# Patient Record
Sex: Male | Born: 1935 | Race: White | Hispanic: No | Marital: Married | Smoking: Former smoker
Health system: Southern US, Community
[De-identification: ages and names within clinical notes are randomized; demographics above are authoritative.]

## PROBLEM LIST (undated history)

## (undated) DIAGNOSIS — I2510 Atherosclerotic heart disease of native coronary artery without angina pectoris: Secondary | ICD-10-CM

## (undated) DIAGNOSIS — E785 Hyperlipidemia, unspecified: Secondary | ICD-10-CM

## (undated) DIAGNOSIS — N184 Chronic kidney disease, stage 4 (severe): Secondary | ICD-10-CM

## (undated) DIAGNOSIS — I10 Essential (primary) hypertension: Secondary | ICD-10-CM

## (undated) DIAGNOSIS — I5043 Acute on chronic combined systolic (congestive) and diastolic (congestive) heart failure: Secondary | ICD-10-CM

## (undated) HISTORY — PX: CATARACT EXTRACTION: SUR2

## (undated) HISTORY — PX: AV FISTULA PLACEMENT, RADIOCEPHALIC: SHX1208

---

## 2001-10-11 ENCOUNTER — Ambulatory Visit (HOSPITAL_COMMUNITY): Admission: RE | Admit: 2001-10-11 | Discharge: 2001-10-11 | Payer: Self-pay | Admitting: Nephrology

## 2001-10-11 ENCOUNTER — Encounter: Payer: Self-pay | Admitting: Nephrology

## 2001-10-26 ENCOUNTER — Ambulatory Visit (HOSPITAL_COMMUNITY): Admission: RE | Admit: 2001-10-26 | Discharge: 2001-10-26 | Payer: Self-pay | Admitting: Nephrology

## 2001-10-26 ENCOUNTER — Encounter: Payer: Self-pay | Admitting: Nephrology

## 2007-04-28 ENCOUNTER — Ambulatory Visit: Payer: Self-pay | Admitting: *Deleted

## 2008-05-03 ENCOUNTER — Ambulatory Visit: Payer: Self-pay | Admitting: *Deleted

## 2008-11-12 ENCOUNTER — Ambulatory Visit: Payer: Self-pay | Admitting: Surgery

## 2008-11-30 ENCOUNTER — Ambulatory Visit: Payer: Self-pay | Admitting: Surgery

## 2008-11-30 ENCOUNTER — Ambulatory Visit (HOSPITAL_COMMUNITY): Admission: RE | Admit: 2008-11-30 | Discharge: 2008-11-30 | Payer: Self-pay | Admitting: Surgery

## 2008-12-24 ENCOUNTER — Ambulatory Visit: Payer: Self-pay | Admitting: Surgery

## 2009-01-28 ENCOUNTER — Ambulatory Visit: Payer: Self-pay | Admitting: Surgery

## 2009-06-19 ENCOUNTER — Ambulatory Visit: Payer: Self-pay | Admitting: Vascular Surgery

## 2010-08-28 ENCOUNTER — Ambulatory Visit
Admission: RE | Admit: 2010-08-28 | Discharge: 2010-08-28 | Payer: Self-pay | Source: Home / Self Care | Attending: Vascular Surgery | Admitting: Vascular Surgery

## 2010-11-26 LAB — POCT I-STAT 4, (NA,K, GLUC, HGB,HCT)
Glucose, Bld: 138 mg/dL — ABNORMAL HIGH (ref 70–99)
HCT: 38 % — ABNORMAL LOW (ref 39.0–52.0)
Hemoglobin: 12.9 g/dL — ABNORMAL LOW (ref 13.0–17.0)
Potassium: 4.3 mEq/L (ref 3.5–5.1)
Sodium: 140 mEq/L (ref 135–145)

## 2010-12-30 NOTE — Assessment & Plan Note (Signed)
OFFICE VISIT   Tanner Pearson, Tanner Pearson  DOB:  [birth year]                                       12/24/2008  [id number]   REASON FOR VISIT:  Follow-up.   HISTORY:  This is a 77 year old gentleman who underwent right  radiocephalic fistula on November 30, 2008.  He is not yet on dialysis.  His postoperative course was uneventful.  He comes in today for  evaluation.  Has an excellent thrill with a prominent vein in his  forearm.  His incision is well healed.  The patient does complain of  some mild numbness in the tips of his lateral 3 digits; this occurs only  at night and is improving.  He also has had some mild amount of pain in  his right wrist.   I think the patient's symptoms may be related to a mild steal; however,  I do not think this is a significant.  I think it will improve with  time.  I have recommend that he do regular exercises.  I am going to see  him back in 4 weeks.  I think this is going to be an excellent access  for him.   Eldridge Abrahams, MD  Electronically Signed   VWB/MEDQ  D:  12/24/2008  T:  12/25/2008  Job:  [id number]   cc:   Elzie Rings. [name], M.D.

## 2010-12-30 NOTE — Procedures (Signed)
DUPLEX ULTRASOUND OF ABDOMINAL AORTA   INDICATION:  Follow up of known infrarenal AAA.   HISTORY:  Diabetes:  No  Cardiac:  No  Hypertension:  Yes  Smoking:  One-half pack per day  Connective Tissue Disorder:  Family History:  No  Previous Surgery:  No   DUPLEX EXAM:         AP (cm)                   TRANSVERSE (cm)  Proximal             2.1 Cm                    2.2 cm  Mid                  2.6 cm                    3 cm  Distal               1.8 cm                    2.3 cm  Right Iliac          1.2 cm                    1.3 cm  Left Iliac           1.2 cm                    1.1 cm   PREVIOUS:  Date: 04/2006  AP:  2.6  TRANSVERSE:  3   IMPRESSION:  1. Stable-appearing fusiform abdominal aortic aneurysm with maximum      diameter of 2.6 cm AP x 3      cm transverse.  There is mild plaquing without significant flow      obstruction.  2. Mild ectatic common iliac arteries.   ___________________________________________  P. Drucie Opitz, M.D.   AR/MEDQ  D:  04/28/2007  T:  04/28/2007  Job:  [id number]

## 2010-12-30 NOTE — Assessment & Plan Note (Signed)
OFFICE VISIT   Tanner Pearson, Tanner Pearson  DOB:  01-Dec-1928                                       11/12/2008  [id number]   REFERRING PHYSICIAN:  Caren Griffins B. [name], M.D.   REASON FOR VISIT:  Dialysis access.   HISTORY:  This is a 77 year old gentleman I am seeing at the request of  Dr. [name] for evaluation of permanent access.  The patient his  predominately left-handed.  He is not yet on dialysis.  Etiology for his  dialysis is multi-factorial including hypertension and dyslipidemia.   The patient has a history of chronic kidney disease, hypertension,  dyslipidemia, abdominal aortic aneurysm, tobacco abuse.   FAMILY HISTORY:  Noncontributory.   SOCIAL HISTORY:  He is married.  Currently smokes half pack a day.   REVIEW OF SYSTEMS:  GENERAL:  Negative fevers, chills, weight gain,  weight loss.  CARDIAC:  Negative.  PULMONARY:  Negative.  GI:  Negative.  GU:  Positive for kidney disease.  VASCULAR:  Negative.  NEURO:  Negative.  ORTHO:  Negative.  PSYCH:  Negative.  ENT:  Negative.  HEME:  Negative.   MEDICATIONS:  Include Hyzaar, amlodipine and Tricor.   ALLERGIES:  None.   PHYSICAL EXAMINATION:  VITAL SIGNS:  Blood pressure is 155/69, pulse 62.  GENERAL:  Well-appearing, no distress.  CARDIOVASCULAR:  Regular rate and rhythm.  RESPIRATORY:  Respirations nonlabored.  EXTREMITIES:  He has palpable right radial pulse.  NEUROLOGIC:  He is intact.   DIAGNOSTIC STUDIES:  Ultrasound was performed today.  He has excellent  right-sided cephalic vein.   ASSESSMENT/PLAN:  Chronic kidney disease needing access.  Patient is a  good candidate for a right wrist fistula.  I have scheduled this for  Friday, April 16.  The risks and benefits were discussed with the  patient and his wife.  All questions were answered.  Again, he will be  scheduled for a right wrist fistula.   Eldridge Abrahams, MD  Electronically Signed   VWB/MEDQ  D:  11/12/2008  T:   11/13/2008  Job:  [id number]   cc:   Elzie Rings. [name], M.D.

## 2010-12-30 NOTE — Procedures (Signed)
DUPLEX ULTRASOUND OF ABDOMINAL AORTA   INDICATION:  Follow-up of abdominal aortic aneurysm.   HISTORY:  Diabetes:  No  Cardiac:  No  Hypertension:  Yes  Smoking:  Yes  Connective Tissue Disorder:  Family History:  No  Previous Surgery:  No   DUPLEX EXAM:         AP (cm)                   TRANSVERSE (cm)  Proximal             1.92 cm                   1.98 cm  Mid                  2.31 cm                   2.15 cm  Distal               2.83 cm                   3.05 cm  Right Iliac          1.54 cm                   1.48 cm  Left Iliac           1.48 cm                   1.52 cm   PREVIOUS:  Date: May 03, 2008  AP:  3.1  TRANSVERSE:  2.9   IMPRESSION:  Stable small abdominal aortic aneurysm with largest  measurement of 2.83 cm X 3.05 cm.   ___________________________________________  Judeth Cornfield. Scot Dock, M.D.   AS/MEDQ  D:  06/19/2009  T:  06/19/2009  Job:  [id number]

## 2010-12-30 NOTE — Procedures (Signed)
DUPLEX ULTRASOUND OF ABDOMINAL AORTA   INDICATION:  Followup abdominal ectasia/aneurysm.   HISTORY:  Diabetes:  No  Cardiac:  No  Hypertension:  Yes  Smoking:  Yes  Connective Tissue Disorder:  Family History:  Previous Surgery:  No   DUPLEX EXAM:         AP (cm)                   TRANSVERSE (cm)  Proximal             2.37 x 2.3 cm             cm  Mid                  2.8 x 2.9 cm              cm  Distal               1.73 x 1.76 cm            cm  Right Iliac          0.98 x 1.1 cm             cm  Left Iliac           1.3 x 1.2 cm              cm   PREVIOUS:  Date: June 19, 2009  AP:  2.83 x 3.05  TRANSVERSE:   IMPRESSION:  Evidence of stable mildly ectatic abdominal aortic.   ___________________________________________  Judeth Cornfield. Scot Dock, M.D.   LT/MEDQ  D:  08/28/2010  T:  08/28/2010  Job:  [id number]

## 2010-12-30 NOTE — Assessment & Plan Note (Signed)
OFFICE VISIT   Tanner Pearson, Tanner Pearson  DOB:  [birth year]                                       01/28/2009  [id number]   REASON FOR VISIT:  Followup.   HISTORY:  This is a 77 year old gentleman who is status post right  radiocephalic fistula on November 30, 2008.  He comes in today for  followup.  He had a mild steal syndrome with numbness in the lateral 3  digits; however, this is completely resolved.  He has an excellent  thrill and a nicely-dilated vein.  This will be an excellent access for  him should he require dialysis.  I will not plan on seeing him back in  the office.   Eldridge Abrahams, MD  Electronically Signed   VWB/MEDQ  D:  01/27/2009  T:  01/29/2009  Job:  [id number]

## 2010-12-30 NOTE — Op Note (Signed)
NAME:  Tanner Pearson, Tanner Pearson                ACCOUNT NO.:  [id number]   MEDICAL RECORD NO.:  [id number]          PATIENT TYPE:  AMB   LOCATION:  SDS                          FACILITY:  [facility]   PHYSICIAN:  Theotis Burrow IV, MDDATE OF BIRTH:  May 03, 1929   DATE OF PROCEDURE:  11/30/2008  DATE OF DISCHARGE:                               OPERATIVE REPORT   PREOPERATIVE DIAGNOSIS:  Chronic kidney disease.   POSTOPERATIVE DIAGNOSIS:  Chronic kidney disease.   PROCEDURE PERFORMED:  Right radiocephalic fistula ([place]).   SURGEON:  1. Annamarie Major IV, MD   ANESTHESIA:  General.   ESTIMATED BLOOD LOSS:  Minimal.   FINDINGS:  Small artery.   INDICATIONS:  This is a 77 year old gentleman with chronic kidney  disease comes in today for dialysis access.   PROCEDURE IN DETAIL:  The patient was identified in the holding area,  taken to room 8, and he was placed supine on the table.  General  anesthesia was administered.  The patient was prepped and draped in the  standard sterile fashion.  Time-out was called.  Antibiotics were given.  Ultrasound was used to map the course of the cephalic vein via the upper  arm.  It appeared to be of adequate diameter.  A longitudinal incision  was made between the cephalic vein and the radial artery.  Cephalic vein  was identified within the incision, skeletonized and encircled with  vessel loop.  This was mobilized proximally and distally.  The side  branches were divided between 3-0 silk ties.  Once the adequate length  of the vein was obtained, it was marked with an ink pen to maintain a  proper orientation.  Next, the radial artery was identified within the  incision.  The radial artery was very small in caliber, approximately  1.5-2 mm.  Once the radial artery had been adequately exposed, the  patient was given 3000 units of heparin.  The vein was ligated on this  distal limb with 2-0 silk tie.  It was opened and spatulated.  The vein  accepted up to a  3.5 dilator.  A #11 blade was used make an arteriotomy  and was extended with Potts scissors.  An end-to-side anastomosis was  created using a running 7-0 Prolene.  Prior to completion, the artery  was flushed in antegrade and retrograde fashion.  The anastomosis was  then secured.  I was not very happy with the thrill within the fistula.  It did  have an adequate sounding Doppler signal up through the elbow.  The  patient was hypotensive throughout the case.  I have elected to observe  this every time since he is not on dialysis.  Hopefully, this will end  up maturing.  If it occludes early, it would need to be converted to an  upper arm fistula.  The skin was closed with 3-0 and 4-0 Vicryl.      Eldridge Abrahams, MD  Electronically Signed     VWB/MEDQ  D:  11/30/2008  T:  12/01/2008  Job:  [id number]

## 2010-12-30 NOTE — Procedures (Signed)
CEPHALIC VEIN MAPPING   INDICATION:  Preop evaluation for possible AV fistula placement.   HISTORY:  Chronic kidney disease.   EXAM:  The right cephalic vein is compressible.   Diameter measurements range from 0.3-0.41 cm.   The left cephalic vein is compressible.   Diameter measurements range from 0.2-0.65 cm.   See attached worksheet for all measurements.   IMPRESSION:  Patent bilateral cephalic veins with diameter measurements  as described above and on the attached worksheet.   ___________________________________________  V. Leia Alf, MD   CH/MEDQ  D:  11/12/2008  T:  11/12/2008  Job:  [id number]

## 2010-12-30 NOTE — Procedures (Signed)
DUPLEX ULTRASOUND OF ABDOMINAL AORTA   INDICATION:  Follow-up evaluation of known abdominal aortic aneurysm.   HISTORY:  Diabetes:  No.  Cardiac:  No.  Hypertension:  Yes.  Smoking:  A half pack per day.  Connective Tissue Disorder:  Family History:  No.  Previous Surgery:  No.   DUPLEX EXAM:         AP (cm)                   TRANSVERSE (cm)  Proximal             1.7 Cm                    1.6 cm  Mid                  3.1 cm                    2.9 cm  Distal               2.1 cm                    2.3 cm  Right Iliac          1.0 cm                    1.1 cm  Left Iliac           1.0 cm                    1.1 cm   PREVIOUS:  Date: 04/28/07  AP:  2.6  TRANSVERSE:  3.0   IMPRESSION:  Abdominal aortic aneurysm measurements are stable from  previous study.   ___________________________________________  P. Drucie Opitz, M.D.   MC/MEDQ  D:  05/03/2008  T:  05/03/2008  Job:  [id number]

## 2011-08-27 DIAGNOSIS — R197 Diarrhea, unspecified: Secondary | ICD-10-CM | POA: Diagnosis not present

## 2011-08-28 DIAGNOSIS — R197 Diarrhea, unspecified: Secondary | ICD-10-CM | POA: Diagnosis not present

## 2011-09-02 DIAGNOSIS — N19 Unspecified kidney failure: Secondary | ICD-10-CM | POA: Diagnosis not present

## 2011-09-07 DIAGNOSIS — N184 Chronic kidney disease, stage 4 (severe): Secondary | ICD-10-CM | POA: Diagnosis not present

## 2011-10-01 DIAGNOSIS — N184 Chronic kidney disease, stage 4 (severe): Secondary | ICD-10-CM | POA: Diagnosis not present

## 2011-10-01 DIAGNOSIS — E559 Vitamin D deficiency, unspecified: Secondary | ICD-10-CM | POA: Diagnosis not present

## 2011-10-01 DIAGNOSIS — M109 Gout, unspecified: Secondary | ICD-10-CM | POA: Diagnosis not present

## 2011-10-01 DIAGNOSIS — I129 Hypertensive chronic kidney disease with stage 1 through stage 4 chronic kidney disease, or unspecified chronic kidney disease: Secondary | ICD-10-CM | POA: Diagnosis not present

## 2011-10-29 DIAGNOSIS — K219 Gastro-esophageal reflux disease without esophagitis: Secondary | ICD-10-CM | POA: Diagnosis not present

## 2011-11-17 DIAGNOSIS — A048 Other specified bacterial intestinal infections: Secondary | ICD-10-CM | POA: Diagnosis not present

## 2011-11-18 DIAGNOSIS — A048 Other specified bacterial intestinal infections: Secondary | ICD-10-CM | POA: Diagnosis not present

## 2011-11-25 DIAGNOSIS — R634 Abnormal weight loss: Secondary | ICD-10-CM | POA: Diagnosis not present

## 2011-11-25 DIAGNOSIS — A048 Other specified bacterial intestinal infections: Secondary | ICD-10-CM | POA: Diagnosis not present

## 2011-11-25 DIAGNOSIS — R1013 Epigastric pain: Secondary | ICD-10-CM | POA: Diagnosis not present

## 2011-11-25 DIAGNOSIS — R12 Heartburn: Secondary | ICD-10-CM | POA: Diagnosis not present

## 2011-12-03 DIAGNOSIS — K319 Disease of stomach and duodenum, unspecified: Secondary | ICD-10-CM | POA: Diagnosis not present

## 2011-12-03 DIAGNOSIS — A048 Other specified bacterial intestinal infections: Secondary | ICD-10-CM | POA: Diagnosis not present

## 2011-12-03 DIAGNOSIS — K2940 Chronic atrophic gastritis without bleeding: Secondary | ICD-10-CM | POA: Diagnosis not present

## 2011-12-03 DIAGNOSIS — R1013 Epigastric pain: Secondary | ICD-10-CM | POA: Diagnosis not present

## 2011-12-03 DIAGNOSIS — K219 Gastro-esophageal reflux disease without esophagitis: Secondary | ICD-10-CM | POA: Diagnosis not present

## 2011-12-29 DIAGNOSIS — N184 Chronic kidney disease, stage 4 (severe): Secondary | ICD-10-CM | POA: Diagnosis not present

## 2011-12-29 DIAGNOSIS — I129 Hypertensive chronic kidney disease with stage 1 through stage 4 chronic kidney disease, or unspecified chronic kidney disease: Secondary | ICD-10-CM | POA: Diagnosis not present

## 2011-12-29 DIAGNOSIS — D649 Anemia, unspecified: Secondary | ICD-10-CM | POA: Diagnosis not present

## 2011-12-29 DIAGNOSIS — N2581 Secondary hyperparathyroidism of renal origin: Secondary | ICD-10-CM | POA: Diagnosis not present

## 2011-12-29 DIAGNOSIS — I10 Essential (primary) hypertension: Secondary | ICD-10-CM | POA: Diagnosis not present

## 2012-01-12 DIAGNOSIS — E785 Hyperlipidemia, unspecified: Secondary | ICD-10-CM | POA: Diagnosis not present

## 2012-01-12 DIAGNOSIS — I10 Essential (primary) hypertension: Secondary | ICD-10-CM | POA: Diagnosis not present

## 2012-01-12 DIAGNOSIS — K219 Gastro-esophageal reflux disease without esophagitis: Secondary | ICD-10-CM | POA: Diagnosis not present

## 2012-01-12 DIAGNOSIS — R142 Eructation: Secondary | ICD-10-CM | POA: Diagnosis not present

## 2012-01-12 DIAGNOSIS — Z79899 Other long term (current) drug therapy: Secondary | ICD-10-CM | POA: Diagnosis not present

## 2012-01-12 DIAGNOSIS — R143 Flatulence: Secondary | ICD-10-CM | POA: Diagnosis not present

## 2012-03-01 DIAGNOSIS — N2581 Secondary hyperparathyroidism of renal origin: Secondary | ICD-10-CM | POA: Diagnosis not present

## 2012-03-01 DIAGNOSIS — N184 Chronic kidney disease, stage 4 (severe): Secondary | ICD-10-CM | POA: Diagnosis not present

## 2012-03-01 DIAGNOSIS — M109 Gout, unspecified: Secondary | ICD-10-CM | POA: Diagnosis not present

## 2012-03-01 DIAGNOSIS — I129 Hypertensive chronic kidney disease with stage 1 through stage 4 chronic kidney disease, or unspecified chronic kidney disease: Secondary | ICD-10-CM | POA: Diagnosis not present

## 2012-03-01 DIAGNOSIS — D649 Anemia, unspecified: Secondary | ICD-10-CM | POA: Diagnosis not present

## 2012-03-02 DIAGNOSIS — L570 Actinic keratosis: Secondary | ICD-10-CM | POA: Diagnosis not present

## 2012-03-02 DIAGNOSIS — L821 Other seborrheic keratosis: Secondary | ICD-10-CM | POA: Diagnosis not present

## 2012-03-02 DIAGNOSIS — L578 Other skin changes due to chronic exposure to nonionizing radiation: Secondary | ICD-10-CM | POA: Diagnosis not present

## 2012-04-22 DIAGNOSIS — Z23 Encounter for immunization: Secondary | ICD-10-CM | POA: Diagnosis not present

## 2012-06-15 DIAGNOSIS — I129 Hypertensive chronic kidney disease with stage 1 through stage 4 chronic kidney disease, or unspecified chronic kidney disease: Secondary | ICD-10-CM | POA: Diagnosis not present

## 2012-06-15 DIAGNOSIS — N184 Chronic kidney disease, stage 4 (severe): Secondary | ICD-10-CM | POA: Diagnosis not present

## 2012-06-15 DIAGNOSIS — I10 Essential (primary) hypertension: Secondary | ICD-10-CM | POA: Diagnosis not present

## 2012-06-15 DIAGNOSIS — M109 Gout, unspecified: Secondary | ICD-10-CM | POA: Diagnosis not present

## 2012-06-15 DIAGNOSIS — E785 Hyperlipidemia, unspecified: Secondary | ICD-10-CM | POA: Diagnosis not present

## 2012-07-04 DIAGNOSIS — E785 Hyperlipidemia, unspecified: Secondary | ICD-10-CM | POA: Diagnosis not present

## 2012-07-04 DIAGNOSIS — M549 Dorsalgia, unspecified: Secondary | ICD-10-CM | POA: Diagnosis not present

## 2012-07-04 DIAGNOSIS — K219 Gastro-esophageal reflux disease without esophagitis: Secondary | ICD-10-CM | POA: Diagnosis not present

## 2012-09-01 DIAGNOSIS — I10 Essential (primary) hypertension: Secondary | ICD-10-CM | POA: Diagnosis not present

## 2012-09-01 DIAGNOSIS — E785 Hyperlipidemia, unspecified: Secondary | ICD-10-CM | POA: Diagnosis not present

## 2012-09-01 DIAGNOSIS — N184 Chronic kidney disease, stage 4 (severe): Secondary | ICD-10-CM | POA: Diagnosis not present

## 2012-09-08 DIAGNOSIS — N184 Chronic kidney disease, stage 4 (severe): Secondary | ICD-10-CM | POA: Diagnosis not present

## 2012-10-22 DIAGNOSIS — R7989 Other specified abnormal findings of blood chemistry: Secondary | ICD-10-CM | POA: Diagnosis not present

## 2012-10-22 DIAGNOSIS — R0602 Shortness of breath: Secondary | ICD-10-CM | POA: Diagnosis not present

## 2012-10-22 DIAGNOSIS — I509 Heart failure, unspecified: Secondary | ICD-10-CM | POA: Diagnosis not present

## 2012-10-22 DIAGNOSIS — R748 Abnormal levels of other serum enzymes: Secondary | ICD-10-CM | POA: Diagnosis not present

## 2012-10-22 DIAGNOSIS — Q613 Polycystic kidney, unspecified: Secondary | ICD-10-CM | POA: Diagnosis not present

## 2012-10-22 DIAGNOSIS — E7800 Pure hypercholesterolemia, unspecified: Secondary | ICD-10-CM | POA: Diagnosis not present

## 2012-10-22 DIAGNOSIS — R0609 Other forms of dyspnea: Secondary | ICD-10-CM | POA: Diagnosis not present

## 2012-10-22 DIAGNOSIS — R51 Headache: Secondary | ICD-10-CM | POA: Diagnosis not present

## 2012-10-22 DIAGNOSIS — M109 Gout, unspecified: Secondary | ICD-10-CM | POA: Diagnosis not present

## 2012-10-22 DIAGNOSIS — Z79899 Other long term (current) drug therapy: Secondary | ICD-10-CM | POA: Diagnosis not present

## 2012-10-22 DIAGNOSIS — R0789 Other chest pain: Secondary | ICD-10-CM | POA: Diagnosis not present

## 2012-10-22 DIAGNOSIS — N189 Chronic kidney disease, unspecified: Secondary | ICD-10-CM | POA: Diagnosis not present

## 2012-10-22 DIAGNOSIS — I129 Hypertensive chronic kidney disease with stage 1 through stage 4 chronic kidney disease, or unspecified chronic kidney disease: Secondary | ICD-10-CM | POA: Diagnosis not present

## 2012-10-22 DIAGNOSIS — R7402 Elevation of levels of lactic acid dehydrogenase (LDH): Secondary | ICD-10-CM | POA: Diagnosis not present

## 2012-10-22 DIAGNOSIS — I428 Other cardiomyopathies: Secondary | ICD-10-CM | POA: Diagnosis not present

## 2012-10-22 DIAGNOSIS — I501 Left ventricular failure: Secondary | ICD-10-CM | POA: Diagnosis not present

## 2012-10-22 DIAGNOSIS — I359 Nonrheumatic aortic valve disorder, unspecified: Secondary | ICD-10-CM | POA: Diagnosis not present

## 2012-10-22 DIAGNOSIS — J9600 Acute respiratory failure, unspecified whether with hypoxia or hypercapnia: Secondary | ICD-10-CM | POA: Diagnosis not present

## 2012-10-27 DIAGNOSIS — N19 Unspecified kidney failure: Secondary | ICD-10-CM | POA: Diagnosis not present

## 2012-10-27 DIAGNOSIS — I10 Essential (primary) hypertension: Secondary | ICD-10-CM | POA: Diagnosis not present

## 2012-10-27 DIAGNOSIS — E785 Hyperlipidemia, unspecified: Secondary | ICD-10-CM | POA: Diagnosis not present

## 2012-10-27 DIAGNOSIS — I509 Heart failure, unspecified: Secondary | ICD-10-CM | POA: Diagnosis not present

## 2012-11-02 DIAGNOSIS — Z0000 Encounter for general adult medical examination without abnormal findings: Secondary | ICD-10-CM | POA: Diagnosis not present

## 2012-11-07 DIAGNOSIS — N189 Chronic kidney disease, unspecified: Secondary | ICD-10-CM | POA: Diagnosis not present

## 2012-11-09 DIAGNOSIS — Z87891 Personal history of nicotine dependence: Secondary | ICD-10-CM | POA: Diagnosis not present

## 2012-11-09 DIAGNOSIS — E785 Hyperlipidemia, unspecified: Secondary | ICD-10-CM | POA: Diagnosis not present

## 2012-11-09 DIAGNOSIS — Q613 Polycystic kidney, unspecified: Secondary | ICD-10-CM | POA: Diagnosis not present

## 2012-11-09 DIAGNOSIS — Z823 Family history of stroke: Secondary | ICD-10-CM | POA: Diagnosis not present

## 2012-11-09 DIAGNOSIS — I428 Other cardiomyopathies: Secondary | ICD-10-CM | POA: Diagnosis not present

## 2012-11-09 DIAGNOSIS — M109 Gout, unspecified: Secondary | ICD-10-CM | POA: Diagnosis not present

## 2012-11-09 DIAGNOSIS — N189 Chronic kidney disease, unspecified: Secondary | ICD-10-CM | POA: Diagnosis not present

## 2012-11-09 DIAGNOSIS — Z8249 Family history of ischemic heart disease and other diseases of the circulatory system: Secondary | ICD-10-CM | POA: Diagnosis not present

## 2012-11-16 DIAGNOSIS — N189 Chronic kidney disease, unspecified: Secondary | ICD-10-CM | POA: Diagnosis not present

## 2012-11-16 DIAGNOSIS — I10 Essential (primary) hypertension: Secondary | ICD-10-CM | POA: Diagnosis not present

## 2012-11-16 DIAGNOSIS — I428 Other cardiomyopathies: Secondary | ICD-10-CM | POA: Diagnosis not present

## 2012-11-16 DIAGNOSIS — Z0000 Encounter for general adult medical examination without abnormal findings: Secondary | ICD-10-CM | POA: Diagnosis not present

## 2012-11-16 DIAGNOSIS — E785 Hyperlipidemia, unspecified: Secondary | ICD-10-CM | POA: Diagnosis not present

## 2012-11-21 DIAGNOSIS — H60509 Unspecified acute noninfective otitis externa, unspecified ear: Secondary | ICD-10-CM | POA: Diagnosis not present

## 2012-12-07 DIAGNOSIS — E785 Hyperlipidemia, unspecified: Secondary | ICD-10-CM | POA: Diagnosis not present

## 2012-12-07 DIAGNOSIS — D649 Anemia, unspecified: Secondary | ICD-10-CM | POA: Diagnosis not present

## 2012-12-07 DIAGNOSIS — N2581 Secondary hyperparathyroidism of renal origin: Secondary | ICD-10-CM | POA: Diagnosis not present

## 2012-12-07 DIAGNOSIS — M109 Gout, unspecified: Secondary | ICD-10-CM | POA: Diagnosis not present

## 2012-12-07 DIAGNOSIS — I129 Hypertensive chronic kidney disease with stage 1 through stage 4 chronic kidney disease, or unspecified chronic kidney disease: Secondary | ICD-10-CM | POA: Diagnosis not present

## 2012-12-07 DIAGNOSIS — I10 Essential (primary) hypertension: Secondary | ICD-10-CM | POA: Diagnosis not present

## 2012-12-07 DIAGNOSIS — N184 Chronic kidney disease, stage 4 (severe): Secondary | ICD-10-CM | POA: Diagnosis not present

## 2012-12-08 DIAGNOSIS — K219 Gastro-esophageal reflux disease without esophagitis: Secondary | ICD-10-CM | POA: Diagnosis not present

## 2012-12-08 DIAGNOSIS — E785 Hyperlipidemia, unspecified: Secondary | ICD-10-CM | POA: Diagnosis not present

## 2012-12-08 DIAGNOSIS — I509 Heart failure, unspecified: Secondary | ICD-10-CM | POA: Diagnosis not present

## 2012-12-08 DIAGNOSIS — I10 Essential (primary) hypertension: Secondary | ICD-10-CM | POA: Diagnosis not present

## 2012-12-08 DIAGNOSIS — Q613 Polycystic kidney, unspecified: Secondary | ICD-10-CM | POA: Diagnosis not present

## 2012-12-08 DIAGNOSIS — M109 Gout, unspecified: Secondary | ICD-10-CM | POA: Diagnosis not present

## 2012-12-08 DIAGNOSIS — I428 Other cardiomyopathies: Secondary | ICD-10-CM | POA: Diagnosis not present

## 2012-12-08 DIAGNOSIS — N184 Chronic kidney disease, stage 4 (severe): Secondary | ICD-10-CM | POA: Diagnosis not present

## 2012-12-08 DIAGNOSIS — N189 Chronic kidney disease, unspecified: Secondary | ICD-10-CM | POA: Diagnosis not present

## 2012-12-15 DIAGNOSIS — N189 Chronic kidney disease, unspecified: Secondary | ICD-10-CM | POA: Diagnosis not present

## 2012-12-15 DIAGNOSIS — Z0000 Encounter for general adult medical examination without abnormal findings: Secondary | ICD-10-CM | POA: Diagnosis not present

## 2012-12-15 DIAGNOSIS — E785 Hyperlipidemia, unspecified: Secondary | ICD-10-CM | POA: Diagnosis not present

## 2012-12-15 DIAGNOSIS — I10 Essential (primary) hypertension: Secondary | ICD-10-CM | POA: Diagnosis not present

## 2013-01-23 DIAGNOSIS — M79609 Pain in unspecified limb: Secondary | ICD-10-CM | POA: Diagnosis not present

## 2013-01-23 DIAGNOSIS — R5381 Other malaise: Secondary | ICD-10-CM | POA: Diagnosis not present

## 2013-01-23 DIAGNOSIS — M62838 Other muscle spasm: Secondary | ICD-10-CM | POA: Diagnosis not present

## 2013-01-24 DIAGNOSIS — M62838 Other muscle spasm: Secondary | ICD-10-CM | POA: Diagnosis not present

## 2013-01-24 DIAGNOSIS — Z79899 Other long term (current) drug therapy: Secondary | ICD-10-CM | POA: Diagnosis not present

## 2013-01-24 DIAGNOSIS — N184 Chronic kidney disease, stage 4 (severe): Secondary | ICD-10-CM | POA: Diagnosis not present

## 2013-01-24 DIAGNOSIS — E785 Hyperlipidemia, unspecified: Secondary | ICD-10-CM | POA: Diagnosis not present

## 2013-01-24 DIAGNOSIS — R5381 Other malaise: Secondary | ICD-10-CM | POA: Diagnosis not present

## 2013-02-13 DIAGNOSIS — I10 Essential (primary) hypertension: Secondary | ICD-10-CM | POA: Diagnosis not present

## 2013-02-13 DIAGNOSIS — E785 Hyperlipidemia, unspecified: Secondary | ICD-10-CM | POA: Diagnosis not present

## 2013-02-13 DIAGNOSIS — I428 Other cardiomyopathies: Secondary | ICD-10-CM | POA: Diagnosis not present

## 2013-02-13 DIAGNOSIS — N189 Chronic kidney disease, unspecified: Secondary | ICD-10-CM | POA: Diagnosis not present

## 2013-02-14 DIAGNOSIS — D649 Anemia, unspecified: Secondary | ICD-10-CM | POA: Diagnosis not present

## 2013-02-14 DIAGNOSIS — N2581 Secondary hyperparathyroidism of renal origin: Secondary | ICD-10-CM | POA: Diagnosis not present

## 2013-02-14 DIAGNOSIS — N184 Chronic kidney disease, stage 4 (severe): Secondary | ICD-10-CM | POA: Diagnosis not present

## 2013-02-20 DIAGNOSIS — N184 Chronic kidney disease, stage 4 (severe): Secondary | ICD-10-CM | POA: Diagnosis not present

## 2013-02-20 DIAGNOSIS — D638 Anemia in other chronic diseases classified elsewhere: Secondary | ICD-10-CM | POA: Diagnosis not present

## 2013-03-20 DIAGNOSIS — D638 Anemia in other chronic diseases classified elsewhere: Secondary | ICD-10-CM | POA: Diagnosis not present

## 2013-03-20 DIAGNOSIS — N184 Chronic kidney disease, stage 4 (severe): Secondary | ICD-10-CM | POA: Diagnosis not present

## 2013-03-30 DIAGNOSIS — N184 Chronic kidney disease, stage 4 (severe): Secondary | ICD-10-CM | POA: Diagnosis not present

## 2013-03-30 DIAGNOSIS — N2581 Secondary hyperparathyroidism of renal origin: Secondary | ICD-10-CM | POA: Diagnosis not present

## 2013-03-30 DIAGNOSIS — I129 Hypertensive chronic kidney disease with stage 1 through stage 4 chronic kidney disease, or unspecified chronic kidney disease: Secondary | ICD-10-CM | POA: Diagnosis not present

## 2013-03-30 DIAGNOSIS — D649 Anemia, unspecified: Secondary | ICD-10-CM | POA: Diagnosis not present

## 2013-04-21 DIAGNOSIS — D638 Anemia in other chronic diseases classified elsewhere: Secondary | ICD-10-CM | POA: Diagnosis not present

## 2013-04-21 DIAGNOSIS — N184 Chronic kidney disease, stage 4 (severe): Secondary | ICD-10-CM | POA: Diagnosis not present

## 2013-05-19 DIAGNOSIS — D638 Anemia in other chronic diseases classified elsewhere: Secondary | ICD-10-CM | POA: Diagnosis not present

## 2013-05-19 DIAGNOSIS — N184 Chronic kidney disease, stage 4 (severe): Secondary | ICD-10-CM | POA: Diagnosis not present

## 2013-05-24 DIAGNOSIS — M109 Gout, unspecified: Secondary | ICD-10-CM | POA: Diagnosis not present

## 2013-05-24 DIAGNOSIS — N184 Chronic kidney disease, stage 4 (severe): Secondary | ICD-10-CM | POA: Diagnosis not present

## 2013-05-24 DIAGNOSIS — N2581 Secondary hyperparathyroidism of renal origin: Secondary | ICD-10-CM | POA: Diagnosis not present

## 2013-05-24 DIAGNOSIS — D649 Anemia, unspecified: Secondary | ICD-10-CM | POA: Diagnosis not present

## 2013-06-12 DIAGNOSIS — K219 Gastro-esophageal reflux disease without esophagitis: Secondary | ICD-10-CM | POA: Diagnosis not present

## 2013-06-12 DIAGNOSIS — I10 Essential (primary) hypertension: Secondary | ICD-10-CM | POA: Diagnosis not present

## 2013-06-12 DIAGNOSIS — I509 Heart failure, unspecified: Secondary | ICD-10-CM | POA: Diagnosis not present

## 2013-06-12 DIAGNOSIS — E785 Hyperlipidemia, unspecified: Secondary | ICD-10-CM | POA: Diagnosis not present

## 2013-06-12 DIAGNOSIS — N184 Chronic kidney disease, stage 4 (severe): Secondary | ICD-10-CM | POA: Diagnosis not present

## 2013-06-13 DIAGNOSIS — N189 Chronic kidney disease, unspecified: Secondary | ICD-10-CM | POA: Diagnosis not present

## 2013-06-13 DIAGNOSIS — I428 Other cardiomyopathies: Secondary | ICD-10-CM | POA: Diagnosis not present

## 2013-06-13 DIAGNOSIS — E785 Hyperlipidemia, unspecified: Secondary | ICD-10-CM | POA: Diagnosis not present

## 2013-06-26 DIAGNOSIS — I428 Other cardiomyopathies: Secondary | ICD-10-CM | POA: Diagnosis not present

## 2013-07-19 DIAGNOSIS — Z23 Encounter for immunization: Secondary | ICD-10-CM | POA: Diagnosis not present

## 2013-07-24 DIAGNOSIS — M109 Gout, unspecified: Secondary | ICD-10-CM | POA: Diagnosis not present

## 2013-07-24 DIAGNOSIS — N2581 Secondary hyperparathyroidism of renal origin: Secondary | ICD-10-CM | POA: Diagnosis not present

## 2013-07-24 DIAGNOSIS — D649 Anemia, unspecified: Secondary | ICD-10-CM | POA: Diagnosis not present

## 2013-07-24 DIAGNOSIS — I129 Hypertensive chronic kidney disease with stage 1 through stage 4 chronic kidney disease, or unspecified chronic kidney disease: Secondary | ICD-10-CM | POA: Diagnosis not present

## 2013-07-24 DIAGNOSIS — N184 Chronic kidney disease, stage 4 (severe): Secondary | ICD-10-CM | POA: Diagnosis not present

## 2013-08-03 DIAGNOSIS — D638 Anemia in other chronic diseases classified elsewhere: Secondary | ICD-10-CM | POA: Diagnosis not present

## 2013-08-03 DIAGNOSIS — N184 Chronic kidney disease, stage 4 (severe): Secondary | ICD-10-CM | POA: Diagnosis not present

## 2013-08-31 DIAGNOSIS — N184 Chronic kidney disease, stage 4 (severe): Secondary | ICD-10-CM | POA: Diagnosis not present

## 2013-08-31 DIAGNOSIS — D638 Anemia in other chronic diseases classified elsewhere: Secondary | ICD-10-CM | POA: Diagnosis not present

## 2013-09-14 DIAGNOSIS — D638 Anemia in other chronic diseases classified elsewhere: Secondary | ICD-10-CM | POA: Diagnosis not present

## 2013-09-14 DIAGNOSIS — N184 Chronic kidney disease, stage 4 (severe): Secondary | ICD-10-CM | POA: Diagnosis not present

## 2013-09-19 DIAGNOSIS — N184 Chronic kidney disease, stage 4 (severe): Secondary | ICD-10-CM | POA: Diagnosis not present

## 2013-09-19 DIAGNOSIS — N2581 Secondary hyperparathyroidism of renal origin: Secondary | ICD-10-CM | POA: Diagnosis not present

## 2013-09-19 DIAGNOSIS — D649 Anemia, unspecified: Secondary | ICD-10-CM | POA: Diagnosis not present

## 2013-09-19 DIAGNOSIS — M109 Gout, unspecified: Secondary | ICD-10-CM | POA: Diagnosis not present

## 2013-09-19 DIAGNOSIS — I129 Hypertensive chronic kidney disease with stage 1 through stage 4 chronic kidney disease, or unspecified chronic kidney disease: Secondary | ICD-10-CM | POA: Diagnosis not present

## 2013-10-06 DIAGNOSIS — D638 Anemia in other chronic diseases classified elsewhere: Secondary | ICD-10-CM | POA: Diagnosis not present

## 2013-10-06 DIAGNOSIS — N184 Chronic kidney disease, stage 4 (severe): Secondary | ICD-10-CM | POA: Diagnosis not present

## 2013-11-03 DIAGNOSIS — D638 Anemia in other chronic diseases classified elsewhere: Secondary | ICD-10-CM | POA: Diagnosis not present

## 2013-11-03 DIAGNOSIS — N184 Chronic kidney disease, stage 4 (severe): Secondary | ICD-10-CM | POA: Diagnosis not present

## 2013-12-12 DIAGNOSIS — I428 Other cardiomyopathies: Secondary | ICD-10-CM | POA: Diagnosis not present

## 2013-12-12 DIAGNOSIS — I10 Essential (primary) hypertension: Secondary | ICD-10-CM | POA: Diagnosis not present

## 2013-12-12 DIAGNOSIS — E785 Hyperlipidemia, unspecified: Secondary | ICD-10-CM | POA: Diagnosis not present

## 2013-12-12 DIAGNOSIS — N189 Chronic kidney disease, unspecified: Secondary | ICD-10-CM | POA: Diagnosis not present

## 2013-12-18 DIAGNOSIS — M109 Gout, unspecified: Secondary | ICD-10-CM | POA: Diagnosis not present

## 2013-12-18 DIAGNOSIS — I770 Arteriovenous fistula, acquired: Secondary | ICD-10-CM | POA: Diagnosis not present

## 2013-12-18 DIAGNOSIS — N2581 Secondary hyperparathyroidism of renal origin: Secondary | ICD-10-CM | POA: Diagnosis not present

## 2013-12-18 DIAGNOSIS — N184 Chronic kidney disease, stage 4 (severe): Secondary | ICD-10-CM | POA: Diagnosis not present

## 2013-12-18 DIAGNOSIS — D649 Anemia, unspecified: Secondary | ICD-10-CM | POA: Diagnosis not present

## 2013-12-18 DIAGNOSIS — I129 Hypertensive chronic kidney disease with stage 1 through stage 4 chronic kidney disease, or unspecified chronic kidney disease: Secondary | ICD-10-CM | POA: Diagnosis not present

## 2013-12-21 DIAGNOSIS — D638 Anemia in other chronic diseases classified elsewhere: Secondary | ICD-10-CM | POA: Diagnosis not present

## 2013-12-21 DIAGNOSIS — N184 Chronic kidney disease, stage 4 (severe): Secondary | ICD-10-CM | POA: Diagnosis not present

## 2014-01-18 DIAGNOSIS — N184 Chronic kidney disease, stage 4 (severe): Secondary | ICD-10-CM | POA: Diagnosis not present

## 2014-01-18 DIAGNOSIS — D638 Anemia in other chronic diseases classified elsewhere: Secondary | ICD-10-CM | POA: Diagnosis not present

## 2014-01-30 DIAGNOSIS — I498 Other specified cardiac arrhythmias: Secondary | ICD-10-CM | POA: Diagnosis not present

## 2014-01-30 DIAGNOSIS — Z79899 Other long term (current) drug therapy: Secondary | ICD-10-CM | POA: Diagnosis not present

## 2014-01-30 DIAGNOSIS — I428 Other cardiomyopathies: Secondary | ICD-10-CM | POA: Diagnosis not present

## 2014-01-30 DIAGNOSIS — E785 Hyperlipidemia, unspecified: Secondary | ICD-10-CM | POA: Diagnosis not present

## 2014-01-30 DIAGNOSIS — M542 Cervicalgia: Secondary | ICD-10-CM | POA: Diagnosis not present

## 2014-01-30 DIAGNOSIS — R5381 Other malaise: Secondary | ICD-10-CM | POA: Diagnosis not present

## 2014-01-30 DIAGNOSIS — D649 Anemia, unspecified: Secondary | ICD-10-CM | POA: Diagnosis not present

## 2014-01-30 DIAGNOSIS — R5383 Other fatigue: Secondary | ICD-10-CM | POA: Diagnosis not present

## 2014-01-30 DIAGNOSIS — M6281 Muscle weakness (generalized): Secondary | ICD-10-CM | POA: Diagnosis not present

## 2014-01-31 DIAGNOSIS — M47812 Spondylosis without myelopathy or radiculopathy, cervical region: Secondary | ICD-10-CM | POA: Diagnosis not present

## 2014-01-31 DIAGNOSIS — M5030 Other cervical disc degeneration, unspecified cervical region: Secondary | ICD-10-CM | POA: Diagnosis not present

## 2014-02-19 DIAGNOSIS — D638 Anemia in other chronic diseases classified elsewhere: Secondary | ICD-10-CM | POA: Diagnosis not present

## 2014-02-19 DIAGNOSIS — N184 Chronic kidney disease, stage 4 (severe): Secondary | ICD-10-CM | POA: Diagnosis not present

## 2014-02-20 DIAGNOSIS — M5030 Other cervical disc degeneration, unspecified cervical region: Secondary | ICD-10-CM | POA: Diagnosis not present

## 2014-02-26 DIAGNOSIS — D649 Anemia, unspecified: Secondary | ICD-10-CM | POA: Diagnosis not present

## 2014-02-26 DIAGNOSIS — N2581 Secondary hyperparathyroidism of renal origin: Secondary | ICD-10-CM | POA: Diagnosis not present

## 2014-02-26 DIAGNOSIS — N184 Chronic kidney disease, stage 4 (severe): Secondary | ICD-10-CM | POA: Diagnosis not present

## 2014-02-26 DIAGNOSIS — I129 Hypertensive chronic kidney disease with stage 1 through stage 4 chronic kidney disease, or unspecified chronic kidney disease: Secondary | ICD-10-CM | POA: Diagnosis not present

## 2014-02-26 DIAGNOSIS — M109 Gout, unspecified: Secondary | ICD-10-CM | POA: Diagnosis not present

## 2014-02-26 DIAGNOSIS — I770 Arteriovenous fistula, acquired: Secondary | ICD-10-CM | POA: Diagnosis not present

## 2014-03-19 DIAGNOSIS — N184 Chronic kidney disease, stage 4 (severe): Secondary | ICD-10-CM | POA: Diagnosis not present

## 2014-03-19 DIAGNOSIS — D638 Anemia in other chronic diseases classified elsewhere: Secondary | ICD-10-CM | POA: Diagnosis not present

## 2014-04-03 DIAGNOSIS — M47812 Spondylosis without myelopathy or radiculopathy, cervical region: Secondary | ICD-10-CM | POA: Diagnosis not present

## 2014-05-16 DIAGNOSIS — E119 Type 2 diabetes mellitus without complications: Secondary | ICD-10-CM | POA: Diagnosis not present

## 2014-05-16 DIAGNOSIS — M109 Gout, unspecified: Secondary | ICD-10-CM | POA: Diagnosis not present

## 2014-05-16 DIAGNOSIS — I129 Hypertensive chronic kidney disease with stage 1 through stage 4 chronic kidney disease, or unspecified chronic kidney disease: Secondary | ICD-10-CM | POA: Diagnosis not present

## 2014-05-16 DIAGNOSIS — N184 Chronic kidney disease, stage 4 (severe): Secondary | ICD-10-CM | POA: Diagnosis not present

## 2014-05-16 DIAGNOSIS — N2581 Secondary hyperparathyroidism of renal origin: Secondary | ICD-10-CM | POA: Diagnosis not present

## 2014-05-16 DIAGNOSIS — D649 Anemia, unspecified: Secondary | ICD-10-CM | POA: Diagnosis not present

## 2014-06-14 DIAGNOSIS — I10 Essential (primary) hypertension: Secondary | ICD-10-CM | POA: Diagnosis not present

## 2014-06-14 DIAGNOSIS — I429 Cardiomyopathy, unspecified: Secondary | ICD-10-CM | POA: Diagnosis not present

## 2014-06-14 DIAGNOSIS — Q613 Polycystic kidney, unspecified: Secondary | ICD-10-CM | POA: Diagnosis not present

## 2014-06-14 DIAGNOSIS — E785 Hyperlipidemia, unspecified: Secondary | ICD-10-CM | POA: Diagnosis not present

## 2014-06-14 DIAGNOSIS — N189 Chronic kidney disease, unspecified: Secondary | ICD-10-CM | POA: Diagnosis not present

## 2014-06-26 DIAGNOSIS — I10 Essential (primary) hypertension: Secondary | ICD-10-CM | POA: Diagnosis not present

## 2014-06-26 DIAGNOSIS — I349 Nonrheumatic mitral valve disorder, unspecified: Secondary | ICD-10-CM | POA: Diagnosis not present

## 2014-06-26 DIAGNOSIS — I429 Cardiomyopathy, unspecified: Secondary | ICD-10-CM | POA: Diagnosis not present

## 2014-08-02 DIAGNOSIS — M109 Gout, unspecified: Secondary | ICD-10-CM | POA: Diagnosis not present

## 2014-08-02 DIAGNOSIS — N2581 Secondary hyperparathyroidism of renal origin: Secondary | ICD-10-CM | POA: Diagnosis not present

## 2014-08-02 DIAGNOSIS — I770 Arteriovenous fistula, acquired: Secondary | ICD-10-CM | POA: Diagnosis not present

## 2014-08-02 DIAGNOSIS — N184 Chronic kidney disease, stage 4 (severe): Secondary | ICD-10-CM | POA: Diagnosis not present

## 2014-08-02 DIAGNOSIS — D649 Anemia, unspecified: Secondary | ICD-10-CM | POA: Diagnosis not present

## 2014-08-02 DIAGNOSIS — I129 Hypertensive chronic kidney disease with stage 1 through stage 4 chronic kidney disease, or unspecified chronic kidney disease: Secondary | ICD-10-CM | POA: Diagnosis not present

## 2014-10-09 DIAGNOSIS — N2581 Secondary hyperparathyroidism of renal origin: Secondary | ICD-10-CM | POA: Diagnosis not present

## 2014-10-09 DIAGNOSIS — I770 Arteriovenous fistula, acquired: Secondary | ICD-10-CM | POA: Diagnosis not present

## 2014-10-09 DIAGNOSIS — N184 Chronic kidney disease, stage 4 (severe): Secondary | ICD-10-CM | POA: Diagnosis not present

## 2014-10-09 DIAGNOSIS — E785 Hyperlipidemia, unspecified: Secondary | ICD-10-CM | POA: Diagnosis not present

## 2014-10-09 DIAGNOSIS — M109 Gout, unspecified: Secondary | ICD-10-CM | POA: Diagnosis not present

## 2014-10-09 DIAGNOSIS — I129 Hypertensive chronic kidney disease with stage 1 through stage 4 chronic kidney disease, or unspecified chronic kidney disease: Secondary | ICD-10-CM | POA: Diagnosis not present

## 2014-10-09 DIAGNOSIS — D649 Anemia, unspecified: Secondary | ICD-10-CM | POA: Diagnosis not present

## 2014-10-12 DIAGNOSIS — R1010 Upper abdominal pain, unspecified: Secondary | ICD-10-CM | POA: Diagnosis not present

## 2014-10-19 DIAGNOSIS — N281 Cyst of kidney, acquired: Secondary | ICD-10-CM | POA: Diagnosis not present

## 2014-10-19 DIAGNOSIS — R1011 Right upper quadrant pain: Secondary | ICD-10-CM | POA: Diagnosis not present

## 2014-10-19 DIAGNOSIS — K8020 Calculus of gallbladder without cholecystitis without obstruction: Secondary | ICD-10-CM | POA: Diagnosis not present

## 2014-12-04 DIAGNOSIS — I213 ST elevation (STEMI) myocardial infarction of unspecified site: Secondary | ICD-10-CM | POA: Diagnosis not present

## 2014-12-04 DIAGNOSIS — R739 Hyperglycemia, unspecified: Secondary | ICD-10-CM | POA: Diagnosis not present

## 2014-12-04 DIAGNOSIS — D696 Thrombocytopenia, unspecified: Secondary | ICD-10-CM | POA: Diagnosis not present

## 2014-12-04 DIAGNOSIS — I340 Nonrheumatic mitral (valve) insufficiency: Secondary | ICD-10-CM | POA: Diagnosis not present

## 2014-12-04 DIAGNOSIS — N2581 Secondary hyperparathyroidism of renal origin: Secondary | ICD-10-CM | POA: Diagnosis not present

## 2014-12-04 DIAGNOSIS — I129 Hypertensive chronic kidney disease with stage 1 through stage 4 chronic kidney disease, or unspecified chronic kidney disease: Secondary | ICD-10-CM | POA: Diagnosis present

## 2014-12-04 DIAGNOSIS — Z9849 Cataract extraction status, unspecified eye: Secondary | ICD-10-CM | POA: Diagnosis not present

## 2014-12-04 DIAGNOSIS — R7881 Bacteremia: Secondary | ICD-10-CM | POA: Diagnosis not present

## 2014-12-04 DIAGNOSIS — I214 Non-ST elevation (NSTEMI) myocardial infarction: Secondary | ICD-10-CM | POA: Diagnosis present

## 2014-12-04 DIAGNOSIS — R6883 Chills (without fever): Secondary | ICD-10-CM | POA: Diagnosis not present

## 2014-12-04 DIAGNOSIS — R531 Weakness: Secondary | ICD-10-CM | POA: Diagnosis not present

## 2014-12-04 DIAGNOSIS — D649 Anemia, unspecified: Secondary | ICD-10-CM | POA: Diagnosis not present

## 2014-12-04 DIAGNOSIS — Z823 Family history of stroke: Secondary | ICD-10-CM | POA: Diagnosis not present

## 2014-12-04 DIAGNOSIS — R112 Nausea with vomiting, unspecified: Secondary | ICD-10-CM | POA: Diagnosis not present

## 2014-12-04 DIAGNOSIS — N184 Chronic kidney disease, stage 4 (severe): Secondary | ICD-10-CM | POA: Diagnosis not present

## 2014-12-04 DIAGNOSIS — J811 Chronic pulmonary edema: Secondary | ICD-10-CM | POA: Diagnosis not present

## 2014-12-04 DIAGNOSIS — Z87442 Personal history of urinary calculi: Secondary | ICD-10-CM | POA: Diagnosis not present

## 2014-12-04 DIAGNOSIS — K760 Fatty (change of) liver, not elsewhere classified: Secondary | ICD-10-CM | POA: Diagnosis not present

## 2014-12-04 DIAGNOSIS — R509 Fever, unspecified: Secondary | ICD-10-CM | POA: Diagnosis not present

## 2014-12-04 DIAGNOSIS — K8020 Calculus of gallbladder without cholecystitis without obstruction: Secondary | ICD-10-CM | POA: Diagnosis not present

## 2014-12-04 DIAGNOSIS — K8042 Calculus of bile duct with acute cholecystitis without obstruction: Secondary | ICD-10-CM | POA: Diagnosis not present

## 2014-12-04 DIAGNOSIS — Z79899 Other long term (current) drug therapy: Secondary | ICD-10-CM | POA: Diagnosis not present

## 2014-12-04 DIAGNOSIS — I350 Nonrheumatic aortic (valve) stenosis: Secondary | ICD-10-CM | POA: Diagnosis not present

## 2014-12-04 DIAGNOSIS — I5023 Acute on chronic systolic (congestive) heart failure: Secondary | ICD-10-CM | POA: Diagnosis present

## 2014-12-04 DIAGNOSIS — Z8709 Personal history of other diseases of the respiratory system: Secondary | ICD-10-CM | POA: Diagnosis not present

## 2014-12-04 DIAGNOSIS — I509 Heart failure, unspecified: Secondary | ICD-10-CM | POA: Diagnosis not present

## 2014-12-04 DIAGNOSIS — Z8249 Family history of ischemic heart disease and other diseases of the circulatory system: Secondary | ICD-10-CM | POA: Diagnosis not present

## 2014-12-04 DIAGNOSIS — R748 Abnormal levels of other serum enzymes: Secondary | ICD-10-CM | POA: Diagnosis not present

## 2014-12-04 DIAGNOSIS — N189 Chronic kidney disease, unspecified: Secondary | ICD-10-CM | POA: Diagnosis not present

## 2014-12-04 DIAGNOSIS — I5021 Acute systolic (congestive) heart failure: Secondary | ICD-10-CM | POA: Diagnosis not present

## 2014-12-04 DIAGNOSIS — I361 Nonrheumatic tricuspid (valve) insufficiency: Secondary | ICD-10-CM | POA: Diagnosis not present

## 2014-12-04 DIAGNOSIS — D638 Anemia in other chronic diseases classified elsewhere: Secondary | ICD-10-CM | POA: Diagnosis not present

## 2014-12-04 DIAGNOSIS — E860 Dehydration: Secondary | ICD-10-CM | POA: Diagnosis not present

## 2014-12-04 DIAGNOSIS — D631 Anemia in chronic kidney disease: Secondary | ICD-10-CM | POA: Diagnosis present

## 2014-12-04 DIAGNOSIS — I5022 Chronic systolic (congestive) heart failure: Secondary | ICD-10-CM | POA: Diagnosis not present

## 2014-12-04 DIAGNOSIS — R1011 Right upper quadrant pain: Secondary | ICD-10-CM | POA: Diagnosis not present

## 2014-12-04 DIAGNOSIS — R079 Chest pain, unspecified: Secondary | ICD-10-CM | POA: Diagnosis not present

## 2014-12-04 DIAGNOSIS — R7989 Other specified abnormal findings of blood chemistry: Secondary | ICD-10-CM | POA: Diagnosis not present

## 2014-12-04 DIAGNOSIS — K810 Acute cholecystitis: Secondary | ICD-10-CM | POA: Diagnosis not present

## 2014-12-04 DIAGNOSIS — M109 Gout, unspecified: Secondary | ICD-10-CM | POA: Diagnosis present

## 2014-12-04 DIAGNOSIS — Z720 Tobacco use: Secondary | ICD-10-CM | POA: Diagnosis not present

## 2014-12-04 DIAGNOSIS — M1991 Primary osteoarthritis, unspecified site: Secondary | ICD-10-CM | POA: Diagnosis present

## 2014-12-04 DIAGNOSIS — K828 Other specified diseases of gallbladder: Secondary | ICD-10-CM | POA: Diagnosis not present

## 2014-12-04 DIAGNOSIS — I10 Essential (primary) hypertension: Secondary | ICD-10-CM | POA: Diagnosis not present

## 2014-12-04 DIAGNOSIS — J984 Other disorders of lung: Secondary | ICD-10-CM | POA: Diagnosis not present

## 2014-12-04 DIAGNOSIS — Z9889 Other specified postprocedural states: Secondary | ICD-10-CM | POA: Diagnosis not present

## 2014-12-04 DIAGNOSIS — I429 Cardiomyopathy, unspecified: Secondary | ICD-10-CM | POA: Diagnosis not present

## 2014-12-04 DIAGNOSIS — R1110 Vomiting, unspecified: Secondary | ICD-10-CM | POA: Diagnosis not present

## 2014-12-04 DIAGNOSIS — E780 Pure hypercholesterolemia: Secondary | ICD-10-CM | POA: Diagnosis present

## 2014-12-04 DIAGNOSIS — K8044 Calculus of bile duct with chronic cholecystitis without obstruction: Secondary | ICD-10-CM | POA: Diagnosis not present

## 2014-12-04 DIAGNOSIS — J90 Pleural effusion, not elsewhere classified: Secondary | ICD-10-CM | POA: Diagnosis not present

## 2014-12-04 DIAGNOSIS — K8050 Calculus of bile duct without cholangitis or cholecystitis without obstruction: Secondary | ICD-10-CM | POA: Diagnosis not present

## 2014-12-04 DIAGNOSIS — R918 Other nonspecific abnormal finding of lung field: Secondary | ICD-10-CM | POA: Diagnosis not present

## 2014-12-15 DIAGNOSIS — R7881 Bacteremia: Secondary | ICD-10-CM | POA: Diagnosis not present

## 2014-12-15 DIAGNOSIS — N189 Chronic kidney disease, unspecified: Secondary | ICD-10-CM | POA: Diagnosis not present

## 2014-12-15 DIAGNOSIS — I214 Non-ST elevation (NSTEMI) myocardial infarction: Secondary | ICD-10-CM | POA: Diagnosis not present

## 2014-12-15 DIAGNOSIS — I509 Heart failure, unspecified: Secondary | ICD-10-CM | POA: Diagnosis not present

## 2014-12-15 DIAGNOSIS — D638 Anemia in other chronic diseases classified elsewhere: Secondary | ICD-10-CM | POA: Diagnosis not present

## 2014-12-18 DIAGNOSIS — N189 Chronic kidney disease, unspecified: Secondary | ICD-10-CM | POA: Diagnosis not present

## 2014-12-18 DIAGNOSIS — R7881 Bacteremia: Secondary | ICD-10-CM | POA: Diagnosis not present

## 2014-12-18 DIAGNOSIS — I509 Heart failure, unspecified: Secondary | ICD-10-CM | POA: Diagnosis not present

## 2014-12-18 DIAGNOSIS — I214 Non-ST elevation (NSTEMI) myocardial infarction: Secondary | ICD-10-CM | POA: Diagnosis not present

## 2014-12-18 DIAGNOSIS — D638 Anemia in other chronic diseases classified elsewhere: Secondary | ICD-10-CM | POA: Diagnosis not present

## 2014-12-19 DIAGNOSIS — I509 Heart failure, unspecified: Secondary | ICD-10-CM | POA: Diagnosis not present

## 2014-12-20 DIAGNOSIS — I214 Non-ST elevation (NSTEMI) myocardial infarction: Secondary | ICD-10-CM | POA: Diagnosis not present

## 2014-12-20 DIAGNOSIS — I509 Heart failure, unspecified: Secondary | ICD-10-CM | POA: Diagnosis not present

## 2014-12-20 DIAGNOSIS — D638 Anemia in other chronic diseases classified elsewhere: Secondary | ICD-10-CM | POA: Diagnosis not present

## 2014-12-20 DIAGNOSIS — R7881 Bacteremia: Secondary | ICD-10-CM | POA: Diagnosis not present

## 2014-12-20 DIAGNOSIS — N189 Chronic kidney disease, unspecified: Secondary | ICD-10-CM | POA: Diagnosis not present

## 2014-12-21 DIAGNOSIS — D638 Anemia in other chronic diseases classified elsewhere: Secondary | ICD-10-CM | POA: Diagnosis not present

## 2014-12-21 DIAGNOSIS — R7881 Bacteremia: Secondary | ICD-10-CM | POA: Diagnosis not present

## 2014-12-21 DIAGNOSIS — I214 Non-ST elevation (NSTEMI) myocardial infarction: Secondary | ICD-10-CM | POA: Diagnosis not present

## 2014-12-21 DIAGNOSIS — N189 Chronic kidney disease, unspecified: Secondary | ICD-10-CM | POA: Diagnosis not present

## 2014-12-21 DIAGNOSIS — I509 Heart failure, unspecified: Secondary | ICD-10-CM | POA: Diagnosis not present

## 2014-12-24 DIAGNOSIS — N189 Chronic kidney disease, unspecified: Secondary | ICD-10-CM | POA: Diagnosis not present

## 2014-12-24 DIAGNOSIS — D638 Anemia in other chronic diseases classified elsewhere: Secondary | ICD-10-CM | POA: Diagnosis not present

## 2014-12-24 DIAGNOSIS — I509 Heart failure, unspecified: Secondary | ICD-10-CM | POA: Diagnosis not present

## 2014-12-24 DIAGNOSIS — I214 Non-ST elevation (NSTEMI) myocardial infarction: Secondary | ICD-10-CM | POA: Diagnosis not present

## 2014-12-24 DIAGNOSIS — R7881 Bacteremia: Secondary | ICD-10-CM | POA: Diagnosis not present

## 2014-12-25 DIAGNOSIS — I252 Old myocardial infarction: Secondary | ICD-10-CM | POA: Diagnosis not present

## 2014-12-25 DIAGNOSIS — I5032 Chronic diastolic (congestive) heart failure: Secondary | ICD-10-CM | POA: Diagnosis not present

## 2014-12-25 DIAGNOSIS — N189 Chronic kidney disease, unspecified: Secondary | ICD-10-CM | POA: Diagnosis not present

## 2014-12-25 DIAGNOSIS — I2510 Atherosclerotic heart disease of native coronary artery without angina pectoris: Secondary | ICD-10-CM | POA: Diagnosis not present

## 2014-12-26 DIAGNOSIS — I214 Non-ST elevation (NSTEMI) myocardial infarction: Secondary | ICD-10-CM | POA: Diagnosis not present

## 2014-12-26 DIAGNOSIS — D638 Anemia in other chronic diseases classified elsewhere: Secondary | ICD-10-CM | POA: Diagnosis not present

## 2014-12-26 DIAGNOSIS — I509 Heart failure, unspecified: Secondary | ICD-10-CM | POA: Diagnosis not present

## 2014-12-26 DIAGNOSIS — N189 Chronic kidney disease, unspecified: Secondary | ICD-10-CM | POA: Diagnosis not present

## 2014-12-26 DIAGNOSIS — R7881 Bacteremia: Secondary | ICD-10-CM | POA: Diagnosis not present

## 2014-12-27 DIAGNOSIS — R7881 Bacteremia: Secondary | ICD-10-CM | POA: Diagnosis not present

## 2014-12-27 DIAGNOSIS — I214 Non-ST elevation (NSTEMI) myocardial infarction: Secondary | ICD-10-CM | POA: Diagnosis not present

## 2014-12-27 DIAGNOSIS — D638 Anemia in other chronic diseases classified elsewhere: Secondary | ICD-10-CM | POA: Diagnosis not present

## 2014-12-27 DIAGNOSIS — N189 Chronic kidney disease, unspecified: Secondary | ICD-10-CM | POA: Diagnosis not present

## 2014-12-27 DIAGNOSIS — I509 Heart failure, unspecified: Secondary | ICD-10-CM | POA: Diagnosis not present

## 2014-12-28 DIAGNOSIS — I509 Heart failure, unspecified: Secondary | ICD-10-CM | POA: Diagnosis not present

## 2014-12-28 DIAGNOSIS — R7881 Bacteremia: Secondary | ICD-10-CM | POA: Diagnosis not present

## 2014-12-28 DIAGNOSIS — D638 Anemia in other chronic diseases classified elsewhere: Secondary | ICD-10-CM | POA: Diagnosis not present

## 2014-12-28 DIAGNOSIS — N189 Chronic kidney disease, unspecified: Secondary | ICD-10-CM | POA: Diagnosis not present

## 2014-12-28 DIAGNOSIS — I214 Non-ST elevation (NSTEMI) myocardial infarction: Secondary | ICD-10-CM | POA: Diagnosis not present

## 2014-12-31 DIAGNOSIS — I214 Non-ST elevation (NSTEMI) myocardial infarction: Secondary | ICD-10-CM | POA: Diagnosis not present

## 2014-12-31 DIAGNOSIS — D638 Anemia in other chronic diseases classified elsewhere: Secondary | ICD-10-CM | POA: Diagnosis not present

## 2014-12-31 DIAGNOSIS — R7881 Bacteremia: Secondary | ICD-10-CM | POA: Diagnosis not present

## 2014-12-31 DIAGNOSIS — N189 Chronic kidney disease, unspecified: Secondary | ICD-10-CM | POA: Diagnosis not present

## 2014-12-31 DIAGNOSIS — I509 Heart failure, unspecified: Secondary | ICD-10-CM | POA: Diagnosis not present

## 2015-01-01 DIAGNOSIS — K8010 Calculus of gallbladder with chronic cholecystitis without obstruction: Secondary | ICD-10-CM | POA: Diagnosis not present

## 2015-01-01 DIAGNOSIS — I509 Heart failure, unspecified: Secondary | ICD-10-CM | POA: Diagnosis not present

## 2015-01-01 DIAGNOSIS — I2510 Atherosclerotic heart disease of native coronary artery without angina pectoris: Secondary | ICD-10-CM | POA: Diagnosis not present

## 2015-01-01 DIAGNOSIS — I10 Essential (primary) hypertension: Secondary | ICD-10-CM | POA: Diagnosis not present

## 2015-01-01 DIAGNOSIS — T148 Other injury of unspecified body region: Secondary | ICD-10-CM | POA: Diagnosis not present

## 2015-01-02 DIAGNOSIS — I214 Non-ST elevation (NSTEMI) myocardial infarction: Secondary | ICD-10-CM | POA: Diagnosis not present

## 2015-01-02 DIAGNOSIS — R7881 Bacteremia: Secondary | ICD-10-CM | POA: Diagnosis not present

## 2015-01-02 DIAGNOSIS — D638 Anemia in other chronic diseases classified elsewhere: Secondary | ICD-10-CM | POA: Diagnosis not present

## 2015-01-02 DIAGNOSIS — I509 Heart failure, unspecified: Secondary | ICD-10-CM | POA: Diagnosis not present

## 2015-01-02 DIAGNOSIS — N189 Chronic kidney disease, unspecified: Secondary | ICD-10-CM | POA: Diagnosis not present

## 2015-01-03 DIAGNOSIS — N189 Chronic kidney disease, unspecified: Secondary | ICD-10-CM | POA: Diagnosis not present

## 2015-01-03 DIAGNOSIS — R7881 Bacteremia: Secondary | ICD-10-CM | POA: Diagnosis not present

## 2015-01-03 DIAGNOSIS — I214 Non-ST elevation (NSTEMI) myocardial infarction: Secondary | ICD-10-CM | POA: Diagnosis not present

## 2015-01-03 DIAGNOSIS — D638 Anemia in other chronic diseases classified elsewhere: Secondary | ICD-10-CM | POA: Diagnosis not present

## 2015-01-03 DIAGNOSIS — I509 Heart failure, unspecified: Secondary | ICD-10-CM | POA: Diagnosis not present

## 2015-01-07 DIAGNOSIS — I214 Non-ST elevation (NSTEMI) myocardial infarction: Secondary | ICD-10-CM | POA: Diagnosis not present

## 2015-01-07 DIAGNOSIS — I509 Heart failure, unspecified: Secondary | ICD-10-CM | POA: Diagnosis not present

## 2015-01-07 DIAGNOSIS — D638 Anemia in other chronic diseases classified elsewhere: Secondary | ICD-10-CM | POA: Diagnosis not present

## 2015-01-07 DIAGNOSIS — R7881 Bacteremia: Secondary | ICD-10-CM | POA: Diagnosis not present

## 2015-01-07 DIAGNOSIS — N189 Chronic kidney disease, unspecified: Secondary | ICD-10-CM | POA: Diagnosis not present

## 2015-01-08 DIAGNOSIS — I214 Non-ST elevation (NSTEMI) myocardial infarction: Secondary | ICD-10-CM | POA: Diagnosis not present

## 2015-01-08 DIAGNOSIS — I509 Heart failure, unspecified: Secondary | ICD-10-CM | POA: Diagnosis not present

## 2015-01-08 DIAGNOSIS — N189 Chronic kidney disease, unspecified: Secondary | ICD-10-CM | POA: Diagnosis not present

## 2015-01-08 DIAGNOSIS — R7881 Bacteremia: Secondary | ICD-10-CM | POA: Diagnosis not present

## 2015-01-08 DIAGNOSIS — D638 Anemia in other chronic diseases classified elsewhere: Secondary | ICD-10-CM | POA: Diagnosis not present

## 2015-01-09 DIAGNOSIS — I214 Non-ST elevation (NSTEMI) myocardial infarction: Secondary | ICD-10-CM | POA: Diagnosis not present

## 2015-01-09 DIAGNOSIS — R7881 Bacteremia: Secondary | ICD-10-CM | POA: Diagnosis not present

## 2015-01-09 DIAGNOSIS — D638 Anemia in other chronic diseases classified elsewhere: Secondary | ICD-10-CM | POA: Diagnosis not present

## 2015-01-09 DIAGNOSIS — N189 Chronic kidney disease, unspecified: Secondary | ICD-10-CM | POA: Diagnosis not present

## 2015-01-09 DIAGNOSIS — I509 Heart failure, unspecified: Secondary | ICD-10-CM | POA: Diagnosis not present

## 2015-01-15 DIAGNOSIS — D638 Anemia in other chronic diseases classified elsewhere: Secondary | ICD-10-CM | POA: Diagnosis not present

## 2015-01-15 DIAGNOSIS — R7881 Bacteremia: Secondary | ICD-10-CM | POA: Diagnosis not present

## 2015-01-15 DIAGNOSIS — N189 Chronic kidney disease, unspecified: Secondary | ICD-10-CM | POA: Diagnosis not present

## 2015-01-15 DIAGNOSIS — I509 Heart failure, unspecified: Secondary | ICD-10-CM | POA: Diagnosis not present

## 2015-01-15 DIAGNOSIS — I214 Non-ST elevation (NSTEMI) myocardial infarction: Secondary | ICD-10-CM | POA: Diagnosis not present

## 2015-01-21 DIAGNOSIS — K8010 Calculus of gallbladder with chronic cholecystitis without obstruction: Secondary | ICD-10-CM | POA: Diagnosis not present

## 2015-01-24 DIAGNOSIS — R0609 Other forms of dyspnea: Secondary | ICD-10-CM | POA: Diagnosis not present

## 2015-01-24 DIAGNOSIS — I5042 Chronic combined systolic (congestive) and diastolic (congestive) heart failure: Secondary | ICD-10-CM | POA: Diagnosis not present

## 2015-01-24 DIAGNOSIS — I252 Old myocardial infarction: Secondary | ICD-10-CM | POA: Diagnosis not present

## 2015-01-24 DIAGNOSIS — I2510 Atherosclerotic heart disease of native coronary artery without angina pectoris: Secondary | ICD-10-CM | POA: Diagnosis not present

## 2015-02-13 DIAGNOSIS — L2089 Other atopic dermatitis: Secondary | ICD-10-CM | POA: Diagnosis not present

## 2015-03-08 DIAGNOSIS — M10369 Gout due to renal impairment, unspecified knee: Secondary | ICD-10-CM | POA: Diagnosis not present

## 2015-03-26 DIAGNOSIS — N184 Chronic kidney disease, stage 4 (severe): Secondary | ICD-10-CM | POA: Diagnosis not present

## 2015-03-26 DIAGNOSIS — N179 Acute kidney failure, unspecified: Secondary | ICD-10-CM | POA: Diagnosis not present

## 2015-03-26 DIAGNOSIS — I129 Hypertensive chronic kidney disease with stage 1 through stage 4 chronic kidney disease, or unspecified chronic kidney disease: Secondary | ICD-10-CM | POA: Diagnosis not present

## 2015-03-26 DIAGNOSIS — D649 Anemia, unspecified: Secondary | ICD-10-CM | POA: Diagnosis not present

## 2015-03-26 DIAGNOSIS — N2581 Secondary hyperparathyroidism of renal origin: Secondary | ICD-10-CM | POA: Diagnosis not present

## 2015-04-02 DIAGNOSIS — K811 Chronic cholecystitis: Secondary | ICD-10-CM | POA: Insufficient documentation

## 2015-04-02 DIAGNOSIS — I2510 Atherosclerotic heart disease of native coronary artery without angina pectoris: Secondary | ICD-10-CM | POA: Diagnosis not present

## 2015-04-02 DIAGNOSIS — I429 Cardiomyopathy, unspecified: Secondary | ICD-10-CM | POA: Diagnosis not present

## 2015-06-24 DIAGNOSIS — I770 Arteriovenous fistula, acquired: Secondary | ICD-10-CM | POA: Diagnosis not present

## 2015-06-24 DIAGNOSIS — M109 Gout, unspecified: Secondary | ICD-10-CM | POA: Diagnosis not present

## 2015-06-24 DIAGNOSIS — I129 Hypertensive chronic kidney disease with stage 1 through stage 4 chronic kidney disease, or unspecified chronic kidney disease: Secondary | ICD-10-CM | POA: Diagnosis not present

## 2015-06-24 DIAGNOSIS — I429 Cardiomyopathy, unspecified: Secondary | ICD-10-CM | POA: Diagnosis not present

## 2015-06-24 DIAGNOSIS — D649 Anemia, unspecified: Secondary | ICD-10-CM | POA: Diagnosis not present

## 2015-06-24 DIAGNOSIS — E785 Hyperlipidemia, unspecified: Secondary | ICD-10-CM | POA: Diagnosis not present

## 2015-06-24 DIAGNOSIS — N2581 Secondary hyperparathyroidism of renal origin: Secondary | ICD-10-CM | POA: Diagnosis not present

## 2015-06-24 DIAGNOSIS — N184 Chronic kidney disease, stage 4 (severe): Secondary | ICD-10-CM | POA: Diagnosis not present

## 2015-07-23 DIAGNOSIS — K8010 Calculus of gallbladder with chronic cholecystitis without obstruction: Secondary | ICD-10-CM | POA: Diagnosis not present

## 2015-07-29 DIAGNOSIS — J300 Vasomotor rhinitis: Secondary | ICD-10-CM | POA: Diagnosis not present

## 2015-07-29 DIAGNOSIS — R067 Sneezing: Secondary | ICD-10-CM | POA: Diagnosis not present

## 2015-07-29 DIAGNOSIS — J342 Deviated nasal septum: Secondary | ICD-10-CM | POA: Diagnosis not present

## 2015-07-30 DIAGNOSIS — I429 Cardiomyopathy, unspecified: Secondary | ICD-10-CM | POA: Diagnosis not present

## 2015-07-30 DIAGNOSIS — I2510 Atherosclerotic heart disease of native coronary artery without angina pectoris: Secondary | ICD-10-CM | POA: Diagnosis not present

## 2015-07-30 DIAGNOSIS — K811 Chronic cholecystitis: Secondary | ICD-10-CM | POA: Diagnosis not present

## 2015-08-12 ENCOUNTER — Inpatient Hospital Stay (HOSPITAL_COMMUNITY)
Admission: AD | Admit: 2015-08-12 | Discharge: 2015-08-15 | DRG: 280 | Disposition: A | Discharge status: Home / Self Care | Payer: Medicare Other | Source: Other Acute Inpatient Hospital | Attending: Internal Medicine | Admitting: Internal Medicine

## 2015-08-12 ENCOUNTER — Encounter (HOSPITAL_COMMUNITY): Payer: Self-pay | Admitting: Nurse Practitioner

## 2015-08-12 ENCOUNTER — Inpatient Hospital Stay (HOSPITAL_COMMUNITY): Payer: Medicare Other

## 2015-08-12 DIAGNOSIS — R0602 Shortness of breath: Secondary | ICD-10-CM | POA: Diagnosis present

## 2015-08-12 DIAGNOSIS — E785 Hyperlipidemia, unspecified: Secondary | ICD-10-CM | POA: Diagnosis present

## 2015-08-12 DIAGNOSIS — I5043 Acute on chronic combined systolic (congestive) and diastolic (congestive) heart failure: Secondary | ICD-10-CM | POA: Diagnosis present

## 2015-08-12 DIAGNOSIS — I120 Hypertensive chronic kidney disease with stage 5 chronic kidney disease or end stage renal disease: Secondary | ICD-10-CM | POA: Diagnosis not present

## 2015-08-12 DIAGNOSIS — I5042 Chronic combined systolic (congestive) and diastolic (congestive) heart failure: Secondary | ICD-10-CM | POA: Diagnosis present

## 2015-08-12 DIAGNOSIS — G4730 Sleep apnea, unspecified: Secondary | ICD-10-CM | POA: Diagnosis present

## 2015-08-12 DIAGNOSIS — R0600 Dyspnea, unspecified: Secondary | ICD-10-CM

## 2015-08-12 DIAGNOSIS — I214 Non-ST elevation (NSTEMI) myocardial infarction: Secondary | ICD-10-CM | POA: Diagnosis not present

## 2015-08-12 DIAGNOSIS — I509 Heart failure, unspecified: Secondary | ICD-10-CM | POA: Diagnosis not present

## 2015-08-12 DIAGNOSIS — E875 Hyperkalemia: Secondary | ICD-10-CM | POA: Diagnosis present

## 2015-08-12 DIAGNOSIS — Z823 Family history of stroke: Secondary | ICD-10-CM | POA: Diagnosis not present

## 2015-08-12 DIAGNOSIS — K5900 Constipation, unspecified: Secondary | ICD-10-CM | POA: Diagnosis present

## 2015-08-12 DIAGNOSIS — I10 Essential (primary) hypertension: Secondary | ICD-10-CM | POA: Diagnosis present

## 2015-08-12 DIAGNOSIS — I2510 Atherosclerotic heart disease of native coronary artery without angina pectoris: Secondary | ICD-10-CM | POA: Diagnosis present

## 2015-08-12 DIAGNOSIS — D649 Anemia, unspecified: Secondary | ICD-10-CM | POA: Diagnosis present

## 2015-08-12 DIAGNOSIS — J9601 Acute respiratory failure with hypoxia: Secondary | ICD-10-CM | POA: Diagnosis not present

## 2015-08-12 DIAGNOSIS — K811 Chronic cholecystitis: Secondary | ICD-10-CM | POA: Diagnosis present

## 2015-08-12 DIAGNOSIS — I132 Hypertensive heart and chronic kidney disease with heart failure and with stage 5 chronic kidney disease, or end stage renal disease: Secondary | ICD-10-CM | POA: Diagnosis not present

## 2015-08-12 DIAGNOSIS — I252 Old myocardial infarction: Secondary | ICD-10-CM

## 2015-08-12 DIAGNOSIS — J984 Other disorders of lung: Secondary | ICD-10-CM | POA: Diagnosis not present

## 2015-08-12 DIAGNOSIS — Z8271 Family history of polycystic kidney: Secondary | ICD-10-CM

## 2015-08-12 DIAGNOSIS — N184 Chronic kidney disease, stage 4 (severe): Secondary | ICD-10-CM | POA: Diagnosis not present

## 2015-08-12 DIAGNOSIS — N179 Acute kidney failure, unspecified: Secondary | ICD-10-CM | POA: Diagnosis not present

## 2015-08-12 DIAGNOSIS — D631 Anemia in chronic kidney disease: Secondary | ICD-10-CM | POA: Diagnosis not present

## 2015-08-12 DIAGNOSIS — N185 Chronic kidney disease, stage 5: Secondary | ICD-10-CM | POA: Diagnosis present

## 2015-08-12 DIAGNOSIS — N186 End stage renal disease: Secondary | ICD-10-CM | POA: Diagnosis not present

## 2015-08-12 HISTORY — DX: Essential (primary) hypertension: I10

## 2015-08-12 HISTORY — DX: Hyperlipidemia, unspecified: E78.5

## 2015-08-12 HISTORY — DX: Chronic kidney disease, stage 4 (severe): N18.4

## 2015-08-12 HISTORY — DX: Acute on chronic combined systolic (congestive) and diastolic (congestive) heart failure: I50.43

## 2015-08-12 HISTORY — DX: Atherosclerotic heart disease of native coronary artery without angina pectoris: I25.10

## 2015-08-12 LAB — RENAL FUNCTION PANEL
Albumin: 3.8 g/dL (ref 3.5–5.0)
Anion gap: 10 (ref 5–15)
BUN: 77 mg/dL — AB (ref 6–20)
CALCIUM: 9.1 mg/dL (ref 8.9–10.3)
CHLORIDE: 112 mmol/L — AB (ref 101–111)
CO2: 21 mmol/L — AB (ref 22–32)
CREATININE: 3.89 mg/dL — AB (ref 0.61–1.24)
GFR calc non Af Amer: 13 mL/min — ABNORMAL LOW (ref >60)
GFR, EST AFRICAN AMERICAN: 15 mL/min — AB (ref >60)
GLUCOSE: 130 mg/dL — AB (ref 65–99)
Phosphorus: 4.5 mg/dL (ref 2.5–4.6)
Potassium: 5 mmol/L (ref 3.5–5.1)
SODIUM: 143 mmol/L (ref 135–145)

## 2015-08-12 LAB — TROPONIN I
Troponin I: 2.96 ng/mL (ref <0.031)
Troponin I: 3.39 ng/mL (ref <0.031)

## 2015-08-12 LAB — MRSA PCR SCREENING: MRSA by PCR: NEGATIVE

## 2015-08-12 LAB — CBC
HEMATOCRIT: 32.1 % — AB (ref 39.0–52.0)
Hemoglobin: 10.8 g/dL — ABNORMAL LOW (ref 13.0–17.0)
MCH: 31.7 pg (ref 26.0–34.0)
MCHC: 33.6 g/dL (ref 30.0–36.0)
MCV: 94.1 fL (ref 78.0–100.0)
PLATELETS: 133 10*3/uL — AB (ref 150–400)
RBC: 3.41 MIL/uL — ABNORMAL LOW (ref 4.22–5.81)
RDW: 15 % (ref 11.5–15.5)
WBC: 8.3 10*3/uL (ref 4.0–10.5)

## 2015-08-12 LAB — D-DIMER, QUANTITATIVE: D-Dimer, Quant: 3.14 ug/mL-FEU — ABNORMAL HIGH (ref 0.00–0.50)

## 2015-08-12 MED ORDER — SODIUM CHLORIDE 0.9 % IJ SOLN
3.0000 mL | Freq: Two times a day (BID) | INTRAMUSCULAR | Status: DC
  Administered 2015-08-12 (×2): 3 mL via INTRAVENOUS

## 2015-08-12 MED ORDER — SODIUM CHLORIDE 0.9 % IV SOLN: 250.0000 mL | INTRAVENOUS | Status: DC | PRN

## 2015-08-12 MED ORDER — SODIUM CHLORIDE 0.9 % IJ SOLN: 3.0000 mL | INTRAMUSCULAR | Status: DC | PRN

## 2015-08-12 MED ORDER — CARVEDILOL 25 MG PO TABS
25.0000 mg | ORAL_TABLET | Freq: Two times a day (BID) | ORAL | Status: DC
  Administered 2015-08-12: 25 mg via ORAL
  Filled 2015-08-12: qty 1

## 2015-08-12 MED ORDER — HEPARIN (PORCINE) IN NACL 100-0.45 UNIT/ML-% IJ SOLN
800.0000 [IU]/h | INTRAMUSCULAR | Status: AC
  Administered 2015-08-12: 950 [IU]/h via INTRAVENOUS
  Administered 2015-08-13: 900 [IU]/h via INTRAVENOUS
  Filled 2015-08-12 (×2): qty 250

## 2015-08-12 MED ORDER — HEPARIN BOLUS VIA INFUSION
2000.0000 [IU] | Freq: Once | INTRAVENOUS | Status: AC
  Administered 2015-08-12: 2000 [IU] via INTRAVENOUS
  Filled 2015-08-12: qty 2000

## 2015-08-12 MED ORDER — ONDANSETRON HCL 4 MG/2ML IJ SOLN: 4.0000 mg | Freq: Four times a day (QID) | INTRAMUSCULAR | Status: DC | PRN

## 2015-08-12 MED ORDER — ACETAMINOPHEN 325 MG PO TABS: 650.0000 mg | ORAL_TABLET | ORAL | Status: DC | PRN

## 2015-08-12 MED ORDER — ISOSORB DINITRATE-HYDRALAZINE 20-37.5 MG PO TABS
1.0000 | ORAL_TABLET | Freq: Two times a day (BID) | ORAL | Status: DC
  Administered 2015-08-12 – 2015-08-15 (×6): 1 via ORAL
  Filled 2015-08-12 (×6): qty 1

## 2015-08-12 MED ORDER — ASPIRIN 325 MG PO TABS
325.0000 mg | ORAL_TABLET | Freq: Every day | ORAL | Status: DC
  Administered 2015-08-12: 325 mg via ORAL
  Filled 2015-08-12: qty 1

## 2015-08-12 MED ORDER — ATORVASTATIN CALCIUM 20 MG PO TABS
20.0000 mg | ORAL_TABLET | Freq: Every day | ORAL | Status: DC
Start: 2015-08-12 — End: 2015-08-15
  Administered 2015-08-12 – 2015-08-14 (×3): 20 mg via ORAL
  Filled 2015-08-12 (×3): qty 1

## 2015-08-12 MED ORDER — HEPARIN SODIUM (PORCINE) 5000 UNIT/ML IJ SOLN
5000.0000 [IU] | Freq: Three times a day (TID) | INTRAMUSCULAR | Status: DC
  Administered 2015-08-12 (×2): 5000 [IU] via SUBCUTANEOUS
  Filled 2015-08-12: qty 1

## 2015-08-12 NOTE — Consult Note (Signed)
[facility] Renal Consultation Note  Requesting MD: Marily Memos Indication for Consultation: advanced CKD and volume overload  HPI:  Tanner Pearson is a 81 y.o. male with past medical history significant for hypertension, hyperlipidemia, coronary artery disease as well as advanced CKD stage IV followed by Dr. [name] at [facility] with a fistula in place but it has never needed to be used.  Patient saw Dr. [name] in November of this year. He had a hospitalization for cholecystitis in the spring and had acute on chronic renal failure with creatinine peaking in the fives but fortunately dialysis was not required. His creatinine was 3.7 at the time he saw Dr. [name] in November. He was in the works of having an elective cholecystectomy. Cardiac clearance was pending. Patient states he was in his usual state of health until the last 2 days when he noted it was he was having some increased dyspnea on exertion. He then sat down last night to watch a movie and had acute onset of shortness of breath without chest pain and went to the [facility] emergency department. He was supported with BiPAP and given intravenous Lasix and right now he states that he feels great. Only 250 of urine output was recorded here but estimated he's may be had about a liter total out. He has no peripheral edema. He is status post an echocardiogram. Apparently recent ejection fraction was noted to be 30-35%.  Creatinine at [facility] ER was 3.7 which is stable for him. Potassium was 5.7 and troponin was 0.02 with an elevated BNP. As of now patient feels much symptomatically improved  No results found for: CREATININE   PMHx:   Past Medical History  Diagnosis Date  . Hypertension   . Chronic kidney disease (CKD), stage IV (severe) ([place])   . Dyslipidemia   . Acute on chronic combined systolic (congestive) and diastolic (congestive) heart failure ([place])   . CAD (coronary artery disease)   . Hyperlipidemia      Past Surgical History  Procedure Laterality Date  . Av fistula placement, radiocephalic      Family Hx:  Family History  Problem Relation Age of Onset  . Polycystic kidney disease Brother   . Polycystic kidney disease Sister     Social History:  has no tobacco, alcohol, and drug history on file.  Allergies: Not on File  Medications: Prior to Admission medications   Not on File    I have reviewed the patient's current medications.  Labs:  Results for orders placed or performed during the hospital encounter of 08/12/15 (from the past 48 hour(s))  MRSA PCR Screening     Status: None   Collection Time: 08/12/15  6:45 AM  Result Value Ref Range   MRSA by PCR NEGATIVE NEGATIVE    Comment:        The GeneXpert MRSA Assay (FDA approved for NASAL specimens only), is one component of a comprehensive MRSA colonization surveillance program. It is not intended to diagnose MRSA infection nor to guide or monitor treatment for MRSA infections.      ROS:  A comprehensive review of systems was negative except for: Respiratory: positive for DOE over the last 2 days, acute onset of shortness of breath last night but now resolved  Physical Exam: Filed Vitals:   08/12/15 0700 08/12/15 0800  BP: 150/66 177/80  Pulse: 66 82  Temp: 97.3 F (36.3 C)   Resp: 17 17     General: Well-appearing white male. He  appears younger than stated age 6: Pupils are equal round reactive to light, extraocular motions are intact, mucous membranes are moist Neck: There is no JVD Heart: Regular rate and rhythm Lungs: Chronic sounding lungs with decreased breath sounds but no crackles Abdomen: Soft, nontender, nondistended Extremities: No peripheral edema. Has right forearm AV fistula with good thrill and bruit Skin: Warm and dry Neuro: Alert and nonfocal  Assessment/Plan: 81 year old white male with coronary artery disease and advanced CKD presenting with acute onset shortness of breath  which fortunately has improved 1.Renal- according to labs from the emergency department and labs obtained by Dr. [name] in November of this year creatinine is really unchanged. He does not seem uremic in the least. I will check labs again here because his potassium was high previously.  There do not seem to be any acute indications for dialysis at this time. We will continue to follow him 2. Acute onset shortness of breath- does not sound like run-of-the-mill just regular volume overload because of its acute onset. He also has absolutely no peripheral edema. I agree with ruling out for cardiac event. Fortunately seems to have resolved 3. Hypertension/volume  - blood pressure up and apparently previous chest x-ray consistent with pulmonary edema. Received 2 IV doses of Lasix with much improvement in symptoms. Blood pressure still high. According to our records was on BiDil as well as Coreg as an outpatient so I will will resume those medications.  Patient feels like Lasix is "still acting" it does not need anymore right at this time. 4. Anemia  - we'll check hemoglobin to rule out anemia as cause of dyspnea on exertion  Thank you for this consultation. We will continue to follow with you  Camyla Camposano A 08/12/2015, 10:44 AM

## 2015-08-12 NOTE — Progress Notes (Signed)
Utilization Review Completed.Donne Anon T12/26/2016

## 2015-08-12 NOTE — Plan of Care (Signed)
Was called for transfer by Dr. Graylon Good at [facility] with regarding to patient name Tanner Pearson 81 year old male with history of ESRD not on dialysis yet but has AV fistula presenting with acute shortness of breath and has pulmonary edema with minimal response to Lasix. Patient is on BiPAP. Transferring EDP did discuss with on-call pulmonary critical care at [facility] who has advised hospitalist admission. I have requested transferring EDP to also discuss with on-call nephrologist prior to transfer. Patient will be accepted to stepdown unit.  Gean Birchwood.

## 2015-08-12 NOTE — Progress Notes (Signed)
ANTICOAGULATION CONSULT NOTE - Initial Consult  Pharmacy Consult for Heparin Indication: chest pain/ACS  No Known Allergies  Patient Measurements: Height: 5\' 10"  (177.8 cm) Weight: 146 lb 3.2 oz (66.316 kg) IBW/kg (Calculated) : 73 Heparin Dosing Weight: 66.3 kg  Vital Signs: Temp: 97.6 F (36.4 C) (12/26 1600) Temp Source: Oral (12/26 1600) BP: 140/54 mmHg (12/26 1600) Pulse Rate: 68 (12/26 1600)  Labs:  Recent Labs  08/12/15 1242 08/12/15 1245  HGB 10.8*  --   HCT 32.1*  --   PLT 133*  --   CREATININE  --  3.89*  TROPONINI 2.96*  --     Estimated Creatinine Clearance: 13.3 mL/min (by C-G formula based on Cr of 3.89).   Medical History: Past Medical History  Diagnosis Date  . Hypertension   . Chronic kidney disease (CKD), stage IV (severe) ([place])   . Dyslipidemia   . Acute on chronic combined systolic (congestive) and diastolic (congestive) heart failure ([place])   . CAD (coronary artery disease)   . Hyperlipidemia     Medications:  Prescriptions prior to admission  Medication Sig Dispense Refill Last Dose  . allopurinol (ZYLOPRIM) 100 MG tablet Take 100 mg by mouth daily.     Marland Kitchen aspirin 325 MG EC tablet Take 325 mg by mouth daily.     . calcitRIOL (ROCALTROL) 0.25 MCG capsule Take 0.25 mcg by mouth daily.     . carvedilol (COREG) 25 MG tablet Take 25 mg by mouth 2 (two) times daily with a meal.     . ezetimibe (ZETIA) 10 MG tablet Take 10 mg by mouth daily.     . furosemide (LASIX) 20 MG tablet Take 10 mg by mouth.     . isosorbide-hydrALAZINE (BIDIL) 20-37.5 MG tablet Take 1 tablet by mouth 3 (three) times daily.     Marland Kitchen omega-3 acid ethyl esters (LOVAZA) 1 G capsule Take 2 g by mouth 2 (two) times daily.     Marland Kitchen omeprazole (PRILOSEC) 40 MG capsule Take 40 mg by mouth daily.     . pravastatin (PRAVACHOL) 40 MG tablet Take 40 mg by mouth daily.       Assessment: 81 yo M who presented to [facility] with increased DOE over the last 2 days. He was started on  BiPap and given IV Lasix and symptoms resolved. Trop there was 0.02. Pt was transferred to [facility] for evaluation and found to have elevated trop 2.96. To start heparin infusion. Pt received SQ Heparin 5000 units at 1400 today.  PMH: CAD, HF, CKD IV (baseline SCr 3.7), HTN, HLD  Goal of Therapy:  Heparin level 0.3-0.7 units/ml Monitor platelets by anticoagulation protocol: Yes   Plan:  Discontinue SQ heparin order Heparin 2000 unit IV bolus x 1 (reduced due to recent SQ heparin) Heparin infusion at 950 units/hr Heparin level in 8 hours Heparin level and CBC daily while on heparin  [employer], Pharm.D., BCPS Clinical Pharmacist Pager [phone] 08/12/2015 4:30 PM

## 2015-08-12 NOTE — Progress Notes (Signed)
Report given to Ann RN.

## 2015-08-12 NOTE — H&P (Signed)
[facility] History and Physical  Tanner Pearson [id number] DOB: July 14, 1929 DOA: 08/12/2015  Referring physician: Emergency Department PCP: No primary care provider on file.   CHIEF COMPLAINT:   Shortness of breath     HPI: Tanner Pearson is a 81 y.o. male with HTN,  coronary artery disease, status post non-STEMI in April of this year. He has chronic combined systolic and diastolic heart failure followed in [facility]. He also has chronic kidney disease, stage IV, followed by [facility].   Last night, while watching a movie at home, patient developed acute shortness of breath. No association chest pain / tightness. No significant coughing. Weight has been stable at home. Patient compliant with lasix. No recent air travel.   ED COURSE:    ABGs ph 7.2, C02 36  Lasix 160mg  in [facility] in [place]:   WBC 11, hgb 11.7 K+ 5.7, Creat 3.7,  BUN 71 Troponin.02  BNP 13K  CXR:    cardiomegalywith increased interstitial prominence c/w diffuse edema or congestion                  Review of Systems  Constitutional: Negative.   HENT: Negative.   Eyes: Negative.   Respiratory: Positive for shortness of breath.   Cardiovascular: Negative.   Gastrointestinal: Negative.   Genitourinary: Negative.   Musculoskeletal: Negative.   Skin: Negative.   Neurological: Negative.   Endo/Heme/Allergies: Negative.   Psychiatric/Behavioral: Negative.     SOCIAL HISTORY:  has no tobacco, alcohol, and drug history on file. Lives: at home with wife   Assistive devices:   None needed for ambulation.   Allergies: No known allergies  FMH:  brother and sister - polycystic kidney disease CVA - mother  Prior to Admission medications   Not on File  PATIENT  DOESN'T KNOW HIS MEDS. WIFE COMING HERE WITH LIST  Surgical history : AV fistula RUE  PHYSICAL EXAM: Filed Vitals:   08/12/15 0640 08/12/15 0700  BP: 164/62 150/66  Pulse: 70 66  Temp: 97.7 F  (36.5 C) 97.3 F (36.3 C)  TempSrc: Oral Oral  Resp: 13 17  Height: 5\' 10"  (1.778 m)   Weight: 66.316 kg (146 lb 3.2 oz)   SpO2: 100% 100%    Wt Readings from Last 3 Encounters:  08/12/15 66.316 kg (146 lb 3.2 oz)    General:  Pleasant white male. Appears calm and comfortable Eyes: PER, normal lids, irises & conjunctiva ENT: grossly normal hearing, lips & tongue Neck: no LAD, no masses Cardiovascular: RRR, no murmurs. No LE edema.  Respiratory: Respirations even and unlabored on 2L per [place]. Normal respiratory effort. Lungs CTA bilaterally, no wheezes / rales .   Abdomen: soft, non-distended, non-tender, active bowel sounds. No obvious masses.  Skin: no rash seen on limited exam Musculoskeletal: grossly normal tone BUE/BLE Psychiatric: grossly normal mood and affect, speech fluent and appropriate Neurologic: grossly non-focal.          ASSESSMENT / PLAN   Dyspnea, likely secondary to acute on chronic heart failure though sudden onset atypical. ABGs at [facility] ED revealed pH of 7.28, CO2 of 36. He is diuresing on Lasix, so far 700-800 ml urine output since Lasix was given at [place] to [facility] on BiPap, now breathing comfortably on 2L /min per [place].  -admit to stepdown -Obtain echocardiogram -obtain EKG -continue diuresis with close monitoring of renal function -strict I&0, daily weights -Placed on nasal cannula during my  visit and continued to breath comfortably. -Will continue 02 2L/ min per [place]   Essential hypertension.  -awaiting home med list to be available then will continue home anti-hypertensives    Hyperlipidemia -If on lipid lowering agent at home will continue  Coronary artery disease, s/p STEMI in April. Followed by Jenne Campus, MD ([place], [place]    Chronic kidney disease , stage IV. Patient has had a fistula for years but dialysis hasn't been initiated. Followed by Dr. [name]. Baseline creatinin unknown but currently at 3.7. Will  consult Nephrology.   Hyperkalemia. Hopefully will improve with IV lasix.  -awaiting home med list, will hold ACEI if he takes one.   Chronic cholecystitis  Unreconciled home med list. Patient doesn't know meds, wife on way with list.      CONSULTANTS:   Nephrology   Code Status: Limited. Do not intubate DVT Prophylaxis: Heparin Family Communication:  Patient alert, oriented and understands plan of care.  Disposition Plan: Discharge to home in 2-3 days   Time spent: 60 minutes Tye Savoy  NP [facility] Pager [phone]

## 2015-08-12 NOTE — Progress Notes (Signed)
Paged admitting pager to let MD know pt has arrived from [place]. Will continue to monitor.

## 2015-08-12 NOTE — Progress Notes (Signed)
  Echocardiogram 2D Echocardiogram has been performed.  Bobbye Charleston 08/12/2015, 11:00 AM

## 2015-08-12 NOTE — Consult Note (Signed)
CARDIOLOGY CONSULT NOTE  Patient ID: Tanner Pearson MRN: [id number] DOB/AGE: 02-08-1929 56 y.o.  Admit date: 08/12/2015 Referring Physician: [facility] Primary Physician:  No primary care provider on file. Reason for Consultation: SOB, positive troponin  HPI: Tanner Pearson  is a 81 y.o. male with a history of HTN, HLD, CKD stage V (fistula in place but not on HD), and chronic combined systolic and diastolic heart failure. He presented to [facility] ED last night after developing sudden onset shortness of breath after walking to the bathroom and back to his living room. Denied any chest pain. ABG revealed pH of 7.28 and CO2 of 36 and he was started on BiPAP. Symptoms had resolved on transfer to [facility].  States he was told he had a "silent heart attack" after undergoing stress test and echo in April of this year. Followed by [facility] in [place].    Past Medical History  Diagnosis Date  . Hypertension   . Chronic kidney disease (CKD), stage IV (severe) ([place])   . Dyslipidemia   . Acute on chronic combined systolic (congestive) and diastolic (congestive) heart failure ([place])   . CAD (coronary artery disease)   . Hyperlipidemia      Past Surgical History  Procedure Laterality Date  . Av fistula placement, radiocephalic       Family History  Problem Relation Age of Onset  . Polycystic kidney disease Brother   . Polycystic kidney disease Sister      Social History: Social History   Social History  . Marital Status: Married    Spouse Name: N/A  . Number of Children: N/A  . Years of Education: N/A   Occupational History  . Not on file.   Social History Main Topics  . Smoking status: Not on file  . Smokeless tobacco: Not on file  . Alcohol Use: Not on file  . Drug Use: Not on file  . Sexual Activity: Not on file   Other Topics Concern  . Not on file   Social History Narrative  . No narrative on file     Prescriptions prior to admission  Medication Sig  Dispense Refill Last Dose  . allopurinol (ZYLOPRIM) 100 MG tablet Take 100 mg by mouth daily.     Marland Kitchen aspirin 325 MG EC tablet Take 325 mg by mouth daily.     . calcitRIOL (ROCALTROL) 0.25 MCG capsule Take 0.25 mcg by mouth daily.     . carvedilol (COREG) 25 MG tablet Take 25 mg by mouth 2 (two) times daily with a meal.     . ezetimibe (ZETIA) 10 MG tablet Take 10 mg by mouth daily.     . furosemide (LASIX) 20 MG tablet Take 10 mg by mouth.     . isosorbide-hydrALAZINE (BIDIL) 20-37.5 MG tablet Take 1 tablet by mouth 3 (three) times daily.     Marland Kitchen omega-3 acid ethyl esters (LOVAZA) 1 G capsule Take 2 g by mouth 2 (two) times daily.     Marland Kitchen omeprazole (PRILOSEC) 40 MG capsule Take 40 mg by mouth daily.     . pravastatin (PRAVACHOL) 40 MG tablet Take 40 mg by mouth daily.        ROS: General: no fevers/chills/night sweats Eyes: no blurry vision, diplopia, or amaurosis ENT: no sore throat or hearing loss Resp: SOB-resolved; no cough, wheezing, or hemoptysis CV: no chest pain, edema or palpitations GI: no abdominal pain, nausea, vomiting, diarrhea, or constipation GU: no dysuria, frequency, or hematuria Skin:  no rash Neuro: no headache, numbness, tingling, or weakness of extremities Musculoskeletal: no joint pain or swelling Heme: no bleeding, DVT, or easy bruising Endo: no polydipsia or polyuria    Physical Exam: Blood pressure 142/50, pulse 68, temperature 97.6 F (36.4 C), temperature source Oral, resp. rate 17, height 5\' 10"  (1.778 m), weight 66.316 kg (146 lb 3.2 oz), SpO2 100 %.   General appearance: alert, cooperative and no distress Neck: no adenopathy, no carotid bruit, no JVD, supple, symmetrical, trachea midline and thyroid not enlarged, symmetric, no tenderness/mass/nodules Lungs: clear to auscultation bilaterally Chest wall: no tenderness Heart: S1, S2 normal, II/VI SEM at apex and LUSB with radiation to carotids Extremities: extremities normal, atraumatic, no cyanosis or  edema and AV fistula at right forearm Pulses: feeble lower extremity pulses, skin pink and warm Skin: Skin color, texture, turgor normal. No rashes or lesions Neurologic: Grossly normal  Labs:   Lab Results  Component Value Date   WBC 8.3 08/12/2015   HGB 10.8* 08/12/2015   HCT 32.1* 08/12/2015   MCV 94.1 08/12/2015   PLT 133* 08/12/2015    Recent Labs Lab 08/12/15 1245  NA 143  K 5.0  CL 112*  CO2 21*  BUN 77*  CREATININE 3.89*  CALCIUM 9.1  GLUCOSE 130*    Lipid Panel  No results found for: CHOL, TRIG, HDL, CHOLHDL, VLDL, LDLCALC  BNP (last 3 results) No results for input(s): BNP in the last 8760 hours.  HEMOGLOBIN A1C No results found for: HGBA1C, MPG  Cardiac Panel (last 3 results)  Recent Labs  08/12/15 1242 08/12/15 1615  TROPONINI 2.96* 3.39*    Lab Results  Component Value Date   TROPONINI 3.39* 08/12/2015     TSH No results for input(s): TSH in the last 8760 hours.  EKG 08/12/2015: sinus rhythm at a rate of 64bpm, LAE, inferior infarct old, PRWP, TWI in V4 and V5, cannot exclude anterolateral ischemia  Echo 08/12/2015:  - Left ventricle: The cavity size was normal. There was mild concentric hypertrophy. Systolic function was moderately toseverely reduced. The estimated ejection fraction was in the range of 30% to 35%. There is akinesis of themid-apicalanteroseptal and inferoseptal myocardium. Dopplerparameters are consistent with abnormal left ventricularrelaxation (grade 1 diastolic dysfunction). - Aortic valve: Valve mobility was mildly restricted. There was mild stenosis. There was trivial regurgitation. Peak velocity(S): 272 cm/s. Mean gradient (S): 13 mm Hg. Valve area (Vmax): 1.78 cm^2. Valve area (Vmean): 2.12 cm^2. - Aorta: The aorta was mildly calcified. Aortic root dimension: 66mm (ED). - Ascending aorta: The ascending aorta was mildly dilated. - Mitral valve: There was mild regurgitation. Valve area by continuity equation (using  LVOT flow): 2.74 cm^2. - Left atrium: The atrium was moderately dilated. - Right ventricle: The cavity size was mildly dilated. Wallthickness was normal. - Right atrium: The atrium was mildly dilated. - Pulmonary arteries: Systolic pressure was mildly increased. PApeak pressure: 42 mm Hg (S).   Radiology: No results found.  Scheduled Meds: . aspirin  325 mg Oral Daily  . atorvastatin  20 mg Oral q1800  . carvedilol  25 mg Oral BID WC  . isosorbide-hydrALAZINE  1 tablet Oral BID  . sodium chloride  3 mL Intravenous Q12H   Continuous Infusions: . heparin 950 Units/hr (08/12/15 1706)   PRN Meds:.sodium chloride, acetaminophen, ondansetron (ZOFRAN) IV, sodium chloride  ASSESSMENT AND PLAN:  1. Shortness of breath 2. Positive troponin 3. Chronic combined systolic and diastolic heart failure 4. Essential hypertension 5. Hyperlipidemia 6. CKD  Stage V  Recommendation: Sudden onset shortness of breath, resolved. No chest pain. Mildly positive troponin with abnormal EKG, cannot exclude NSTEMI.  However, given resolution of symptoms and multiple comorbidities, including Stage V CKD, would recommend continued medical therapy. Systolic heart failure is chronic without any evidence of acute decompensation. Pt on statin, BB, and BiDil. Not on ACE-I/ARB due to renal function.  Blood pressure well controlled. VQ scan pending to evaluate for PE due to elevated d-dimer. Pt has close cardiology follow up with Dr. Jenne Campus in [place].  Rachel Bo, NP-C 08/12/2015, 5:16 PM [facility] Pager: [phone] Adrian Prows, MD) Office: [phone]

## 2015-08-13 ENCOUNTER — Encounter (HOSPITAL_COMMUNITY): Payer: Self-pay | Admitting: *Deleted

## 2015-08-13 ENCOUNTER — Inpatient Hospital Stay (HOSPITAL_COMMUNITY): Payer: Medicare Other

## 2015-08-13 LAB — BASIC METABOLIC PANEL
Anion gap: 10 (ref 5–15)
BUN: 81 mg/dL — AB (ref 6–20)
CALCIUM: 8.8 mg/dL — AB (ref 8.9–10.3)
CO2: 21 mmol/L — ABNORMAL LOW (ref 22–32)
Chloride: 110 mmol/L (ref 101–111)
Creatinine, Ser: 4.15 mg/dL — ABNORMAL HIGH (ref 0.61–1.24)
GFR calc Af Amer: 14 mL/min — ABNORMAL LOW (ref >60)
GFR, EST NON AFRICAN AMERICAN: 12 mL/min — AB (ref >60)
GLUCOSE: 118 mg/dL — AB (ref 65–99)
Potassium: 4.5 mmol/L (ref 3.5–5.1)
SODIUM: 141 mmol/L (ref 135–145)

## 2015-08-13 LAB — CBC
HCT: 29.2 % — ABNORMAL LOW (ref 39.0–52.0)
Hemoglobin: 9.8 g/dL — ABNORMAL LOW (ref 13.0–17.0)
MCH: 31.4 pg (ref 26.0–34.0)
MCHC: 33.6 g/dL (ref 30.0–36.0)
MCV: 93.6 fL (ref 78.0–100.0)
PLATELETS: 129 10*3/uL — AB (ref 150–400)
RBC: 3.12 MIL/uL — ABNORMAL LOW (ref 4.22–5.81)
RDW: 15 % (ref 11.5–15.5)
WBC: 7.5 10*3/uL (ref 4.0–10.5)

## 2015-08-13 LAB — HEPARIN LEVEL (UNFRACTIONATED)
Heparin Unfractionated: 0.72 IU/mL — ABNORMAL HIGH (ref 0.30–0.70)
Heparin Unfractionated: 0.71 IU/mL — ABNORMAL HIGH (ref 0.30–0.70)

## 2015-08-13 LAB — TROPONIN I: Troponin I: 1.54 ng/mL (ref <0.031)

## 2015-08-13 MED ORDER — SENNOSIDES-DOCUSATE SODIUM 8.6-50 MG PO TABS
1.0000 | ORAL_TABLET | Freq: Two times a day (BID) | ORAL | Status: DC
  Administered 2015-08-13 – 2015-08-15 (×3): 1 via ORAL
  Filled 2015-08-13 (×3): qty 1

## 2015-08-13 MED ORDER — TECHNETIUM TO 99M ALBUMIN AGGREGATED
4.2000 | Freq: Once | INTRAVENOUS | Status: AC | PRN
  Administered 2015-08-13: 4 via INTRAVENOUS

## 2015-08-13 MED ORDER — ASPIRIN EC 81 MG PO TBEC
81.0000 mg | DELAYED_RELEASE_TABLET | Freq: Every day | ORAL | Status: DC
  Administered 2015-08-13 – 2015-08-15 (×3): 81 mg via ORAL
  Filled 2015-08-13 (×3): qty 1

## 2015-08-13 MED ORDER — SIMETHICONE 40 MG/0.6ML PO SUSP
40.0000 mg | Freq: Four times a day (QID) | ORAL | Status: DC | PRN
  Filled 2015-08-13: qty 0.6

## 2015-08-13 MED ORDER — TICAGRELOR 90 MG PO TABS
180.0000 mg | ORAL_TABLET | Freq: Once | ORAL | Status: AC
  Administered 2015-08-13: 180 mg via ORAL
  Filled 2015-08-13: qty 2

## 2015-08-13 MED ORDER — CARVEDILOL 25 MG PO TABS
25.0000 mg | ORAL_TABLET | Freq: Two times a day (BID) | ORAL | Status: DC
  Administered 2015-08-13 – 2015-08-14 (×2): 25 mg via ORAL
  Filled 2015-08-13 (×2): qty 1

## 2015-08-13 MED ORDER — BISACODYL 10 MG RE SUPP: 10.0000 mg | Freq: Every day | RECTAL | Status: DC | PRN

## 2015-08-13 MED ORDER — TICAGRELOR 90 MG PO TABS
90.0000 mg | ORAL_TABLET | Freq: Two times a day (BID) | ORAL | Status: DC
  Administered 2015-08-13 – 2015-08-15 (×4): 90 mg via ORAL
  Filled 2015-08-13 (×4): qty 1

## 2015-08-13 MED ORDER — TECHNETIUM TC 99M DIETHYLENETRIAME-PENTAACETIC ACID: 31.3000 | Freq: Once | INTRAVENOUS | Status: DC | PRN

## 2015-08-13 NOTE — Progress Notes (Signed)
ANTICOAGULATION CONSULT NOTE  Pharmacy Consult for Heparin Indication: chest pain/ACS  No Known Allergies  Patient Measurements: Height: 5\' 10"  (177.8 cm) Weight: 146 lb 3.2 oz (66.316 kg) IBW/kg (Calculated) : 73 Heparin Dosing Weight: 66.3 kg  Vital Signs: Temp: 98.5 F (36.9 C) (12/26 2336) Temp Source: Oral (12/26 2336) BP: 111/36 mmHg (12/27 0000) Pulse Rate: 61 (12/27 0000)  Labs:  Recent Labs  08/12/15 1242 08/12/15 1245 08/12/15 1615 08/13/15 0039  HGB 10.8*  --   --  9.8*  HCT 32.1*  --   --  29.2*  PLT 133*  --   --  129*  HEPARINUNFRC  --   --   --  0.72*  CREATININE  --  3.89*  --   --   TROPONINI 2.96*  --  3.39*  --     Estimated Creatinine Clearance: 13.3 mL/min (by C-G formula based on Cr of 3.89).  Assessment: 81 yo Male with elevated cardiac markers, ACS vs PE, for heparin  Goal of Therapy:  Heparin level 0.3-0.7 units/ml Monitor platelets by anticoagulation protocol: Yes   Plan:  Will continue heparin at current rate for now as level may decrease following initial bolus.  Recheck level in 8 hours to verify F/U VQ scan results  Phillis Knack, PharmD, BCPS  08/13/2015 1:11 AM

## 2015-08-13 NOTE — Progress Notes (Addendum)
TEAM 1 - Stepdown/ICU TEAM PROGRESS NOTE  TAYQUAN DECOU [id number] DOB: 03/17/1929 DOA: 08/12/2015 PCP: No primary care provider on file.  Admit HPI / Brief Narrative: 81 y.o. male with HTN, CAD s/p non-STEMI in April 2016, chronic combined systolic and diastolic heart failure, and chronic kidney disease stage IV followed by [facility] who developed acute shortness of breath without chest pain or tightness while sitting watching a movie.    HPI/Subjective: The pt feels he is essentially back to normal.  He denies current sob, or cp.  No n/v, abdom pain, or HA.    Assessment/Plan:  Acute onset dyspnea VQ w/o evidence of PE - CXR at admit reportedly suggestive of pulmonary edema but not visible to me in system - VQ does suggest pulmonary edema per Radiologist - cont w/ diuresis and follow   Elevated troponin - NSTEMI Possible NSTEMI as etiology of flash pulmonary edema - Cards recs ongoing medical tx - will cont med tx to include 48hrs of IV heparin - trend troponin through its peak - begin DAPT w/ close monitoring for bleeding given plt count 129   Chronic combined systolic and diastolic CHF  TTE notes EF 30-35% w/ grade 1 DD - admit weight 66.31kg - currently 64.4 - net negative ~2.5L thus far - cont w/ diuresis as able - watch renal function   Essential hypertension BP currently well controlled  Hyperlipidemia Cont home pravachol   Coronary artery disease, s/p anterior wall STEMI in April 2016 followed by Jenne Campus, MD ([place], [place]) - see discussion above    Chronic kidney disease , stage IV had a fistula for years but dialysis hasn't been initiated - crt appears to be at baseline per Nephrology  Normocytic anemia  Likely due to CKD - no evidence of gross blood loss  Hyperkalemia Mild - does not require tx presently - follow   Chronic cholecystitis  Code Status: FULL Family Communication: no family present at time of  exam Disposition Plan: probable d/c home in AM   Consultants: Nephrology  Cardiology - [place]  Procedures: 12/26 TTE - EF 30-35% - grade 1 DD  Antibiotics: none  DVT prophylaxis: IV heparin   Objective: Blood pressure 105/53, pulse 55, temperature 97.9 F (36.6 C), temperature source Oral, resp. rate 17, height 5\' 10"  (1.778 m), weight 64.411 kg (142 lb), SpO2 97 %.  Intake/Output Summary (Last 24 hours) at 08/13/15 0831 Last data filed at 08/13/15 0800  Gross per 24 hour  Intake 129.67 ml  Output   2075 ml  Net -1945.33 ml   Exam: General: No acute respiratory distress at rest presently  Lungs: mild bibasilar crackles - no wheeze  Cardiovascular: Regular rate and rhythm without murmur gallop or rub normal S1 and S2 Abdomen: Nontender, nondistended, soft, bowel sounds positive, no rebound, no ascites, no appreciable mass Extremities: No significant cyanosis, or clubbing, trace edema bilateral lower extremities  Data Reviewed:  Basic Metabolic Panel:  Recent Labs Lab 08/12/15 1245 08/13/15 0039  NA 143 141  K 5.0 4.5  CL 112* 110  CO2 21* 21*  GLUCOSE 130* 118*  BUN 77* 81*  CREATININE 3.89* 4.15*  CALCIUM 9.1 8.8*  PHOS 4.5  --     CBC:  Recent Labs Lab 08/12/15 1242 08/13/15 0039  WBC 8.3 7.5  HGB 10.8* 9.8*  HCT 32.1* 29.2*  MCV 94.1 93.6  PLT 133* 129*    Liver Function Tests:  Recent Labs Lab 08/12/15 1245  ALBUMIN 3.8   Cardiac Enzymes:  Recent Labs Lab 08/12/15 1242 08/12/15 1615  TROPONINI 2.96* 3.39*     Recent Results (from the past 240 hour(s))  MRSA PCR Screening     Status: None   Collection Time: 08/12/15  6:45 AM  Result Value Ref Range Status   MRSA by PCR NEGATIVE NEGATIVE Final    Comment:        The GeneXpert MRSA Assay (FDA approved for NASAL specimens only), is one component of a comprehensive MRSA colonization surveillance program. It is not intended to diagnose MRSA infection nor to guide or monitor  treatment for MRSA infections.      Studies:   Recent x-ray studies have been reviewed in detail by the Attending Physician  Scheduled Meds:  Scheduled Meds: . aspirin  325 mg Oral Daily  . atorvastatin  20 mg Oral q1800  . carvedilol  25 mg Oral BID WC  . isosorbide-hydrALAZINE  1 tablet Oral BID  . sodium chloride  3 mL Intravenous Q12H    Time spent on care of this patient: 35 mins   Brailey Buescher T , MD   [facility] Office  [phone] Pager - Text Page per Shea Evans as per below:  On-Call/Text Page:      [email or web address]      password [email or web address]  If 7PM-7AM, please contact night-coverage [email or web address] Password [email or web address] 08/13/2015, 8:31 AM   LOS: 1 day

## 2015-08-13 NOTE — Progress Notes (Signed)
Gave pt 30day free brilinta card.

## 2015-08-13 NOTE — Progress Notes (Signed)
Admit: 08/12/2015 LOS: 1  34F with acute onset dyspnea, stable CKD4  Subjective:  V/Q negative for PE  Feels well this AM, denies any SOB No LEE Seen by cardiology Troponin downtrending Good UOP Hb stable around 10 -- stable compared to outpt values  12/26 0701 - 12/27 0700 In: 91.7 [I.V.:91.7] Out: 2175 [Urine:2175]  Filed Weights   08/12/15 0640 08/13/15 0342  Weight: 66.316 kg (146 lb 3.2 oz) 64.411 kg (142 lb)    Scheduled Meds: . aspirin  325 mg Oral Daily  . atorvastatin  20 mg Oral q1800  . carvedilol  25 mg Oral BID WC  . isosorbide-hydrALAZINE  1 tablet Oral BID  . sodium chloride  3 mL Intravenous Q12H   Continuous Infusions: . heparin 950 Units/hr (08/13/15 0800)   PRN Meds:.sodium chloride, acetaminophen, ondansetron (ZOFRAN) IV, sodium chloride  Current Labs: reviewed    Physical Exam:  Blood pressure 105/53, pulse 55, temperature 97.9 F (36.6 C), temperature source Oral, resp. rate 17, height 5\' 10"  (1.778 m), weight 64.411 kg (142 lb), SpO2 97 %. NAD RRR CTAB No edema +B/T in R RC AVF  A 1. Acute SOB, neg for PE, likely NSTEMI + pulm edema 2. CKD4, at baseline, mature AVF; no uremia 3. HTN, stable 4. Anemia, stable  P 1. No acute renal issues; stable GFR 2. TRH and Cardiology eval of CV matters 3. Will s/o off for now, please call with any questions or concerns 4. F/u with [name] at our ofice tentative for 08/2015   Pearson Grippe MD 08/13/2015, 9:09 AM   Recent Labs Lab 08/12/15 1245 08/13/15 0039  NA 143 141  K 5.0 4.5  CL 112* 110  CO2 21* 21*  GLUCOSE 130* 118*  BUN 77* 81*  CREATININE 3.89* 4.15*  CALCIUM 9.1 8.8*  PHOS 4.5  --     Recent Labs Lab 08/12/15 1242 08/13/15 0039  WBC 8.3 7.5  HGB 10.8* 9.8*  HCT 32.1* 29.2*  MCV 94.1 93.6  PLT 133* 129*

## 2015-08-13 NOTE — Progress Notes (Signed)
Subjective:  Feels well, no recurrence of shortness of breath. No chest pain, orthopnea, PND, or edema.   Objective:  Vital Signs in the last 24 hours: Temp:  [97.2 F (36.2 C)-98.5 F (36.9 C)] 97.7 F (36.5 C) (12/27 0342) Pulse Rate:  [53-68] 61 (12/27 0400) Resp:  [14-17] 17 (12/27 0400) BP: (88-147)/(36-64) 124/51 mmHg (12/27 0400) SpO2:  [96 %-100 %] 98 % (12/27 0400) Weight:  [64.411 kg (142 lb)] 64.411 kg (142 lb) (12/27 0342)  Intake/Output from previous day: 12/26 0701 - 12/27 0700 In: 91.7 [I.V.:91.7] Out: 2175 [Urine:2175]  Physical Exam: General appearance: alert, cooperative and no distress Neck: no adenopathy, no carotid bruit, no JVD, supple, symmetrical, trachea midline and thyroid not enlarged, symmetric, no tenderness/mass/nodules Lungs: clear to auscultation bilaterally Chest wall: no tenderness Heart: S1, S2 normal, II/VI SEM at apex and LUSB with radiation to carotids Extremities: extremities normal, atraumatic, no cyanosis or edema and AV fistula at right forearm Pulses: feeble lower extremity pulses, skin pink and warm Skin: Skin color, texture, turgor normal. No rashes or lesions Neurologic: Grossly normal  Lab Results: BMP  Recent Labs  08/12/15 1245 08/13/15 0039  NA 143 141  K 5.0 4.5  CL 112* 110  CO2 21* 21*  GLUCOSE 130* 118*  BUN 77* 81*  CREATININE 3.89* 4.15*  CALCIUM 9.1 8.8*  GFRNONAA 13* 12*  GFRAA 15* 14*    CBC  Recent Labs Lab 08/13/15 0039  WBC 7.5  RBC 3.12*  HGB 9.8*  HCT 29.2*  PLT 129*  MCV 93.6  MCH 31.4  MCHC 33.6  RDW 15.0    HEMOGLOBIN A1C No results found for: HGBA1C, MPG  Cardiac Panel (last 3 results)  Recent Labs  08/12/15 1242 08/12/15 1615  TROPONINI 2.96* 3.39*    BNP (last 3 results) No results for input(s): PROBNP in the last 8760 hours.  TSH No results for input(s): TSH in the last 8760 hours.  CHOLESTEROL No results for input(s): CHOL in the last 8760 hours.  Hepatic  Function Panel  Recent Labs  08/12/15 1245  ALBUMIN 3.8    Imaging: No results found.  Cardiac Studies: EKG 08/12/2015: sinus rhythm at a rate of 64bpm, LAE, inferior infarct old, PRWP, TWI in V4 and V5, cannot exclude anterolateral ischemia  Echo 08/12/2015:  - Left ventricle: The cavity size was normal. There was mild concentric hypertrophy. Systolic function was moderately toseverely reduced. The estimated ejection fraction was in the range of 30% to 35%. There is akinesis of themid-apicalanteroseptal and inferoseptal myocardium. Dopplerparameters are consistent with abnormal left ventricularrelaxation (grade 1 diastolic dysfunction). - Aortic valve: Valve mobility was mildly restricted. There was mild stenosis. There was trivial regurgitation. Peak velocity(S): 272 cm/s. Mean gradient (S): 13 mm Hg. Valve area (Vmax): 1.78 cm^2. Valve area (Vmean): 2.12 cm^2. - Aorta: The aorta was mildly calcified. Aortic root dimension: 37mm (ED). - Ascending aorta: The ascending aorta was mildly dilated. - Mitral valve: There was mild regurgitation. Valve area by continuity equation (using LVOT flow): 2.74 cm^2. - Left atrium: The atrium was moderately dilated. - Right ventricle: The cavity size was mildly dilated. Wallthickness was normal. - Right atrium: The atrium was mildly dilated. - Pulmonary arteries: Systolic pressure was mildly increased. PApeak pressure: 42 mm Hg (S).  Assessment/Plan:  1. Shortness of breath 2. Positive troponin 3. Chronic combined systolic and diastolic heart failure 4. Essential hypertension 5. Hyperlipidemia 6. CKD Stage V  Recommendation: No further shortness of breath, no chest pain.  VQ scan still pending.  Will repeat troponin this morning.   Rachel Bo, NP-C 08/13/2015, 8:09 AM [facility] Pager: [phone] Office: [phone]

## 2015-08-13 NOTE — Progress Notes (Signed)
ANTICOAGULATION CONSULT NOTE - Initial Consult  Pharmacy Consult for Heparin Indication: chest pain/ACS  No Known Allergies  Patient Measurements: Height: 5\' 10"  (177.8 cm) Weight: 142 lb (64.411 kg) IBW/kg (Calculated) : 73 Heparin Dosing Weight: 64.4 kg  Vital Signs: Temp: 97.9 F (36.6 C) (12/27 0826) Temp Source: Oral (12/27 0826) BP: 131/99 mmHg (12/27 1100) Pulse Rate: 57 (12/27 1100)  Labs:  Recent Labs  08/12/15 1242 08/12/15 1245 08/12/15 1615 08/13/15 0039 08/13/15 1040  HGB 10.8*  --   --  9.8*  --   HCT 32.1*  --   --  29.2*  --   PLT 133*  --   --  129*  --   HEPARINUNFRC  --   --   --  0.72* 0.71*  CREATININE  --  3.89*  --  4.15*  --   TROPONINI 2.96*  --  3.39*  --   --     Estimated Creatinine Clearance: 12.1 mL/min (by C-G formula based on Cr of 4.15).   Medical History: Past Medical History  Diagnosis Date  . Hypertension   . Chronic kidney disease (CKD), stage IV (severe) ([place])   . Dyslipidemia   . Acute on chronic combined systolic (congestive) and diastolic (congestive) heart failure ([place])   . CAD (coronary artery disease)   . Hyperlipidemia     Medications:  Prescriptions prior to admission  Medication Sig Dispense Refill Last Dose  . allopurinol (ZYLOPRIM) 100 MG tablet Take 100 mg by mouth daily.     Marland Kitchen aspirin 325 MG EC tablet Take 325 mg by mouth daily.     . calcitRIOL (ROCALTROL) 0.25 MCG capsule Take 0.25 mcg by mouth daily.     . carvedilol (COREG) 25 MG tablet Take 25 mg by mouth 2 (two) times daily with a meal.     . ezetimibe (ZETIA) 10 MG tablet Take 10 mg by mouth daily.     . furosemide (LASIX) 20 MG tablet Take 10 mg by mouth.     . isosorbide-hydrALAZINE (BIDIL) 20-37.5 MG tablet Take 1 tablet by mouth 3 (three) times daily.     Marland Kitchen omega-3 acid ethyl esters (LOVAZA) 1 G capsule Take 2 g by mouth 2 (two) times daily.     Marland Kitchen omeprazole (PRILOSEC) 40 MG capsule Take 40 mg by mouth daily.     . pravastatin (PRAVACHOL) 40  MG tablet Take 40 mg by mouth daily.       Assessment: 81 yo M who presented to [facility] with increased DOE over the last 2 days.Pt was transferred to [facility] for evaluation and found to have elevated trop 2.96. **Per MD, plan to d/c IV heparin after 48hrs of tx is completed. Hep gtt started 12/26 @1700 , should stop gtt 12/28 @1700   HL 0.71 (slightly supratherapeutic), Hgb 9.8, plt 129.  Goal of Therapy:  Heparin level 0.3-0.7 units/ml Monitor platelets by anticoagulation protocol: Yes   Plan:  Decrease Heparin infusion to 900 units/hr Heparin level in 8 hours Heparin level and CBC daily while on heparin  Darl Pikes, PharmD Clinical Pharmacist- Resident Pager: [phone]  08/13/2015 11:36 AM

## 2015-08-14 LAB — RENAL FUNCTION PANEL
ANION GAP: 12 (ref 5–15)
Albumin: 3.5 g/dL (ref 3.5–5.0)
BUN: 85 mg/dL — ABNORMAL HIGH (ref 6–20)
CHLORIDE: 108 mmol/L (ref 101–111)
CO2: 20 mmol/L — AB (ref 22–32)
Calcium: 9 mg/dL (ref 8.9–10.3)
Creatinine, Ser: 4.23 mg/dL — ABNORMAL HIGH (ref 0.61–1.24)
GFR calc Af Amer: 14 mL/min — ABNORMAL LOW (ref >60)
GFR calc non Af Amer: 12 mL/min — ABNORMAL LOW (ref >60)
GLUCOSE: 111 mg/dL — AB (ref 65–99)
POTASSIUM: 4.9 mmol/L (ref 3.5–5.1)
Phosphorus: 3.8 mg/dL (ref 2.5–4.6)
SODIUM: 140 mmol/L (ref 135–145)

## 2015-08-14 LAB — CBC
HEMATOCRIT: 30.8 % — AB (ref 39.0–52.0)
Hemoglobin: 10 g/dL — ABNORMAL LOW (ref 13.0–17.0)
MCH: 30.6 pg (ref 26.0–34.0)
MCHC: 32.5 g/dL (ref 30.0–36.0)
MCV: 94.2 fL (ref 78.0–100.0)
Platelets: 132 10*3/uL — ABNORMAL LOW (ref 150–400)
RBC: 3.27 MIL/uL — ABNORMAL LOW (ref 4.22–5.81)
RDW: 14.8 % (ref 11.5–15.5)
WBC: 7.7 10*3/uL (ref 4.0–10.5)

## 2015-08-14 LAB — TROPONIN I: Troponin I: 1.31 ng/mL (ref <0.031)

## 2015-08-14 LAB — HEPARIN LEVEL (UNFRACTIONATED): HEPARIN UNFRACTIONATED: 0.69 [IU]/mL (ref 0.30–0.70)

## 2015-08-14 MED ORDER — MAGNESIUM CITRATE PO SOLN
0.5000 | Freq: Once | ORAL | Status: DC | PRN
  Filled 2015-08-14: qty 296

## 2015-08-14 MED ORDER — POLYETHYLENE GLYCOL 3350 17 G PO PACK
17.0000 g | PACK | Freq: Two times a day (BID) | ORAL | Status: DC
  Administered 2015-08-14: 17 g via ORAL
  Filled 2015-08-14: qty 1

## 2015-08-14 MED ORDER — CARVEDILOL 6.25 MG PO TABS
6.2500 mg | ORAL_TABLET | Freq: Two times a day (BID) | ORAL | Status: DC
  Administered 2015-08-14 – 2015-08-15 (×2): 6.25 mg via ORAL
  Filled 2015-08-14 (×2): qty 1

## 2015-08-14 MED ORDER — HEPARIN SODIUM (PORCINE) 5000 UNIT/ML IJ SOLN
5000.0000 [IU] | Freq: Three times a day (TID) | INTRAMUSCULAR | Status: DC
  Administered 2015-08-14: 5000 [IU] via SUBCUTANEOUS
  Filled 2015-08-14: qty 1

## 2015-08-14 NOTE — Progress Notes (Addendum)
[place] TEAM 1 - Stepdown/ICU TEAM PROGRESS NOTE  SAMMUAL LONGWITH [id number] DOB: Jan 12, 1929 DOA: 08/12/2015 PCP: No primary care provider on file.  Admit HPI / Brief Narrative: 81 y.o. male with HTN, CAD s/p non-STEMI in April 2016, chronic combined systolic and diastolic heart failure, and chronic kidney disease stage IV followed by [facility] who developed acute shortness of breath without chest pain or tightness while sitting watching a movie.    HPI/Subjective: Pt c/o severe epigastric discomfort and "gas pains."  He feels he is severely constipated.  He denies SSCP, sob, n/v, or ha.  He has not been up and ambulating more than just to the bathroom.    Assessment/Plan:  Acute onset dyspnea VQ w/o evidence of PE - CXR at admit reportedly suggestive of pulmonary edema but not visible to me in system - VQ does suggest pulmonary edema per Radiologist - dyspnea has resolved at this time   Elevated troponin - NSTEMI Suspect NSTEMI as etiology of flash pulmonary edema - Cards recs ongoing medical tx - will cont med tx to include 48hrs of IV heparin which will end at [place] today - troponin peaked at 3.39 and is now on a clear downward trend - began DAPT w/ close monitoring for bleeding given plt count 129   Chronic combined systolic and diastolic CHF  TTE notes EF 30-35% w/ grade 1 DD - admit weight 66.31kg - currently 65.1 - net negative ~2.6L thus far - plan was to cont w/ diuresis at d/c but given climbing crt and hypotension, will need to hold for now - watch renal function   Essential hypertension patient hypotensive today - hold diuresis - adjust meds - follow trend   Hyperlipidemia Cont home pravachol   Coronary artery disease, s/p anterior wall STEMI in April 2016 followed by Jenne Campus, MD ([place], [place]) - see discussion above    Chronic kidney disease , stage IV had a fistula for years but dialysis hasn't been initiated - crt has climbed w/  diuresis - follow   Normocytic anemia  Likely due to CKD - no evidence of gross blood loss  Hyperkalemia Mild - does not require tx presently - follow   Severe constipation Possibly a consequence of diuresis - stimulate bowels - follow trend   Chronic cholecystitis  Code Status: DO NOT INTUBATE Family Communication: no family present at time of exam Disposition Plan: possible d/c home in AM 12/29   Consultants: Nephrology  Cardiology - Einar Gip  Procedures: 12/26 TTE - EF 30-35% - grade 1 DD  Antibiotics: none  DVT prophylaxis: IV heparin   Objective: Blood pressure 84/28, pulse 55, temperature 98.5 F (36.9 C), temperature source Oral, resp. rate 16, height 5\' 10"  (1.778 m), weight 65.1 kg (143 lb 8.3 oz), SpO2 99 %.  Intake/Output Summary (Last 24 hours) at 08/14/15 1449 Last data filed at 08/14/15 1204  Gross per 24 hour  Intake    384 ml  Output    620 ml  Net   -236 ml   Exam: General: No acute respiratory distress at rest  Lungs: CTA th/o w/o wheeze or crackles  Cardiovascular: Regular rate and rhythm without murmur gallop or rub Abdomen: Nontender, nondistended, soft, bowel sounds positive, no rebound, no ascites, no appreciable mass Extremities: No significant cyanosis, or clubbing, trace edema bilateral lower extremities  Data Reviewed:  Basic Metabolic Panel:  Recent Labs Lab 08/12/15 1245 08/13/15 0039 08/14/15 0234  NA 143 141 140  K  5.0 4.5 4.9  CL 112* 110 108  CO2 21* 21* 20*  GLUCOSE 130* 118* 111*  BUN 77* 81* 85*  CREATININE 3.89* 4.15* 4.23*  CALCIUM 9.1 8.8* 9.0  PHOS 4.5  --  3.8    CBC:  Recent Labs Lab 08/12/15 1242 08/13/15 0039 08/14/15 0234  WBC 8.3 7.5 7.7  HGB 10.8* 9.8* 10.0*  HCT 32.1* 29.2* 30.8*  MCV 94.1 93.6 94.2  PLT 133* 129* 132*    Liver Function Tests:  Recent Labs Lab 08/12/15 1245 08/14/15 0234  ALBUMIN 3.8 3.5   Cardiac Enzymes:  Recent Labs Lab 08/12/15 1242 08/12/15 1615  08/13/15 1040 08/14/15 0234  TROPONINI 2.96* 3.39* 1.54* 1.31*     Recent Results (from the past 240 hour(s))  MRSA PCR Screening     Status: None   Collection Time: 08/12/15  6:45 AM  Result Value Ref Range Status   MRSA by PCR NEGATIVE NEGATIVE Final    Comment:        The GeneXpert MRSA Assay (FDA approved for NASAL specimens only), is one component of a comprehensive MRSA colonization surveillance program. It is not intended to diagnose MRSA infection nor to guide or monitor treatment for MRSA infections.      Studies:   Recent x-ray studies have been reviewed in detail by the Attending Physician  Scheduled Meds:  Scheduled Meds: . aspirin EC  81 mg Oral Daily  . atorvastatin  20 mg Oral q1800  . carvedilol  25 mg Oral BID WC  . isosorbide-hydrALAZINE  1 tablet Oral BID  . senna-docusate  1 tablet Oral BID  . ticagrelor  90 mg Oral BID    Time spent on care of this patient: 35 mins   MCCLUNG,JEFFREY T , MD   [facility] Office  [phone] Pager - Text Page per Shea Evans as per below:  On-Call/Text Page:      [email or web address]      password [email or web address]  If 7PM-7AM, please contact night-coverage [email or web address] Password [email or web address] 08/14/2015, 2:49 PM   LOS: 2 days

## 2015-08-14 NOTE — Progress Notes (Signed)
ANTICOAGULATION CONSULT NOTE   Pharmacy Consult for Heparin Indication: chest pain/ACS  No Known Allergies  Patient Measurements: Height: 5\' 10"  (177.8 cm) Weight: 143 lb 8.3 oz (65.1 kg) IBW/kg (Calculated) : 73 Heparin Dosing Weight: 64.4 kg  Vital Signs: Temp: 97.6 F (36.4 C) (12/28 0342) Temp Source: Oral (12/28 0342) BP: 119/54 mmHg (12/28 0600) Pulse Rate: 65 (12/28 0600)  Labs:  Recent Labs  08/12/15 1242 08/12/15 1245 08/12/15 1615 08/13/15 0039 08/13/15 1040 08/14/15 0234  HGB 10.8*  --   --  9.8*  --  10.0*  HCT 32.1*  --   --  29.2*  --  30.8*  PLT 133*  --   --  129*  --  132*  HEPARINUNFRC  --   --   --  0.72* 0.71* 0.69  CREATININE  --  3.89*  --  4.15*  --  4.23*  TROPONINI 2.96*  --  3.39*  --  1.54* 1.31*    Estimated Creatinine Clearance: 12 mL/min (by C-G formula based on Cr of 4.23).   Medical History: Past Medical History  Diagnosis Date  . Hypertension   . Chronic kidney disease (CKD), stage IV (severe) ([place])   . Dyslipidemia   . Acute on chronic combined systolic (congestive) and diastolic (congestive) heart failure ([place])   . CAD (coronary artery disease)   . Hyperlipidemia    Assessment: 81 yo M who presented to [facility] with increased DOE over the last 2 days.Pt was transferred to [facility] for evaluation and found to have elevated trop 2.96. **Per MD, plan to d/c IV heparin after 48hrs of tx is completed. Hep gtt started 12/26 @1700 , should stop gtt 12/28 @1700   HL 0.69 (at goal), Hgb 10, plt 132  VQ scan was normal  Goal of Therapy:  Heparin level 0.3-0.7 units/ml Monitor platelets by anticoagulation protocol: Yes   Plan:  Decrease Heparin infusion to 800 units/hr Will enter stop time for heparin gtt for this afternoon at 48h mark. Will check cbc in am  Erin Hearing PharmD., [facility] Clinical Pharmacist Pager [phone] 08/14/2015 7:58 AM

## 2015-08-15 DIAGNOSIS — I2510 Atherosclerotic heart disease of native coronary artery without angina pectoris: Secondary | ICD-10-CM | POA: Diagnosis present

## 2015-08-15 DIAGNOSIS — I5043 Acute on chronic combined systolic (congestive) and diastolic (congestive) heart failure: Secondary | ICD-10-CM

## 2015-08-15 DIAGNOSIS — I10 Essential (primary) hypertension: Secondary | ICD-10-CM

## 2015-08-15 DIAGNOSIS — R0600 Dyspnea, unspecified: Secondary | ICD-10-CM

## 2015-08-15 DIAGNOSIS — E785 Hyperlipidemia, unspecified: Secondary | ICD-10-CM

## 2015-08-15 DIAGNOSIS — I214 Non-ST elevation (NSTEMI) myocardial infarction: Secondary | ICD-10-CM | POA: Diagnosis present

## 2015-08-15 DIAGNOSIS — N184 Chronic kidney disease, stage 4 (severe): Secondary | ICD-10-CM

## 2015-08-15 LAB — RENAL FUNCTION PANEL
ALBUMIN: 3.3 g/dL — AB (ref 3.5–5.0)
ANION GAP: 12 (ref 5–15)
BUN: 88 mg/dL — AB (ref 6–20)
CO2: 20 mmol/L — AB (ref 22–32)
Calcium: 9 mg/dL (ref 8.9–10.3)
Chloride: 109 mmol/L (ref 101–111)
Creatinine, Ser: 4.15 mg/dL — ABNORMAL HIGH (ref 0.61–1.24)
GFR calc Af Amer: 14 mL/min — ABNORMAL LOW (ref >60)
GFR calc non Af Amer: 12 mL/min — ABNORMAL LOW (ref >60)
GLUCOSE: 98 mg/dL (ref 65–99)
POTASSIUM: 5 mmol/L (ref 3.5–5.1)
Phosphorus: 5 mg/dL — ABNORMAL HIGH (ref 2.5–4.6)
SODIUM: 141 mmol/L (ref 135–145)

## 2015-08-15 LAB — CBC
HCT: 30.1 % — ABNORMAL LOW (ref 39.0–52.0)
Hemoglobin: 9.7 g/dL — ABNORMAL LOW (ref 13.0–17.0)
MCH: 30.6 pg (ref 26.0–34.0)
MCHC: 32.2 g/dL (ref 30.0–36.0)
MCV: 95 fL (ref 78.0–100.0)
Platelets: 129 K/uL — ABNORMAL LOW (ref 150–400)
RBC: 3.17 MIL/uL — ABNORMAL LOW (ref 4.22–5.81)
RDW: 14.7 % (ref 11.5–15.5)
WBC: 6.6 K/uL (ref 4.0–10.5)

## 2015-08-15 MED ORDER — ISOSORB DINITRATE-HYDRALAZINE 20-37.5 MG PO TABS: 1.0000 | ORAL_TABLET | Freq: Two times a day (BID) | ORAL | Status: DC

## 2015-08-15 MED ORDER — ATORVASTATIN CALCIUM 20 MG PO TABS: 20.0000 mg | ORAL_TABLET | Freq: Every day | ORAL | Status: DC

## 2015-08-15 MED ORDER — CARVEDILOL 6.25 MG PO TABS: 6.2500 mg | ORAL_TABLET | Freq: Two times a day (BID) | ORAL | Status: DC

## 2015-08-15 MED ORDER — TICAGRELOR 90 MG PO TABS: 90.0000 mg | ORAL_TABLET | Freq: Two times a day (BID) | ORAL | Status: DC

## 2015-08-15 NOTE — Progress Notes (Signed)
Spoke w pt. He gets meds [facility] [address] [place]. They can order and have within 24-48 hrs. i have explained this to pt but he must get prescription fill and not wait on them to order. He has 30day free card. [facility] [address] [facility] and to be sure he gets before he leaves town.

## 2015-08-15 NOTE — Progress Notes (Signed)
Utilization Review Completed.Tanner Pearson T12/29/2016  

## 2015-08-15 NOTE — Discharge Summary (Signed)
Physician Discharge Summary  Tanner Pearson [id number] DOB: 1929-04-17 DOA: 08/12/2015  PCP: No primary care provider on file.  Admit date: 08/12/2015 Discharge date: 08/15/2015  Time spent: 35 minutes  Recommendations for Outpatient Follow-up:  Acute onset dyspnea -VQ w/o evidence of PE - CXR and VQ scan at admit reportedly suggestive of pulmonary edema  -Patient was diuresed -2.3 L  Elevated troponin - NSTEMI -Suspect NSTEMI as etiology of flash pulmonary edema  - Cardiology recommends ongoing medical tx  - will cont med tx to include 48hrs of IV heparin which will end at 5PM today - troponin peaked at 3.39 and continue to trend down. At discharge patient asymptomatic  -Follow-up with Dr [facility]ne [facility] in 1-2 weeks.    Chronic combined systolic and diastolic CHF  -TTE notes EF 30-35% w/ grade 1 DD - admit weight 66.31kg      Discharge weight= 65 kg -Patient will weigh himself as soon as he returns home today, and each a.m. and record findings and log book. If patient gains more than 3 pounds in a 24-hour period he is to contact his cardiologist for instructions on starting diuretic. -Continue Coreg 6.25 mg BID -Bidil 20-30 7.5 mg BID -Brilinta 90 mg BID  Essential hypertension -Controlled. Cardiologist to restart diuretic    Hyperlipidemia -Continue Lipitor 20 mg daily   Coronary artery disease, s/p anterior wall STEMI in April 2016 -followed by Jenne Campus, MD ([place], [place])    Chronic kidney disease , stage IV -had a fistula for years but dialysis hasn't been initiated -Follow-up with PCP  Normocytic anemia  -Likely due to CKD - no evidence of gross blood loss  Chronic cholecystitis  Sleep apnea?  -Recommend that Dr Jenne [facility] refer for sleep study.    Discharge Diagnoses:  Active Problems:   Essential hypertension   Hyperlipidemia   CAD (coronary artery disease)   Chronic kidney disease  (CKD), stage IV (severe) (HCC)   Chronic combined systolic and diastolic congestive heart failure (HCC)   Dyspnea   Acute on chronic combined systolic (congestive) and diastolic (congestive) heart failure (H[facility])   Shortness of breath   CAD in native artery   NSTEMI (non-ST elevated myocardial infarction) [facility])   Discharge Condition: Stable  Diet recommendation: Heart Healthy  Filed Weights   08/13/15 0342 08/14/15 0342 08/15/15 0500  Weight: 64.411 kg (142 lb) 65.1 kg (143 lb 8.3 oz) 65 kg (143 lb 4.8 oz)    History of present illness:  81 y.o. WM PMHx HTN, CAD s/p non-STEMI in April 2016, chronic combined systolic and diastolic CHF, and CKD Stage IV followed by [facility] who developed acute shortness of breath without chest pain or tightness while sitting watching a movie.  During his hospitalization patient was treated for NSTEMI which resulted and flash pulmonary edema. Patient was treated medically and is now asymptomatic. NOTE; patient reports symptoms consistent with sleep apnea. States whenever he falls asleep laying flat on his back will awake up gasping for air. No being evaluated for sleep apnea.   Procedures: 12/26 TTE - EF 30-35% - grade 1 DD  Consultations: Nephrology  Cardiology - [place]     Discharge Exam: Filed Vitals:   08/15/15 0400 08/15/15 0500 08/15/15 0715 08/15/15 1205  BP: 107/37  116/89 133/52  Pulse: 57  57 59  Temp: 97.6 F (36.4 C)  97.7 F (36.5 C) 97.3 F (36.3 C)  TempSrc: Oral  Oral Oral  Resp: 16  20  18  Height:      Weight:  65 kg (143 lb 4.8 oz)    SpO2: 100%  100% 99%    General: A/O 4, NAD Cardiovascular: Regular rhythm and rate, positive systolic murmur grade 3/6 (chronic), normal S1/S2 Respiratory: Clear to auscultation bilateral  Discharge Instructions     Medication List    STOP taking these medications        ezetimibe 10 MG tablet  Commonly known as:  ZETIA     furosemide 20 MG tablet  Commonly known  as:  LASIX     pravastatin 40 MG tablet  Commonly known as:  PRAVACHOL     sodium bicarbonate 650 MG tablet      TAKE these medications        allopurinol 100 MG tablet  Commonly known as:  ZYLOPRIM  Take 100 mg by mouth daily.     aspirin 325 MG EC tablet  Take 325 mg by mouth daily.     atorvastatin 20 MG tablet  Commonly known as:  LIPITOR  Take 1 tablet (20 mg total) by mouth daily at 6 PM.     calcitRIOL 0.25 MCG capsule  Commonly known as:  ROCALTROL  Take 0.25 mcg by mouth daily.     carvedilol 6.25 MG tablet  Commonly known as:  COREG  Take 1 tablet (6.25 mg total) by mouth 2 (two) times daily with a meal.     isosorbide-hydrALAZINE 20-37.5 MG tablet  Commonly known as:  BIDIL  Take 1 tablet by mouth 2 (two) times daily.     omega-3 acid ethyl esters 1 g capsule  Commonly known as:  LOVAZA  Take 2 g by mouth 2 (two) times daily.     omeprazole 40 MG capsule  Commonly known as:  PRILOSEC  Take 40 mg by mouth daily.     ticagrelor 90 MG Tabs tablet  Commonly known as:  BRILINTA  Take 1 tablet (90 mg total) by mouth 2 (two) times daily.       No Known Allergies Follow-up Information    Follow up with KRASOWSKI,ROBERT, MD.   Specialty:  Cardiology   Why:  Schedule Follow-up with Dr Jenne [facility] in 1-2 weeks   Contact information:   [address] [place] [address] [phone]        The results of significant diagnostics from this hospitalization (including imaging, microbiology, ancillary and laboratory) are listed below for reference.    Significant Diagnostic Studies: Nm Pulmonary Perf And Vent  08/13/2015  CLINICAL DATA:  Shortness of breath for past few days, history hypertension, chronic kidney disease stage IV, coronary artery disease, acute on chronic combined systolic and diastolic CHF, hyperlipidemia EXAM: NUCLEAR MEDICINE VENTILATION - PERFUSION LUNG SCAN TECHNIQUE: Ventilation images were obtained in  multiple projections using inhaled aerosol Tc-16m DTPA. Perfusion images were obtained in multiple projections after intravenous injection of Tc-75m MAA. RADIOPHARMACEUTICALS:  123456 millicuries [id number] DTPA aerosol inhalation and 4.2 millicuries [id number] MAA IV COMPARISON:  None; correlation chest radiograph 08/12/2015 FINDINGS: Ventilation: Numerous peripheral areas of diminished ventilation throughout both lungs. Small to moderate subsegmental ventilation defects in both lower lobes. Perfusion: Normal Chest radiograph demonstrates enlargement of cardiac silhouette, pulmonary vascular congestion and scattered pulmonary infiltrates likely representing CHF. IMPRESSION: Normal perfusion lung scan. Irregular peripheral ventilation throughout both lungs with small subsegmental ventilatory defects in the lower lobes, likely on the basis of pulmonary edema as seen on chest radiograph. Electronically Signed   By:  Lavonia Dana M.D.   On: 08/13/2015 09:55    Microbiology: Recent Results (from the past 240 hour(s))  MRSA PCR Screening     Status: None   Collection Time: 08/12/15  6:45 AM  Result Value Ref Range Status   MRSA by PCR NEGATIVE NEGATIVE Final    Comment:        The GeneXpert MRSA Assay (FDA approved for NASAL specimens only), is one component of a comprehensive MRSA colonization surveillance program. It is not intended to diagnose MRSA infection nor to guide or monitor treatment for MRSA infections.      Labs: Basic Metabolic Panel:  Recent Labs Lab 08/12/15 1245 08/13/15 0039 08/14/15 0234 08/15/15 0240  NA 143 141 140 141  K 5.0 4.5 4.9 5.0  CL 112* 110 108 109  CO2 21* 21* 20* 20*  GLUCOSE 130* 118* 111* 98  BUN 77* 81* 85* 88*  CREATININE 3.89* 4.15* 4.23* 4.15*  CALCIUM 9.1 8.8* 9.0 9.0  PHOS 4.5  --  3.8 5.0*   Liver Function Tests:  Recent Labs Lab 08/12/15 1245 08/14/15 0234 08/15/15 0240  ALBUMIN 3.8 3.5 3.3*   No results for input(s): LIPASE,  AMYLASE in the last 168 hours. No results for input(s): AMMONIA in the last 168 hours. CBC:  Recent Labs Lab 08/12/15 1242 08/13/15 0039 08/14/15 0234 08/15/15 0240  WBC 8.3 7.5 7.7 6.6  HGB 10.8* 9.8* 10.0* 9.7*  HCT 32.1* 29.2* 30.8* 30.1*  MCV 94.1 93.6 94.2 95.0  PLT 133* 129* 132* 129*   Cardiac Enzymes:  Recent Labs Lab 08/12/15 1242 08/12/15 1615 08/13/15 1040 08/14/15 0234  TROPONINI 2.96* 3.39* 1.54* 1.31*   BNP: BNP (last 3 results) No results for input(s): BNP in the last 8760 hours.  ProBNP (last 3 results) No results for input(s): PROBNP in the last 8760 hours.  CBG: No results for input(s): GLUCAP in the last 168 hours.     Signed:  Dia Crawford, MD [facility] [phone] pager

## 2015-08-15 NOTE — Care Management Important Message (Signed)
Important Message  Patient Details  Name: Tanner Pearson MRN: [id number] Date of Birth: 1928-12-06   Medicare Important Message Given:  Yes    Carolyne Whitsel P [place] 08/15/2015, 12:29 PM

## 2015-08-29 DIAGNOSIS — I2510 Atherosclerotic heart disease of native coronary artery without angina pectoris: Secondary | ICD-10-CM | POA: Diagnosis not present

## 2015-08-29 DIAGNOSIS — K811 Chronic cholecystitis: Secondary | ICD-10-CM | POA: Diagnosis not present

## 2015-08-29 DIAGNOSIS — I429 Cardiomyopathy, unspecified: Secondary | ICD-10-CM | POA: Diagnosis not present

## 2015-09-06 DIAGNOSIS — I120 Hypertensive chronic kidney disease with stage 5 chronic kidney disease or end stage renal disease: Secondary | ICD-10-CM | POA: Diagnosis not present

## 2015-09-06 DIAGNOSIS — I770 Arteriovenous fistula, acquired: Secondary | ICD-10-CM | POA: Diagnosis not present

## 2015-09-06 DIAGNOSIS — I429 Cardiomyopathy, unspecified: Secondary | ICD-10-CM | POA: Diagnosis not present

## 2015-09-06 DIAGNOSIS — D649 Anemia, unspecified: Secondary | ICD-10-CM | POA: Diagnosis not present

## 2015-09-06 DIAGNOSIS — E785 Hyperlipidemia, unspecified: Secondary | ICD-10-CM | POA: Diagnosis not present

## 2015-09-06 DIAGNOSIS — N184 Chronic kidney disease, stage 4 (severe): Secondary | ICD-10-CM | POA: Diagnosis not present

## 2015-09-06 DIAGNOSIS — N2581 Secondary hyperparathyroidism of renal origin: Secondary | ICD-10-CM | POA: Diagnosis not present

## 2015-09-06 DIAGNOSIS — M109 Gout, unspecified: Secondary | ICD-10-CM | POA: Diagnosis not present

## 2015-09-06 DIAGNOSIS — N185 Chronic kidney disease, stage 5: Secondary | ICD-10-CM | POA: Diagnosis not present

## 2015-09-06 DIAGNOSIS — I214 Non-ST elevation (NSTEMI) myocardial infarction: Secondary | ICD-10-CM | POA: Diagnosis not present

## 2015-10-17 DIAGNOSIS — I429 Cardiomyopathy, unspecified: Secondary | ICD-10-CM | POA: Diagnosis not present

## 2015-10-17 DIAGNOSIS — E785 Hyperlipidemia, unspecified: Secondary | ICD-10-CM | POA: Diagnosis not present

## 2015-10-17 DIAGNOSIS — N185 Chronic kidney disease, stage 5: Secondary | ICD-10-CM | POA: Diagnosis not present

## 2015-10-17 DIAGNOSIS — N2581 Secondary hyperparathyroidism of renal origin: Secondary | ICD-10-CM | POA: Diagnosis not present

## 2015-10-17 DIAGNOSIS — I214 Non-ST elevation (NSTEMI) myocardial infarction: Secondary | ICD-10-CM | POA: Diagnosis not present

## 2015-10-17 DIAGNOSIS — D649 Anemia, unspecified: Secondary | ICD-10-CM | POA: Diagnosis not present

## 2015-10-17 DIAGNOSIS — M109 Gout, unspecified: Secondary | ICD-10-CM | POA: Diagnosis not present

## 2015-10-17 DIAGNOSIS — I120 Hypertensive chronic kidney disease with stage 5 chronic kidney disease or end stage renal disease: Secondary | ICD-10-CM | POA: Diagnosis not present

## 2015-10-17 DIAGNOSIS — I770 Arteriovenous fistula, acquired: Secondary | ICD-10-CM | POA: Diagnosis not present

## 2015-10-29 DIAGNOSIS — M25862 Other specified joint disorders, left knee: Secondary | ICD-10-CM | POA: Diagnosis not present

## 2015-11-04 DIAGNOSIS — I2510 Atherosclerotic heart disease of native coronary artery without angina pectoris: Secondary | ICD-10-CM | POA: Diagnosis present

## 2015-11-04 DIAGNOSIS — F17210 Nicotine dependence, cigarettes, uncomplicated: Secondary | ICD-10-CM | POA: Diagnosis present

## 2015-11-04 DIAGNOSIS — I129 Hypertensive chronic kidney disease with stage 1 through stage 4 chronic kidney disease, or unspecified chronic kidney disease: Secondary | ICD-10-CM | POA: Diagnosis not present

## 2015-11-04 DIAGNOSIS — D631 Anemia in chronic kidney disease: Secondary | ICD-10-CM | POA: Diagnosis not present

## 2015-11-04 DIAGNOSIS — E7800 Pure hypercholesterolemia, unspecified: Secondary | ICD-10-CM | POA: Diagnosis present

## 2015-11-04 DIAGNOSIS — M1990 Unspecified osteoarthritis, unspecified site: Secondary | ICD-10-CM | POA: Diagnosis present

## 2015-11-04 DIAGNOSIS — N184 Chronic kidney disease, stage 4 (severe): Secondary | ICD-10-CM | POA: Diagnosis not present

## 2015-11-04 DIAGNOSIS — N189 Chronic kidney disease, unspecified: Secondary | ICD-10-CM | POA: Diagnosis not present

## 2015-11-04 DIAGNOSIS — R739 Hyperglycemia, unspecified: Secondary | ICD-10-CM | POA: Diagnosis not present

## 2015-11-04 DIAGNOSIS — R7989 Other specified abnormal findings of blood chemistry: Secondary | ICD-10-CM | POA: Diagnosis not present

## 2015-11-04 DIAGNOSIS — Q613 Polycystic kidney, unspecified: Secondary | ICD-10-CM | POA: Diagnosis not present

## 2015-11-04 DIAGNOSIS — R069 Unspecified abnormalities of breathing: Secondary | ICD-10-CM | POA: Diagnosis not present

## 2015-11-04 DIAGNOSIS — I160 Hypertensive urgency: Secondary | ICD-10-CM | POA: Diagnosis not present

## 2015-11-04 DIAGNOSIS — Z7982 Long term (current) use of aspirin: Secondary | ICD-10-CM | POA: Diagnosis not present

## 2015-11-04 DIAGNOSIS — I509 Heart failure, unspecified: Secondary | ICD-10-CM | POA: Diagnosis not present

## 2015-11-04 DIAGNOSIS — I5023 Acute on chronic systolic (congestive) heart failure: Secondary | ICD-10-CM | POA: Diagnosis not present

## 2015-11-04 DIAGNOSIS — R0602 Shortness of breath: Secondary | ICD-10-CM | POA: Diagnosis not present

## 2015-11-04 DIAGNOSIS — Z79899 Other long term (current) drug therapy: Secondary | ICD-10-CM | POA: Diagnosis not present

## 2015-11-04 DIAGNOSIS — M109 Gout, unspecified: Secondary | ICD-10-CM | POA: Diagnosis present

## 2015-11-04 DIAGNOSIS — K219 Gastro-esophageal reflux disease without esophagitis: Secondary | ICD-10-CM | POA: Diagnosis present

## 2015-11-04 DIAGNOSIS — I130 Hypertensive heart and chronic kidney disease with heart failure and stage 1 through stage 4 chronic kidney disease, or unspecified chronic kidney disease: Secondary | ICD-10-CM | POA: Diagnosis not present

## 2015-11-13 DIAGNOSIS — M179 Osteoarthritis of knee, unspecified: Secondary | ICD-10-CM | POA: Diagnosis not present

## 2015-11-13 DIAGNOSIS — R937 Abnormal findings on diagnostic imaging of other parts of musculoskeletal system: Secondary | ICD-10-CM | POA: Diagnosis not present

## 2015-11-13 DIAGNOSIS — M25862 Other specified joint disorders, left knee: Secondary | ICD-10-CM | POA: Diagnosis not present

## 2015-11-14 DIAGNOSIS — M25862 Other specified joint disorders, left knee: Secondary | ICD-10-CM | POA: Diagnosis not present

## 2015-11-14 DIAGNOSIS — I509 Heart failure, unspecified: Secondary | ICD-10-CM | POA: Diagnosis not present

## 2015-11-14 DIAGNOSIS — I2510 Atherosclerotic heart disease of native coronary artery without angina pectoris: Secondary | ICD-10-CM | POA: Diagnosis not present

## 2015-11-21 DIAGNOSIS — I429 Cardiomyopathy, unspecified: Secondary | ICD-10-CM | POA: Diagnosis not present

## 2015-11-21 DIAGNOSIS — I2510 Atherosclerotic heart disease of native coronary artery without angina pectoris: Secondary | ICD-10-CM | POA: Diagnosis not present

## 2015-11-21 DIAGNOSIS — K811 Chronic cholecystitis: Secondary | ICD-10-CM | POA: Diagnosis not present

## 2015-11-26 DIAGNOSIS — N185 Chronic kidney disease, stage 5: Secondary | ICD-10-CM | POA: Diagnosis not present

## 2015-12-31 DIAGNOSIS — D649 Anemia, unspecified: Secondary | ICD-10-CM | POA: Diagnosis not present

## 2015-12-31 DIAGNOSIS — I120 Hypertensive chronic kidney disease with stage 5 chronic kidney disease or end stage renal disease: Secondary | ICD-10-CM | POA: Diagnosis not present

## 2015-12-31 DIAGNOSIS — N2581 Secondary hyperparathyroidism of renal origin: Secondary | ICD-10-CM | POA: Diagnosis not present

## 2015-12-31 DIAGNOSIS — N185 Chronic kidney disease, stage 5: Secondary | ICD-10-CM | POA: Diagnosis not present

## 2015-12-31 DIAGNOSIS — I770 Arteriovenous fistula, acquired: Secondary | ICD-10-CM | POA: Diagnosis not present

## 2015-12-31 DIAGNOSIS — I214 Non-ST elevation (NSTEMI) myocardial infarction: Secondary | ICD-10-CM | POA: Diagnosis not present

## 2016-01-14 DIAGNOSIS — D638 Anemia in other chronic diseases classified elsewhere: Secondary | ICD-10-CM | POA: Diagnosis not present

## 2016-01-14 DIAGNOSIS — N185 Chronic kidney disease, stage 5: Secondary | ICD-10-CM | POA: Diagnosis not present

## 2016-01-29 DIAGNOSIS — I2510 Atherosclerotic heart disease of native coronary artery without angina pectoris: Secondary | ICD-10-CM | POA: Diagnosis not present

## 2016-01-29 DIAGNOSIS — I429 Cardiomyopathy, unspecified: Secondary | ICD-10-CM | POA: Diagnosis not present

## 2016-01-29 DIAGNOSIS — K811 Chronic cholecystitis: Secondary | ICD-10-CM | POA: Diagnosis not present

## 2016-02-06 ENCOUNTER — Encounter: Payer: Self-pay | Admitting: Cardiology

## 2016-02-06 DIAGNOSIS — K811 Chronic cholecystitis: Secondary | ICD-10-CM | POA: Diagnosis not present

## 2016-02-06 DIAGNOSIS — I429 Cardiomyopathy, unspecified: Secondary | ICD-10-CM | POA: Diagnosis not present

## 2016-02-06 DIAGNOSIS — I2510 Atherosclerotic heart disease of native coronary artery without angina pectoris: Secondary | ICD-10-CM | POA: Diagnosis not present

## 2016-02-13 DIAGNOSIS — D649 Anemia, unspecified: Secondary | ICD-10-CM | POA: Diagnosis not present

## 2016-02-13 DIAGNOSIS — I770 Arteriovenous fistula, acquired: Secondary | ICD-10-CM | POA: Diagnosis not present

## 2016-02-13 DIAGNOSIS — I120 Hypertensive chronic kidney disease with stage 5 chronic kidney disease or end stage renal disease: Secondary | ICD-10-CM | POA: Diagnosis not present

## 2016-02-13 DIAGNOSIS — N185 Chronic kidney disease, stage 5: Secondary | ICD-10-CM | POA: Diagnosis not present

## 2016-02-13 DIAGNOSIS — N2581 Secondary hyperparathyroidism of renal origin: Secondary | ICD-10-CM | POA: Diagnosis not present

## 2016-03-24 DIAGNOSIS — I770 Arteriovenous fistula, acquired: Secondary | ICD-10-CM | POA: Diagnosis not present

## 2016-03-24 DIAGNOSIS — D649 Anemia, unspecified: Secondary | ICD-10-CM | POA: Diagnosis not present

## 2016-03-24 DIAGNOSIS — I214 Non-ST elevation (NSTEMI) myocardial infarction: Secondary | ICD-10-CM | POA: Diagnosis not present

## 2016-03-24 DIAGNOSIS — I120 Hypertensive chronic kidney disease with stage 5 chronic kidney disease or end stage renal disease: Secondary | ICD-10-CM | POA: Diagnosis not present

## 2016-03-24 DIAGNOSIS — N2581 Secondary hyperparathyroidism of renal origin: Secondary | ICD-10-CM | POA: Diagnosis not present

## 2016-03-24 DIAGNOSIS — N185 Chronic kidney disease, stage 5: Secondary | ICD-10-CM | POA: Diagnosis not present

## 2016-04-16 ENCOUNTER — Other Ambulatory Visit: Payer: Self-pay

## 2016-06-01 DIAGNOSIS — D649 Anemia, unspecified: Secondary | ICD-10-CM | POA: Diagnosis not present

## 2016-06-01 DIAGNOSIS — N185 Chronic kidney disease, stage 5: Secondary | ICD-10-CM | POA: Diagnosis not present

## 2016-06-01 DIAGNOSIS — I120 Hypertensive chronic kidney disease with stage 5 chronic kidney disease or end stage renal disease: Secondary | ICD-10-CM | POA: Diagnosis not present

## 2016-06-01 DIAGNOSIS — I770 Arteriovenous fistula, acquired: Secondary | ICD-10-CM | POA: Diagnosis not present

## 2016-06-01 DIAGNOSIS — N2581 Secondary hyperparathyroidism of renal origin: Secondary | ICD-10-CM | POA: Diagnosis not present

## 2016-06-01 DIAGNOSIS — I214 Non-ST elevation (NSTEMI) myocardial infarction: Secondary | ICD-10-CM | POA: Diagnosis not present

## 2016-06-09 DIAGNOSIS — D638 Anemia in other chronic diseases classified elsewhere: Secondary | ICD-10-CM | POA: Diagnosis not present

## 2016-06-09 DIAGNOSIS — N185 Chronic kidney disease, stage 5: Secondary | ICD-10-CM | POA: Diagnosis not present

## 2016-08-04 DIAGNOSIS — D649 Anemia, unspecified: Secondary | ICD-10-CM | POA: Diagnosis not present

## 2016-08-04 DIAGNOSIS — I214 Non-ST elevation (NSTEMI) myocardial infarction: Secondary | ICD-10-CM | POA: Diagnosis not present

## 2016-08-04 DIAGNOSIS — N2581 Secondary hyperparathyroidism of renal origin: Secondary | ICD-10-CM | POA: Diagnosis not present

## 2016-08-04 DIAGNOSIS — I120 Hypertensive chronic kidney disease with stage 5 chronic kidney disease or end stage renal disease: Secondary | ICD-10-CM | POA: Diagnosis not present

## 2016-08-04 DIAGNOSIS — N185 Chronic kidney disease, stage 5: Secondary | ICD-10-CM | POA: Diagnosis not present

## 2016-08-04 DIAGNOSIS — I770 Arteriovenous fistula, acquired: Secondary | ICD-10-CM | POA: Diagnosis not present

## 2016-10-07 DIAGNOSIS — R531 Weakness: Secondary | ICD-10-CM | POA: Diagnosis not present

## 2016-10-07 DIAGNOSIS — E872 Acidosis: Secondary | ICD-10-CM | POA: Diagnosis not present

## 2016-10-07 DIAGNOSIS — I509 Heart failure, unspecified: Secondary | ICD-10-CM | POA: Diagnosis not present

## 2016-10-07 DIAGNOSIS — I2510 Atherosclerotic heart disease of native coronary artery without angina pectoris: Secondary | ICD-10-CM | POA: Diagnosis not present

## 2016-10-07 DIAGNOSIS — R404 Transient alteration of awareness: Secondary | ICD-10-CM | POA: Diagnosis not present

## 2016-10-07 DIAGNOSIS — D638 Anemia in other chronic diseases classified elsewhere: Secondary | ICD-10-CM | POA: Diagnosis not present

## 2016-10-07 DIAGNOSIS — D631 Anemia in chronic kidney disease: Secondary | ICD-10-CM | POA: Diagnosis not present

## 2016-10-07 DIAGNOSIS — I129 Hypertensive chronic kidney disease with stage 1 through stage 4 chronic kidney disease, or unspecified chronic kidney disease: Secondary | ICD-10-CM | POA: Diagnosis not present

## 2016-10-07 DIAGNOSIS — N184 Chronic kidney disease, stage 4 (severe): Secondary | ICD-10-CM | POA: Diagnosis not present

## 2016-10-07 DIAGNOSIS — R05 Cough: Secondary | ICD-10-CM | POA: Diagnosis not present

## 2016-10-15 DIAGNOSIS — I770 Arteriovenous fistula, acquired: Secondary | ICD-10-CM | POA: Diagnosis not present

## 2016-10-15 DIAGNOSIS — I214 Non-ST elevation (NSTEMI) myocardial infarction: Secondary | ICD-10-CM | POA: Diagnosis not present

## 2016-10-15 DIAGNOSIS — I120 Hypertensive chronic kidney disease with stage 5 chronic kidney disease or end stage renal disease: Secondary | ICD-10-CM | POA: Diagnosis not present

## 2016-10-15 DIAGNOSIS — N2581 Secondary hyperparathyroidism of renal origin: Secondary | ICD-10-CM | POA: Diagnosis not present

## 2016-10-15 DIAGNOSIS — N185 Chronic kidney disease, stage 5: Secondary | ICD-10-CM | POA: Diagnosis not present

## 2016-10-15 DIAGNOSIS — D649 Anemia, unspecified: Secondary | ICD-10-CM | POA: Diagnosis not present

## 2016-10-27 DIAGNOSIS — N185 Chronic kidney disease, stage 5: Secondary | ICD-10-CM | POA: Diagnosis not present

## 2016-10-27 DIAGNOSIS — D631 Anemia in chronic kidney disease: Secondary | ICD-10-CM | POA: Diagnosis not present

## 2017-03-15 ENCOUNTER — Ambulatory Visit: Payer: Medicare Other | Admitting: Cardiology

## 2017-03-22 ENCOUNTER — Ambulatory Visit (INDEPENDENT_AMBULATORY_CARE_PROVIDER_SITE_OTHER): Payer: Medicare Other | Admitting: Cardiology

## 2017-03-22 ENCOUNTER — Encounter: Payer: Self-pay | Admitting: Cardiology

## 2017-03-22 VITALS — BP 140/62 | HR 64 | Resp 12 | Ht 68.0 in | Wt 142.4 lb

## 2017-03-22 DIAGNOSIS — I255 Ischemic cardiomyopathy: Secondary | ICD-10-CM | POA: Diagnosis not present

## 2017-03-22 DIAGNOSIS — I10 Essential (primary) hypertension: Secondary | ICD-10-CM | POA: Diagnosis not present

## 2017-03-22 DIAGNOSIS — E782 Mixed hyperlipidemia: Secondary | ICD-10-CM | POA: Diagnosis not present

## 2017-03-22 DIAGNOSIS — I2510 Atherosclerotic heart disease of native coronary artery without angina pectoris: Secondary | ICD-10-CM | POA: Diagnosis not present

## 2017-03-22 MED ORDER — CARVEDILOL 25 MG PO TABS: 25.0000 mg | ORAL_TABLET | Freq: Two times a day (BID) | ORAL | 3 refills | Status: DC

## 2017-03-22 MED ORDER — PRAVASTATIN SODIUM 40 MG PO TABS: 40.0000 mg | ORAL_TABLET | Freq: Every day | ORAL | 3 refills | Status: DC

## 2017-03-22 NOTE — Patient Instructions (Addendum)
Medication Instructions:  Your physician recommends that you continue on your current medications as directed. Please refer to the Current Medication list given to you today.  Labwork: Your physician recommends that you have lab work: Lipid Panel, kidney function test to see your kidney function and sodium and potassium.   Testing/Procedures: None   Follow-Up: Your physician recommends that you schedule a follow-up appointment in: 6 months   Any Other Special Instructions Will Be Listed Below (If Applicable).  Please note that any paperwork needing to be filled out by the provider will need to be addressed at the front desk prior to seeing the provider. Please note that any paperwork FMLA, Disability or other documents regarding health condition is subject to a $25.00 charge that must be received prior to completion of paperwork.    If you need a refill on your cardiac medications before your next appointment, please call your pharmacy.

## 2017-03-22 NOTE — Progress Notes (Signed)
Cardiology Office Note:    Date:  03/22/2017   ID:  Tanner Pearson, DOB 1929-05-23, MRN [id number]  PCP:  Ernestene Kiel, MD  Cardiologist:  Jenne Campus, MD    Referring MD: Ernestene Kiel, MD   Chief Complaint  Patient presents with  . Follow-up  I'm here to follow-up  History of Present Illness:    Tanner Pearson is a 83 y.o. male  with coronary artery disease. He is doing well. Denies having any chest pain tightness squeezing pressure burning chest. He has to take care of his sick wife. And he is very involved in it. Because of this he is very active. He follows with nephrologist.  Past Medical History:  Diagnosis Date  . Acute on chronic combined systolic (congestive) and diastolic (congestive) heart failure ([place])   . CAD (coronary artery disease)   . Chronic kidney disease (CKD), stage IV (severe) ([place])   . Dyslipidemia   . Hyperlipidemia   . Hypertension     Past Surgical History:  Procedure Laterality Date  . AV FISTULA PLACEMENT, RADIOCEPHALIC    . CATARACT EXTRACTION      Current Medications: Current Meds  Medication Sig  . allopurinol (ZYLOPRIM) 100 MG tablet Take 100 mg by mouth daily.  Marland Kitchen aspirin 325 MG EC tablet Take 325 mg by mouth daily.  . carvedilol (COREG) 25 MG tablet Take 1 tablet by mouth 2 (two) times daily.  . isosorbide-hydrALAZINE (BIDIL) 20-37.5 MG tablet Take 1 tablet by mouth 2 (two) times daily.  Marland Kitchen omega-3 acid ethyl esters (LOVAZA) 1 G capsule Take 2 g by mouth 2 (two) times daily.  Marland Kitchen omeprazole (PRILOSEC) 40 MG capsule Take 40 mg by mouth daily.  . pravastatin (PRAVACHOL) 40 MG tablet Take 1 tablet by mouth at bedtime.  . sodium bicarbonate 650 MG tablet Take 1 tablet by mouth 3 (three) times daily.  . ticagrelor (BRILINTA) 90 MG TABS tablet Take 1 tablet (90 mg total) by mouth 2 (two) times daily.     Allergies:   Patient has no known allergies.   Social History   Social History  . Marital status: Married    Spouse  name: N/A  . Number of children: N/A  . Years of education: N/A   Social History Main Topics  . Smoking status: Former Research scientist (life sciences)  . Smokeless tobacco: Never Used  . Alcohol use No  . Drug use: No  . Sexual activity: Not Asked   Other Topics Concern  . None   Social History Narrative  . None     Family History: The patient's family history includes Hypertension in his mother; Polycystic kidney disease in his brother and sister; Stroke in his mother. ROS:   Please see the history of present illness.    All 14 point review of systems negative except as described per history of present illness  EKGs/Labs/Other Studies Reviewed:      Recent Labs: No results found for requested labs within last 8760 hours.  Recent Lipid Panel No results found for: CHOL, TRIG, HDL, CHOLHDL, VLDL, LDLCALC, LDLDIRECT  Physical Exam:    VS:  BP 140/62   Pulse 64   Resp 12   Ht 5\' 8"  (1.727 m)   Wt 142 lb 6.4 oz (64.6 kg)   BMI 21.65 kg/m     Wt Readings from Last 3 Encounters:  03/22/17 142 lb 6.4 oz (64.6 kg)  08/15/15 143 lb 4.8 oz (65 kg)     GEN:  Well nourished, well developed in no acute distress HEENT: Normal NECK: No JVD; No carotid bruits LYMPHATICS: No lymphadenopathy CARDIAC: RRR, There is systolic musical murmur best heard left part of his sternum grade 3/6, no rubs, no gallops RESPIRATORY:  Clear to auscultation without rales, wheezing or rhonchi  ABDOMEN: Soft, non-tender, non-distended MUSCULOSKELETAL:  No edema; No deformity  SKIN: Warm and dry LOWER EXTREMITIES: no swelling NEUROLOGIC:  Alert and oriented x 3 PSYCHIATRIC:  Normal affect   ASSESSMENT:    1. Essential hypertension   2. Coronary artery disease involving native coronary artery of native heart without angina pectoris   3. Ischemic cardiomyopathy   4. Mixed hyperlipidemia    PLAN:    In order of problems listed above:  Essential hypertension: His blood pressure is reasonably controlled I will  continue present management. Coronary artery disease: Doing well from that point of view. Denies having any chest pain tightness squeezing pressure burning chest. He is very active taking care of his sick wife and have no difficulty doing this. Ischemic coronary myopathy: I will retrieve his echocardiogram from our old office he had test done in November last year. Dyslipidemia: I will ask him to have fasting lipid profile done at his primary care physician office.  I will ask him to have EKG done today for completion.  Medication Adjustments/Labs and Tests Ordered: Current medicines are reviewed at length with the patient today.  Concerns regarding medicines are outlined above.  No orders of the defined types were placed in this encounter.  Medication changes: No orders of the defined types were placed in this encounter.   Signed, Park Liter, MD, [facility] 03/22/2017 3:46 PM    [place]

## 2017-07-29 ENCOUNTER — Other Ambulatory Visit: Payer: Self-pay | Admitting: Cardiology

## 2017-12-07 DIAGNOSIS — I214 Non-ST elevation (NSTEMI) myocardial infarction: Secondary | ICD-10-CM | POA: Diagnosis not present

## 2017-12-07 DIAGNOSIS — N189 Chronic kidney disease, unspecified: Secondary | ICD-10-CM | POA: Diagnosis not present

## 2017-12-07 DIAGNOSIS — N185 Chronic kidney disease, stage 5: Secondary | ICD-10-CM | POA: Diagnosis not present

## 2017-12-07 DIAGNOSIS — D631 Anemia in chronic kidney disease: Secondary | ICD-10-CM | POA: Diagnosis not present

## 2017-12-07 DIAGNOSIS — I120 Hypertensive chronic kidney disease with stage 5 chronic kidney disease or end stage renal disease: Secondary | ICD-10-CM | POA: Diagnosis not present

## 2017-12-07 DIAGNOSIS — N2581 Secondary hyperparathyroidism of renal origin: Secondary | ICD-10-CM | POA: Diagnosis not present

## 2017-12-07 DIAGNOSIS — I770 Arteriovenous fistula, acquired: Secondary | ICD-10-CM | POA: Diagnosis not present

## 2018-02-10 DIAGNOSIS — I770 Arteriovenous fistula, acquired: Secondary | ICD-10-CM | POA: Diagnosis not present

## 2018-02-10 DIAGNOSIS — N2581 Secondary hyperparathyroidism of renal origin: Secondary | ICD-10-CM | POA: Diagnosis not present

## 2018-02-10 DIAGNOSIS — N185 Chronic kidney disease, stage 5: Secondary | ICD-10-CM | POA: Diagnosis not present

## 2018-02-10 DIAGNOSIS — I120 Hypertensive chronic kidney disease with stage 5 chronic kidney disease or end stage renal disease: Secondary | ICD-10-CM | POA: Diagnosis not present

## 2018-02-10 DIAGNOSIS — N189 Chronic kidney disease, unspecified: Secondary | ICD-10-CM | POA: Diagnosis not present

## 2018-02-10 DIAGNOSIS — D631 Anemia in chronic kidney disease: Secondary | ICD-10-CM | POA: Diagnosis not present

## 2018-02-10 DIAGNOSIS — I214 Non-ST elevation (NSTEMI) myocardial infarction: Secondary | ICD-10-CM | POA: Diagnosis not present

## 2018-03-08 ENCOUNTER — Other Ambulatory Visit: Payer: Self-pay | Admitting: Cardiology

## 2018-03-22 ENCOUNTER — Encounter: Payer: Self-pay | Admitting: Cardiology

## 2018-03-22 ENCOUNTER — Ambulatory Visit (INDEPENDENT_AMBULATORY_CARE_PROVIDER_SITE_OTHER): Payer: Medicare Other | Admitting: Cardiology

## 2018-03-22 VITALS — BP 122/58 | HR 66 | Resp 14 | Ht 70.0 in | Wt 141.8 lb

## 2018-03-22 DIAGNOSIS — E782 Mixed hyperlipidemia: Secondary | ICD-10-CM

## 2018-03-22 DIAGNOSIS — I10 Essential (primary) hypertension: Secondary | ICD-10-CM | POA: Diagnosis not present

## 2018-03-22 DIAGNOSIS — I255 Ischemic cardiomyopathy: Secondary | ICD-10-CM | POA: Diagnosis not present

## 2018-03-22 DIAGNOSIS — I2510 Atherosclerotic heart disease of native coronary artery without angina pectoris: Secondary | ICD-10-CM

## 2018-03-22 DIAGNOSIS — I5042 Chronic combined systolic (congestive) and diastolic (congestive) heart failure: Secondary | ICD-10-CM

## 2018-03-22 MED ORDER — ASPIRIN EC 81 MG PO TBEC: 81.0000 mg | DELAYED_RELEASE_TABLET | Freq: Every day | ORAL | Status: DC

## 2018-03-22 MED ORDER — PRAVASTATIN SODIUM 40 MG PO TABS: 40.0000 mg | ORAL_TABLET | Freq: Every day | ORAL | 1 refills | Status: DC

## 2018-03-22 MED ORDER — ISOSORB DINITRATE-HYDRALAZINE 20-37.5 MG PO TABS: 1.0000 | ORAL_TABLET | Freq: Three times a day (TID) | ORAL | 1 refills | Status: DC

## 2018-03-22 MED ORDER — CARVEDILOL 25 MG PO TABS: 25.0000 mg | ORAL_TABLET | Freq: Two times a day (BID) | ORAL | 1 refills | Status: DC

## 2018-03-22 NOTE — Patient Instructions (Signed)
Medication Instructions:  Your physician has recommended you make the following change in your medication:   STOP: Brilinta CHANGE: 81 mg enteric coated aspirin daily. Bidil 1 tablet 3 times daily.   Labwork: None.  Testing/Procedures: Your physician has requested that you have an echocardiogram. Echocardiography is a painless test that uses sound waves to create images of your heart. It provides your doctor with information about the size and shape of your heart and how well your heart's chambers and valves are working. This procedure takes approximately one hour. There are no restrictions for this procedure.    Follow-Up: Your physician wants you to follow-up in: 3 months. You will receive a reminder letter in the mail two months in advance. If you don't receive a letter, please call our office to schedule the follow-up appointment.   Any Other Special Instructions Will Be Listed Below (If Applicable).     If you need a refill on your cardiac medications before your next appointment, please call your pharmacy.  Echocardiogram An echocardiogram, or echocardiography, uses sound waves (ultrasound) to produce an image of your heart. The echocardiogram is simple, painless, obtained within a short period of time, and offers valuable information to your health care provider. The images from an echocardiogram can provide information such as:  Evidence of coronary artery disease (CAD).  Heart size.  Heart muscle function.  Heart valve function.  Aneurysm detection.  Evidence of a past heart attack.  Fluid buildup around the heart.  Heart muscle thickening.  Assess heart valve function.  Tell a health care provider about:  Any allergies you have.  All medicines you are taking, including vitamins, herbs, eye drops, creams, and over-the-counter medicines.  Any problems you or family members have had with anesthetic medicines.  Any blood disorders you have.  Any surgeries  you have had.  Any medical conditions you have.  Whether you are pregnant or may be pregnant. What happens before the procedure? No special preparation is needed. Eat and drink normally. What happens during the procedure?  In order to produce an image of your heart, gel will be applied to your chest and a wand-like tool (transducer) will be moved over your chest. The gel will help transmit the sound waves from the transducer. The sound waves will harmlessly bounce off your heart to allow the heart images to be captured in real-time motion. These images will then be recorded.  You may need an IV to receive a medicine that improves the quality of the pictures. What happens after the procedure? You may return to your normal schedule including diet, activities, and medicines, unless your health care provider tells you otherwise. This information is not intended to replace advice given to you by your health care provider. Make sure you discuss any questions you have with your health care provider. Document Released: 07/31/2000 Document Revised: 03/21/2016 Document Reviewed: 04/10/2013 Elsevier Interactive Patient Education  2017 [employer].

## 2018-03-22 NOTE — Progress Notes (Signed)
Cardiology Office Note:    Date:  03/22/2018   ID:  Tanner Pearson, DOB Dec 22, 1928, MRN [id number]  PCP:  Ernestene Kiel, MD  Cardiologist:  Jenne Campus, MD    Referring MD: Ernestene Kiel, MD   Chief Complaint  Patient presents with  . Follow-up  Doing well cardiac wise  History of Present Illness:    Tanner Pearson is a 84 y.o. male with ischemic cardiomyopathy ejection fraction 4045%.  Comes today to my office for follow-up overall doing well cardiac wise denies have any chest pain tightness squeezing pressure burning chest no swelling of lower extremities.  In March he lost his wife of 54 years and he is grieving after that.  He said it is a very difficult time.  Slowly getting better but still struggling.  Does not have any suicidal idealization.    Past Medical History:  Diagnosis Date  . Acute on chronic combined systolic (congestive) and diastolic (congestive) heart failure ([place])   . CAD (coronary artery disease)   . Chronic kidney disease (CKD), stage IV (severe) ([place])   . Dyslipidemia   . Hyperlipidemia   . Hypertension     Past Surgical History:  Procedure Laterality Date  . AV FISTULA PLACEMENT, RADIOCEPHALIC    . CATARACT EXTRACTION      Current Medications: Current Meds  Medication Sig  . allopurinol (ZYLOPRIM) 100 MG tablet Take 100 mg by mouth daily.  Marland Kitchen aspirin 325 MG EC tablet Take 325 mg by mouth daily.  Marland Kitchen BRILINTA 90 MG TABS tablet TAKE 1 TABLET TWICE A DAY  . carvedilol (COREG) 25 MG tablet TAKE 1 TABLET BY MOUTH TWICE A DAY  . isosorbide-hydrALAZINE (BIDIL) 20-37.5 MG tablet Take 1 tablet by mouth 2 (two) times daily. (Patient taking differently: Take 1 tablet by mouth daily. )  . omeprazole (PRILOSEC) 40 MG capsule Take 40 mg by mouth daily as needed.   . pravastatin (PRAVACHOL) 40 MG tablet TAKE 1 TABLET BY MOUTH AT BEDTIME  . sodium bicarbonate 650 MG tablet Take 2 tablets by mouth once.      Allergies:   Patient has no known  allergies.   Social History   Socioeconomic History  . Marital status: Married    Spouse name: Not on file  . Number of children: Not on file  . Years of education: Not on file  . Highest education level: Not on file  Occupational History  . Not on file  Social Needs  . Financial resource strain: Not on file  . Food insecurity:    Worry: Not on file    Inability: Not on file  . Transportation needs:    Medical: Not on file    Non-medical: Not on file  Tobacco Use  . Smoking status: Former Research scientist (life sciences)  . Smokeless tobacco: Never Used  Substance and Sexual Activity  . Alcohol use: Yes    Comment: occasional beer  . Drug use: No  . Sexual activity: Not on file  Lifestyle  . Physical activity:    Days per week: Not on file    Minutes per session: Not on file  . Stress: Not on file  Relationships  . Social connections:    Talks on phone: Not on file    Gets together: Not on file    Attends religious service: Not on file    Active member of club or organization: Not on file    Attends meetings of clubs or organizations: Not on file  Relationship status: Not on file  Other Topics Concern  . Not on file  Social History Narrative  . Not on file     Family History: The patient's family history includes Hypertension in his mother; Polycystic kidney disease in his brother and sister; Stroke in his mother. ROS:   Please see the history of present illness.    All 14 point review of systems negative except as described per history of present illness  EKGs/Labs/Other Studies Reviewed:      Recent Labs: No results found for requested labs within last 8760 hours.  Recent Lipid Panel No results found for: CHOL, TRIG, HDL, CHOLHDL, VLDL, LDLCALC, LDLDIRECT  Physical Exam:    VS:  BP (!) 122/58 (BP Location: Left Arm, Patient Position: Sitting)   Pulse 66   Resp 14   Ht 5\' 10"  (1.778 m)   Wt 141 lb 12.8 oz (64.3 kg)   SpO2 97%   BMI 20.35 kg/m     Wt Readings from  Last 3 Encounters:  03/22/18 141 lb 12.8 oz (64.3 kg)  03/22/17 142 lb 6.4 oz (64.6 kg)  08/15/15 143 lb 4.8 oz (65 kg)     GEN:  Well nourished, well developed in no acute distress HEENT: Normal NECK: No JVD; No carotid bruits LYMPHATICS: No lymphadenopathy CARDIAC: RRR, no murmurs, no rubs, no gallops RESPIRATORY:  Clear to auscultation without rales, wheezing or rhonchi  ABDOMEN: Soft, non-tender, non-distended MUSCULOSKELETAL:  No edema; No deformity  SKIN: Warm and dry LOWER EXTREMITIES: no swelling NEUROLOGIC:  Alert and oriented x 3 PSYCHIATRIC:  Normal affect   ASSESSMENT:    1. Ischemic cardiomyopathy   2. Chronic combined systolic and diastolic congestive heart failure ([place])   3. Mixed hyperlipidemia   4. Coronary artery disease involving native coronary artery of native heart without angina pectoris   5. Essential hypertension    PLAN:    In order of problems listed above:  1. Ischemic cardia myopathy and appropriate medications which I will continue.  I will ask him to have echocardiogram done to recheck left ventricular ejection fraction. 2. Chronic combined systolic and diastolic congestive heart failure [place] [employer] 2/3 on appropriate medications. 3. Mixed dyslipidemia followed by internal medicine team I will call primary care physician to get a copy of fasting lipid profile. 4. Essential hypertension blood pressure well controlled continue present medications. 5. Chronic kidney failure: Follow-up by nephrology. 6. He is still on Brilinta Brilinta need to be discontinue I will also lower the dose of his aspirin.  See him back in my office in 3 months sooner if he get a problem   Medication Adjustments/Labs and Tests Ordered: Current medicines are reviewed at length with the patient today.  Concerns regarding medicines are outlined above.  No orders of the defined types were placed in this encounter.  Medication changes: No orders of the  defined types were placed in this encounter.   Signed, Park Liter, MD, [facility] 03/22/2018 9:10 AM    [place]

## 2018-03-23 ENCOUNTER — Other Ambulatory Visit: Payer: Self-pay

## 2018-03-23 ENCOUNTER — Ambulatory Visit (INDEPENDENT_AMBULATORY_CARE_PROVIDER_SITE_OTHER): Payer: Medicare Other

## 2018-03-23 DIAGNOSIS — I2510 Atherosclerotic heart disease of native coronary artery without angina pectoris: Secondary | ICD-10-CM | POA: Diagnosis not present

## 2018-03-23 DIAGNOSIS — I255 Ischemic cardiomyopathy: Secondary | ICD-10-CM | POA: Diagnosis not present

## 2018-03-23 NOTE — Progress Notes (Signed)
Complete echocardiogram has been performed.  Jimmy Vanden Fawaz RDCS 

## 2018-04-06 ENCOUNTER — Other Ambulatory Visit: Payer: Self-pay | Admitting: Cardiology

## 2018-06-09 DIAGNOSIS — N185 Chronic kidney disease, stage 5: Secondary | ICD-10-CM | POA: Diagnosis not present

## 2018-06-09 DIAGNOSIS — I214 Non-ST elevation (NSTEMI) myocardial infarction: Secondary | ICD-10-CM | POA: Diagnosis not present

## 2018-06-09 DIAGNOSIS — Z23 Encounter for immunization: Secondary | ICD-10-CM | POA: Diagnosis not present

## 2018-06-09 DIAGNOSIS — D631 Anemia in chronic kidney disease: Secondary | ICD-10-CM | POA: Diagnosis not present

## 2018-06-09 DIAGNOSIS — N189 Chronic kidney disease, unspecified: Secondary | ICD-10-CM | POA: Diagnosis not present

## 2018-06-09 DIAGNOSIS — I770 Arteriovenous fistula, acquired: Secondary | ICD-10-CM | POA: Diagnosis not present

## 2018-06-09 DIAGNOSIS — I350 Nonrheumatic aortic (valve) stenosis: Secondary | ICD-10-CM | POA: Diagnosis not present

## 2018-06-09 DIAGNOSIS — N2581 Secondary hyperparathyroidism of renal origin: Secondary | ICD-10-CM | POA: Diagnosis not present

## 2018-06-09 DIAGNOSIS — I120 Hypertensive chronic kidney disease with stage 5 chronic kidney disease or end stage renal disease: Secondary | ICD-10-CM | POA: Diagnosis not present

## 2018-06-27 ENCOUNTER — Ambulatory Visit: Payer: Medicare Other | Admitting: Cardiology

## 2018-07-04 ENCOUNTER — Other Ambulatory Visit: Payer: Self-pay

## 2018-08-03 ENCOUNTER — Ambulatory Visit (INDEPENDENT_AMBULATORY_CARE_PROVIDER_SITE_OTHER): Payer: Medicare Other | Admitting: Cardiology

## 2018-08-03 ENCOUNTER — Encounter: Payer: Self-pay | Admitting: Cardiology

## 2018-08-03 VITALS — BP 110/60 | HR 58 | Ht 69.0 in | Wt 144.8 lb

## 2018-08-03 DIAGNOSIS — I5042 Chronic combined systolic (congestive) and diastolic (congestive) heart failure: Secondary | ICD-10-CM | POA: Diagnosis not present

## 2018-08-03 DIAGNOSIS — I10 Essential (primary) hypertension: Secondary | ICD-10-CM

## 2018-08-03 DIAGNOSIS — E782 Mixed hyperlipidemia: Secondary | ICD-10-CM | POA: Diagnosis not present

## 2018-08-03 DIAGNOSIS — I255 Ischemic cardiomyopathy: Secondary | ICD-10-CM | POA: Diagnosis not present

## 2018-08-03 NOTE — Progress Notes (Signed)
Cardiology Office Note:    Date:  08/03/2018   ID:  Tanner Pearson, DOB June 20, 1929, MRN [id number]  PCP:  Ernestene Kiel, MD  Cardiologist:  Jenne Campus, MD    Referring MD: Ernestene Kiel, MD   Chief Complaint  Patient presents with  . Follow-up  Doing well  History of Present Illness:    Tanner Pearson is a 84 y.o. male with coronary artery disease, cardiomyopathy ejection fraction 40% last assessment comes to our office for follow-up also does have significant kidney dysfunction overall cardiac wise doing well no chest pain tightness squeezing pressure burning chest.  Past Medical History:  Diagnosis Date  . Acute on chronic combined systolic (congestive) and diastolic (congestive) heart failure ([place])   . CAD (coronary artery disease)   . Chronic kidney disease (CKD), stage IV (severe) ([place])   . Dyslipidemia   . Hyperlipidemia   . Hypertension     Past Surgical History:  Procedure Laterality Date  . AV FISTULA PLACEMENT, RADIOCEPHALIC    . CATARACT EXTRACTION      Current Medications: Current Meds  Medication Sig  . allopurinol (ZYLOPRIM) 100 MG tablet Take 100 mg by mouth daily.  Marland Kitchen aspirin EC 81 MG tablet Take 81 mg by mouth daily.  . carvedilol (COREG) 25 MG tablet TAKE 1 TABLET BY MOUTH TWICE DAILY  . isosorbide-hydrALAZINE (BIDIL) 20-37.5 MG tablet Take 1 tablet by mouth 3 (three) times daily.  Marland Kitchen omeprazole (PRILOSEC) 40 MG capsule Take 40 mg by mouth daily as needed.   . pravastatin (PRAVACHOL) 40 MG tablet Take 1 tablet (40 mg total) by mouth at bedtime.  . sodium bicarbonate 650 MG tablet Take 2 tablets by mouth once.   . [DISCONTINUED] aspirin EC 81 MG tablet Take 1 tablet (81 mg total) by mouth daily.     Allergies:   Patient has no known allergies.   Social History   Socioeconomic History  . Marital status: Married    Spouse name: Not on file  . Number of children: Not on file  . Years of education: Not on file  . Highest education  level: Not on file  Occupational History  . Not on file  Social Needs  . Financial resource strain: Not on file  . Food insecurity:    Worry: Not on file    Inability: Not on file  . Transportation needs:    Medical: Not on file    Non-medical: Not on file  Tobacco Use  . Smoking status: Former Research scientist (life sciences)  . Smokeless tobacco: Never Used  Substance and Sexual Activity  . Alcohol use: Yes    Comment: occasional beer  . Drug use: No  . Sexual activity: Not on file  Lifestyle  . Physical activity:    Days per week: Not on file    Minutes per session: Not on file  . Stress: Not on file  Relationships  . Social connections:    Talks on phone: Not on file    Gets together: Not on file    Attends religious service: Not on file    Active member of club or organization: Not on file    Attends meetings of clubs or organizations: Not on file    Relationship status: Not on file  Other Topics Concern  . Not on file  Social History Narrative  . Not on file     Family History: The patient's family history includes Hypertension in his mother; Polycystic kidney disease in his brother  and sister; Stroke in his mother. ROS:   Please see the history of present illness.    All 14 point review of systems negative except as described per history of present illness  EKGs/Labs/Other Studies Reviewed:      Recent Labs: No results found for requested labs within last 8760 hours.  Recent Lipid Panel No results found for: CHOL, TRIG, HDL, CHOLHDL, VLDL, LDLCALC, LDLDIRECT  Physical Exam:    VS:  BP 110/60   Pulse (!) 58   Ht 5\' 9"  (1.753 m)   Wt 144 lb 12.8 oz (65.7 kg)   SpO2 98%   BMI 21.38 kg/m     Wt Readings from Last 3 Encounters:  08/03/18 144 lb 12.8 oz (65.7 kg)  03/22/18 141 lb 12.8 oz (64.3 kg)  03/22/17 142 lb 6.4 oz (64.6 kg)     GEN:  Well nourished, well developed in no acute distress HEENT: Normal NECK: No JVD; No carotid bruits LYMPHATICS: No  lymphadenopathy CARDIAC: RRR, systolic murmur best heard left border of the sternum 3/6, no rubs, no gallops RESPIRATORY:  Clear to auscultation without rales, wheezing or rhonchi  ABDOMEN: Soft, non-tender, non-distended MUSCULOSKELETAL:  No edema; No deformity  SKIN: Warm and dry LOWER EXTREMITIES: no swelling NEUROLOGIC:  Alert and oriented x 3 PSYCHIATRIC:  Normal affect   ASSESSMENT:    1. Ischemic cardiomyopathy   2. Chronic combined systolic and diastolic congestive heart failure ([place])   3. Essential hypertension   4. Mixed hyperlipidemia    PLAN:    In order of problems listed above:  1. Ischemic cardiomyopathy on appropriate medication that he can tolerate which include beta-blocker as well as BiDil.  We will continue that management. 2. Chronic kidney failure he is scheduled to see a nephrologist Thursday. 3. Essential hypertension blood pressure well controlled continue present management. 4. Dyslipidemia we will schedule him to have fasting lipid profile done he goes to his nephrologist tomorrow I will asked him to take blood for cholesterol.   Medication Adjustments/Labs and Tests Ordered: Current medicines are reviewed at length with the patient today.  Concerns regarding medicines are outlined above.  No orders of the defined types were placed in this encounter.  Medication changes: No orders of the defined types were placed in this encounter.   Signed, Park Liter, MD, [facility] 08/03/2018 2:06 PM    [place]

## 2018-08-03 NOTE — Patient Instructions (Signed)
Medication Instructions:  Your physician recommends that you continue on your current medications as directed. Please refer to the Current Medication list given to you today.  If you need a refill on your cardiac medications before your next appointment, please call your pharmacy.   Lab work: None ordered If you have labs (blood work) drawn today and your tests are completely normal, you will receive your results only by: Marland Kitchen MyChart Message (if you have MyChart) OR . A paper copy in the mail If you have any lab test that is abnormal or we need to change your treatment, we will call you to review the results.  Testing/Procedures: None ordered  Follow-Up: At [facility], you and your health needs are our priority.  As part of our continuing mission to provide you with exceptional heart care, we have created designated Provider Care Teams.  These Care Teams include your primary Cardiologist (physician) and Advanced Practice Providers (APPs -  Physician Assistants and Nurse Practitioners) who all work together to provide you with the care you need, when you need it. You will need a follow up appointment in 6 months.  Please call our office 2 months in advance to schedule this appointment.  You may see  Jenne Campus or another member of our [employer] Provider Team in [place]: Shirlee More, MD . Jyl Heinz, MD  Any Other Special Instructions Will Be Listed Below (If Applicable).

## 2018-08-04 DIAGNOSIS — I120 Hypertensive chronic kidney disease with stage 5 chronic kidney disease or end stage renal disease: Secondary | ICD-10-CM | POA: Diagnosis not present

## 2018-08-04 DIAGNOSIS — I350 Nonrheumatic aortic (valve) stenosis: Secondary | ICD-10-CM | POA: Diagnosis not present

## 2018-08-04 DIAGNOSIS — N189 Chronic kidney disease, unspecified: Secondary | ICD-10-CM | POA: Diagnosis not present

## 2018-08-04 DIAGNOSIS — N2581 Secondary hyperparathyroidism of renal origin: Secondary | ICD-10-CM | POA: Diagnosis not present

## 2018-08-04 DIAGNOSIS — N185 Chronic kidney disease, stage 5: Secondary | ICD-10-CM | POA: Diagnosis not present

## 2018-08-04 DIAGNOSIS — E785 Hyperlipidemia, unspecified: Secondary | ICD-10-CM | POA: Diagnosis not present

## 2018-08-04 DIAGNOSIS — I214 Non-ST elevation (NSTEMI) myocardial infarction: Secondary | ICD-10-CM | POA: Diagnosis not present

## 2018-08-04 DIAGNOSIS — D631 Anemia in chronic kidney disease: Secondary | ICD-10-CM | POA: Diagnosis not present

## 2018-10-04 DIAGNOSIS — E785 Hyperlipidemia, unspecified: Secondary | ICD-10-CM | POA: Diagnosis not present

## 2018-10-04 DIAGNOSIS — N2581 Secondary hyperparathyroidism of renal origin: Secondary | ICD-10-CM | POA: Diagnosis not present

## 2018-10-04 DIAGNOSIS — N189 Chronic kidney disease, unspecified: Secondary | ICD-10-CM | POA: Diagnosis not present

## 2018-10-04 DIAGNOSIS — I350 Nonrheumatic aortic (valve) stenosis: Secondary | ICD-10-CM | POA: Diagnosis not present

## 2018-10-04 DIAGNOSIS — I120 Hypertensive chronic kidney disease with stage 5 chronic kidney disease or end stage renal disease: Secondary | ICD-10-CM | POA: Diagnosis not present

## 2018-10-04 DIAGNOSIS — N185 Chronic kidney disease, stage 5: Secondary | ICD-10-CM | POA: Diagnosis not present

## 2018-10-04 DIAGNOSIS — D631 Anemia in chronic kidney disease: Secondary | ICD-10-CM | POA: Diagnosis not present

## 2018-10-04 DIAGNOSIS — M109 Gout, unspecified: Secondary | ICD-10-CM | POA: Diagnosis not present

## 2018-10-04 DIAGNOSIS — I214 Non-ST elevation (NSTEMI) myocardial infarction: Secondary | ICD-10-CM | POA: Diagnosis not present

## 2018-12-09 ENCOUNTER — Other Ambulatory Visit: Payer: Self-pay | Admitting: Cardiology

## 2018-12-09 NOTE — Telephone Encounter (Signed)
Rx refill sent to pharmacy. 

## 2019-01-19 ENCOUNTER — Telehealth: Payer: Self-pay | Admitting: Cardiology

## 2019-01-19 NOTE — Telephone Encounter (Signed)
Virtual Visit Pre-Appointment Phone Call  "(Name), I am calling you today to discuss your upcoming appointment. We are currently trying to limit exposure to the virus that causes COVID-19 by seeing patients at home rather than in the office."  1. "What is the BEST phone number to call the day of the visit?" - include this in appointment notes  2. Do you have or have access to (through a family member/friend) a smartphone with video capability that we can use for your visit?" a. If yes - list this number in appt notes as cell (if different from BEST phone #) and list the appointment type as a VIDEO visit in appointment notes b. If no - list the appointment type as a PHONE visit in appointment notes  3. Confirm consent - "In the setting of the current Covid19 crisis, you are scheduled for a (phone or video) visit with your provider on (date) at (time).  Just as we do with many in-office visits, in order for you to participate in this visit, we must obtain consent.  If you'd like, I can send this to your mychart (if signed up) or email for you to review.  Otherwise, I can obtain your verbal consent now.  All virtual visits are billed to your insurance company just like a normal visit would be.  By agreeing to a virtual visit, we'd like you to understand that the technology does not allow for your provider to perform an examination, and thus may limit your provider's ability to fully assess your condition. If your provider identifies any concerns that need to be evaluated in person, we will make arrangements to do so.  Finally, though the technology is pretty good, we cannot assure that it will always work on either your or our end, and in the setting of a video visit, we may have to convert it to a phone-only visit.  In either situation, we cannot ensure that we have a secure connection.  Are you willing to proceed?" STAFF: Did the patient verbally acknowledge consent to telehealth visit? Document  YES/NO here: YES  4. Advise patient to be prepared - "Two hours prior to your appointment, go ahead and check your blood pressure, pulse, oxygen saturation, and your weight (if you have the equipment to check those) and write them all down. When your visit starts, your provider will ask you for this information. If you have an Apple Watch or Kardia device, please plan to have heart rate information ready on the day of your appointment. Please have a pen and paper handy nearby the day of the visit as well."  5. Give patient instructions for MyChart download to smartphone OR Doximity/Doxy.me as below if video visit (depending on what platform provider is using)  6. Inform patient they will receive a phone call 15 minutes prior to their appointment time (may be from unknown caller ID) so they should be prepared to answer    Tanner Pearson has been deemed a candidate for a follow-up tele-health visit to limit community exposure during the Covid-19 pandemic. I spoke with the patient via phone to ensure availability of phone/video source, confirm preferred email & phone number, and discuss instructions and expectations.  I reminded Tanner Pearson to be prepared with any vital sign and/or heart rhythm information that could potentially be obtained via home monitoring, at the time of his visit. I reminded Tanner Pearson to expect a phone call prior to  his visit.  Tanner Pearson 01/19/2019 9:28 AM   INSTRUCTIONS FOR DOWNLOADING THE MYCHART APP TO SMARTPHONE  - The patient must first make sure to have activated MyChart and know their login information - If Apple, go to [employer] and type in MyChart in the search bar and download the app. If Android, ask patient to go to [employer] and type in [place] in the search bar and download the app. The app is free but as with any other app downloads, their phone may require them to verify saved payment information or Apple/Android  password.  - The patient will need to then log into the app with their MyChart username and password, and select [facility] as their healthcare provider to link the account. When it is time for your visit, go to the MyChart app, find appointments, and click Begin Video Visit. Be sure to Select Allow for your device to access the Microphone and Camera for your visit. You will then be connected, and your provider will be with you shortly.  **If they have any issues connecting, or need assistance please contact MyChart service desk [phone])**  **If using a computer, in order to ensure the best quality for their visit they will need to use either of the following Internet Browsers: [employer], or Google Chrome**  IF USING DOXIMITY or DOXY.ME - The patient will receive a link just prior to their visit by text.     FULL LENGTH CONSENT FOR TELE-HEALTH VISIT   I hereby voluntarily request, consent and authorize [facility] and its employed or contracted physicians, physician assistants, nurse practitioners or other licensed health care professionals (the Practitioner), to provide me with telemedicine health care services (the Services") as deemed necessary by the treating Practitioner. I acknowledge and consent to receive the Services by the Practitioner via telemedicine. I understand that the telemedicine visit will involve communicating with the Practitioner through live audiovisual communication technology and the disclosure of certain medical information by electronic transmission. I acknowledge that I have been given the opportunity to request an in-person assessment or other available alternative prior to the telemedicine visit and am voluntarily participating in the telemedicine visit.  I understand that I have the right to withhold or withdraw my consent to the use of telemedicine in the course of my care at any time, without affecting my right to future care or treatment,  and that the Practitioner or I may terminate the telemedicine visit at any time. I understand that I have the right to inspect all information obtained and/or recorded in the course of the telemedicine visit and may receive copies of available information for a reasonable fee.  I understand that some of the potential risks of receiving the Services via telemedicine include:   Delay or interruption in medical evaluation due to technological equipment failure or disruption;  Information transmitted may not be sufficient (e.g. poor resolution of images) to allow for appropriate medical decision making by the Practitioner; and/or   In rare instances, security protocols could fail, causing a breach of personal health information.  Furthermore, I acknowledge that it is my responsibility to provide information about my medical history, conditions and care that is complete and accurate to the best of my ability. I acknowledge that Practitioner's advice, recommendations, and/or decision may be based on factors not within their control, such as incomplete or inaccurate data provided by me or distortions of diagnostic images or specimens that may result from electronic transmissions. I  understand that the practice of medicine is not an exact science and that Practitioner makes no warranties or guarantees regarding treatment outcomes. I acknowledge that I will receive a copy of this consent concurrently upon execution via email to the email address I last provided but may also request a printed copy by calling the office of [facility].    I understand that my insurance will be billed for this visit.   I have read or had this consent read to me.  I understand the contents of this consent, which adequately explains the benefits and risks of the Services being provided via telemedicine.   I have been provided ample opportunity to ask questions regarding this consent and the Services and have had my questions  answered to my satisfaction.  I give my informed consent for the services to be provided through the use of telemedicine in my medical care  By participating in this telemedicine visit I agree to the above.

## 2019-01-26 ENCOUNTER — Telehealth: Payer: Medicare Other | Admitting: Cardiology

## 2019-01-26 ENCOUNTER — Other Ambulatory Visit: Payer: Self-pay

## 2019-02-02 ENCOUNTER — Encounter: Payer: Self-pay | Admitting: Cardiology

## 2019-02-02 ENCOUNTER — Other Ambulatory Visit: Payer: Self-pay

## 2019-02-02 ENCOUNTER — Telehealth (INDEPENDENT_AMBULATORY_CARE_PROVIDER_SITE_OTHER): Payer: Medicare Other | Admitting: Cardiology

## 2019-02-02 VITALS — BP 133/71 | Wt 140.0 lb

## 2019-02-02 DIAGNOSIS — E782 Mixed hyperlipidemia: Secondary | ICD-10-CM

## 2019-02-02 DIAGNOSIS — I10 Essential (primary) hypertension: Secondary | ICD-10-CM

## 2019-02-02 DIAGNOSIS — I255 Ischemic cardiomyopathy: Secondary | ICD-10-CM | POA: Diagnosis not present

## 2019-02-02 DIAGNOSIS — R0602 Shortness of breath: Secondary | ICD-10-CM

## 2019-02-02 MED ORDER — PRAVASTATIN SODIUM 40 MG PO TABS: 40.0000 mg | ORAL_TABLET | Freq: Every day | ORAL | 1 refills | Status: DC

## 2019-02-02 MED ORDER — BIDIL 20-37.5 MG PO TABS: 1.0000 | ORAL_TABLET | Freq: Three times a day (TID) | ORAL | 1 refills | Status: DC

## 2019-02-02 MED ORDER — CARVEDILOL 25 MG PO TABS: 25.0000 mg | ORAL_TABLET | Freq: Two times a day (BID) | ORAL | 0 refills | Status: DC

## 2019-02-02 NOTE — Progress Notes (Signed)
Virtual Visit via Telephone Note   This visit type was conducted due to national recommendations for restrictions regarding the COVID-19 Pandemic (e.g. social distancing) in an effort to limit this patient's exposure and mitigate transmission in our community.  Due to his co-morbid illnesses, this patient is at least at moderate risk for complications without adequate follow up.  This format is felt to be most appropriate for this patient at this time.  The patient did not have access to video technology/had technical difficulties with video requiring transitioning to audio format only (telephone).  All issues noted in this document were discussed and addressed.  No physical exam could be performed with this format.  Please refer to the patient's chart for his  consent to telehealth for [facility].  Evaluation Performed:  Follow-up visit  This visit type was conducted due to national recommendations for restrictions regarding the COVID-19 Pandemic (e.g. social distancing).  This format is felt to be most appropriate for this patient at this time.  All issues noted in this document were discussed and addressed.  No physical exam was performed (except for noted visual exam findings with Video Visits).  Please refer to the patient's chart (MyChart message for video visits and phone note for telephone visits) for the patient's consent to telehealth for [facility].  Date:  02/02/2019  ID: Tanner Pearson, DOB 11/03/1928, MRN [id number]   Patient Location: [place] [address]   Provider location:   [place] Office  PCP:  Ernestene Kiel, MD  Cardiologist:  Jenne Campus, MD     Chief Complaint: Doing well  History of Present Illness:    Tanner Pearson is a 85 y.o. male  who presents via audio/video conferencing for a telehealth visit today.  With ischemic cardiomyopathy ejection fraction 40%, chronic kidney failure, dyslipidemia, hypertension.  He  does have a virtual visit with me today.  Seems to be doing well denies have any chest pain tightness squeezing pressure burning chest he said he is doing well he is keeping precaution against coronavirus infection.   The patient does not have symptoms concerning for COVID-19 infection (fever, chills, cough, or new SHORTNESS OF BREATH).    Prior CV studies:   The following studies were reviewed today:       Past Medical History:  Diagnosis Date   Acute on chronic combined systolic (congestive) and diastolic (congestive) heart failure (HCC)    CAD (coronary artery disease)    Chronic kidney disease (CKD), stage IV (severe) (HCC)    Dyslipidemia    Hyperlipidemia    Hypertension     Past Surgical History:  Procedure Laterality Date   AV FISTULA PLACEMENT, RADIOCEPHALIC     CATARACT EXTRACTION       Current Meds  Medication Sig   allopurinol (ZYLOPRIM) 100 MG tablet Take 100 mg by mouth daily.   aspirin EC 81 MG tablet Take 81 mg by mouth daily.   BIDIL 20-37.5 MG tablet TAKE 1 TABLET THREE TIMES A DAY   carvedilol (COREG) 25 MG tablet TAKE 1 TABLET BY MOUTH TWICE DAILY   pravastatin (PRAVACHOL) 40 MG tablet Take 1 tablet (40 mg total) by mouth at bedtime.      Family History: The patient's family history includes Hypertension in his mother; Polycystic kidney disease in his brother and sister; Stroke in his mother.   ROS:   Please see the history of present illness.     All other  systems reviewed and are negative.   Labs/Other Tests and Data Reviewed:     Recent Labs: No results found for requested labs within last 8760 hours.  Recent Lipid Panel No results found for: CHOL, TRIG, HDL, CHOLHDL, VLDL, LDLCALC, LDLDIRECT    Exam:    Vital Signs:  BP 133/71    Wt 140 lb (63.5 kg)    BMI 20.67 kg/m     Wt Readings from Last 3 Encounters:  02/02/19 140 lb (63.5 kg)  08/03/18 144 lb 12.8 oz (65.7 kg)  03/22/18 141 lb 12.8 oz (64.3 kg)      Well nourished, well developed in no acute distress. Alert awake and attentive 3 happy to be able to talk to me without any distress  Diagnosis for this visit:   1. Ischemic cardiomyopathy   2. Essential hypertension   3. Mixed hyperlipidemia   4. Shortness of breath      ASSESSMENT & PLAN:    1.  Ischemic cardiomyopathy on appropriate medications that he can tolerate.  Because of kidney dysfunction he is not on ACE inhibitor or ARB or Entresto.  We will schedule him to have an echocardiogram done. 2.  Essential hypertension stable we will continue present management 3.  Mixed dyslipidemia he is getting ready to go to a nephrologist I asked to have lipid panel checked. 4.  Shortness of breath denies having any  COVID-19 Education: The signs and symptoms of COVID-19 were discussed with the patient and how to seek care for testing (follow up with PCP or arrange E-visit).  The importance of social distancing was discussed today.  Patient Risk:   After full review of this patients clinical status, I feel that they are at least moderate risk at this time.  Time:   Today, I have spent 15 minutes with the patient with telehealth technology discussing pt health issues.  I spent 5 minutes reviewing her chart before the visit.  Visit was finished at 3:28 PM.    Medication Adjustments/Labs and Tests Ordered: Current medicines are reviewed at length with the patient today.  Concerns regarding medicines are outlined above.  No orders of the defined types were placed in this encounter.  Medication changes: No orders of the defined types were placed in this encounter.    Disposition: Follow-up in 5 months  Signed, Park Liter, MD, [facility] 02/02/2019 3:27 PM    [facility]

## 2019-02-02 NOTE — Addendum Note (Signed)
Addended by: Ashok Norris on: 02/02/2019 03:34 PM   Modules accepted: Orders

## 2019-02-02 NOTE — Patient Instructions (Signed)
Medication Instructions:  Your physician recommends that you continue on your current medications as directed. Please refer to the Current Medication list given to you today.  If you need a refill on your cardiac medications before your next appointment, please call your pharmacy.   Lab work: None.  If you have labs (blood work) drawn today and your tests are completely normal, you will receive your results only by: . MyChart Message (if you have MyChart) OR . A paper copy in the mail If you have any lab test that is abnormal or we need to change your treatment, we will call you to review the results.  Testing/Procedures: Your physician has requested that you have an echocardiogram. Echocardiography is a painless test that uses sound waves to create images of your heart. It provides your doctor with information about the size and shape of your heart and how well your heart's chambers and valves are working. This procedure takes approximately one hour. There are no restrictions for this procedure.    Follow-Up: At [facility], you and your health needs are our priority.  As part of our continuing mission to provide you with exceptional heart care, we have created designated Provider Care Teams.  These Care Teams include your primary Cardiologist (physician) and Advanced Practice Providers (APPs -  Physician Assistants and Nurse Practitioners) who all work together to provide you with the care you need, when you need it. You will need a follow up appointment in 1 months.  Please call our office 2 months in advance to schedule this appointment.  You may see No primary care provider on file. or another member of our [facility] Provider Team in [place]: Brian Munley, MD . Rajan Revankar, MD  Any Other Special Instructions Will Be Listed Below (If Applicable).   Echocardiogram An echocardiogram is a procedure that uses painless sound waves (ultrasound) to produce an image of the heart.  Images from an echocardiogram can provide important information about:  Signs of coronary artery disease (CAD).  Aneurysm detection. An aneurysm is a weak or damaged part of an artery wall that bulges out from the normal force of blood pumping through the body.  Heart size and shape. Changes in the size or shape of the heart can be associated with certain conditions, including heart failure, aneurysm, and CAD.  Heart muscle function.  Heart valve function.  Signs of a past heart attack.  Fluid buildup around the heart.  Thickening of the heart muscle.  A tumor or infectious growth around the heart valves. Tell a health care provider about:  Any allergies you have.  All medicines you are taking, including vitamins, herbs, eye drops, creams, and over-the-counter medicines.  Any blood disorders you have.  Any surgeries you have had.  Any medical conditions you have.  Whether you are pregnant or may be pregnant. What are the risks? Generally, this is a safe procedure. However, problems may occur, including:  Allergic reaction to dye (contrast) that may be used during the procedure. What happens before the procedure? No specific preparation is needed. You may eat and drink normally. What happens during the procedure?   An IV tube may be inserted into one of your veins.  You may receive contrast through this tube. A contrast is an injection that improves the quality of the pictures from your heart.  A gel will be applied to your chest.  A wand-like tool (transducer) will be moved over your chest. The gel will help to   transmit the sound waves from the transducer.  The sound waves will harmlessly bounce off of your heart to allow the heart images to be captured in real-time motion. The images will be recorded on a computer. The procedure may vary among health care providers and hospitals. What happens after the procedure?  You may return to your normal, everyday life,  including diet, activities, and medicines, unless your health care provider tells you not to do that. Summary  An echocardiogram is a procedure that uses painless sound waves (ultrasound) to produce an image of the heart.  Images from an echocardiogram can provide important information about the size and shape of your heart, heart muscle function, heart valve function, and fluid buildup around your heart.  You do not need to do anything to prepare before this procedure. You may eat and drink normally.  After the echocardiogram is completed, you may return to your normal, everyday life, unless your health care provider tells you not to do that. This information is not intended to replace advice given to you by your health care provider. Make sure you discuss any questions you have with your health care provider. Document Released: 07/31/2000 Document Revised: 09/05/2016 Document Reviewed: 09/05/2016 Elsevier Interactive Patient Education  2019 [employer] Inc[employer]    

## 2019-03-16 ENCOUNTER — Other Ambulatory Visit: Payer: Medicare Other

## 2019-05-04 ENCOUNTER — Other Ambulatory Visit: Payer: Medicare Other

## 2019-07-07 ENCOUNTER — Other Ambulatory Visit: Payer: Self-pay

## 2019-07-10 ENCOUNTER — Other Ambulatory Visit: Payer: Self-pay

## 2019-07-10 ENCOUNTER — Encounter: Payer: Self-pay | Admitting: Cardiology

## 2019-07-10 ENCOUNTER — Telehealth (INDEPENDENT_AMBULATORY_CARE_PROVIDER_SITE_OTHER): Payer: Medicare Other | Admitting: Cardiology

## 2019-07-10 VITALS — BP 121/62 | HR 68 | Wt 140.2 lb

## 2019-07-10 DIAGNOSIS — I2510 Atherosclerotic heart disease of native coronary artery without angina pectoris: Secondary | ICD-10-CM

## 2019-07-10 DIAGNOSIS — I10 Essential (primary) hypertension: Secondary | ICD-10-CM

## 2019-07-10 DIAGNOSIS — I5042 Chronic combined systolic (congestive) and diastolic (congestive) heart failure: Secondary | ICD-10-CM

## 2019-07-10 DIAGNOSIS — I255 Ischemic cardiomyopathy: Secondary | ICD-10-CM

## 2019-07-10 DIAGNOSIS — E782 Mixed hyperlipidemia: Secondary | ICD-10-CM

## 2019-07-10 NOTE — Addendum Note (Signed)
Addended by: Ashok Norris on: 07/10/2019 04:05 PM   Modules accepted: Orders

## 2019-07-10 NOTE — Patient Instructions (Signed)
Medication Instructions:  Your physician recommends that you continue on your current medications as directed. Please refer to the Current Medication list given to you today.  *If you need a refill on your cardiac medications before your next appointment, please call your pharmacy*  Lab Work: Your physician recommends that you return for lab work when you come for echo: bmp    If you have labs (blood work) drawn today and your tests are completely normal, you will receive your results only by: Marland Kitchen MyChart Message (if you have MyChart) OR . A paper copy in the mail If you have any lab test that is abnormal or we need to change your treatment, we will call you to review the results.  Testing/Procedures: Your physician has requested that you have an echocardiogram. Echocardiography is a painless test that uses sound waves to create images of your heart. It provides your doctor with information about the size and shape of your heart and how well your heart's chambers and valves are working. This procedure takes approximately one hour. There are no restrictions for this procedure.    Follow-Up: At [facility], you and your health needs are our priority.  As part of our continuing mission to provide you with exceptional heart care, we have created designated Provider Care Teams.  These Care Teams include your primary Cardiologist (physician) and Advanced Practice Providers (APPs -  Physician Assistants and Nurse Practitioners) who all work together to provide you with the care you need, when you need it.  Your next appointment:   4 month(s)  The format for your next appointment:   In Person  Provider:   Jenne Campus, MD  Other Instructions   Echocardiogram An echocardiogram is a procedure that uses painless sound waves (ultrasound) to produce an image of the heart. Images from an echocardiogram can provide important information about:  Signs of coronary artery disease (CAD).   Aneurysm detection. An aneurysm is a weak or damaged part of an artery wall that bulges out from the normal force of blood pumping through the body.  Heart size and shape. Changes in the size or shape of the heart can be associated with certain conditions, including heart failure, aneurysm, and CAD.  Heart muscle function.  Heart valve function.  Signs of a past heart attack.  Fluid buildup around the heart.  Thickening of the heart muscle.  A tumor or infectious growth around the heart valves. Tell a health care provider about:  Any allergies you have.  All medicines you are taking, including vitamins, herbs, eye drops, creams, and over-the-counter medicines.  Any blood disorders you have.  Any surgeries you have had.  Any medical conditions you have.  Whether you are pregnant or may be pregnant. What are the risks? Generally, this is a safe procedure. However, problems may occur, including:  Allergic reaction to dye (contrast) that may be used during the procedure. What happens before the procedure? No specific preparation is needed. You may eat and drink normally. What happens during the procedure?   An IV tube may be inserted into one of your veins.  You may receive contrast through this tube. A contrast is an injection that improves the quality of the pictures from your heart.  A gel will be applied to your chest.  A wand-like tool (transducer) will be moved over your chest. The gel will help to transmit the sound waves from the transducer.  The sound waves will harmlessly bounce off of your heart  to allow the heart images to be captured in real-time motion. The images will be recorded on a computer. The procedure may vary among health care providers and hospitals. What happens after the procedure?  You may return to your normal, everyday life, including diet, activities, and medicines, unless your health care provider tells you not to do that. Summary  An  echocardiogram is a procedure that uses painless sound waves (ultrasound) to produce an image of the heart.  Images from an echocardiogram can provide important information about the size and shape of your heart, heart muscle function, heart valve function, and fluid buildup around your heart.  You do not need to do anything to prepare before this procedure. You may eat and drink normally.  After the echocardiogram is completed, you may return to your normal, everyday life, unless your health care provider tells you not to do that. This information is not intended to replace advice given to you by your health care provider. Make sure you discuss any questions you have with your health care provider. Document Released: 07/31/2000 Document Revised: 11/24/2018 Document Reviewed: 09/05/2016 Elsevier Patient Education  2020 [employer].

## 2019-07-10 NOTE — Progress Notes (Signed)
Virtual Visit via Telephone Note   This visit type was conducted due to national recommendations for restrictions regarding the COVID-19 Pandemic (e.g. social distancing) in an effort to limit this patient's exposure and mitigate transmission in our community.  Due to his co-morbid illnesses, this patient is at least at moderate risk for complications without adequate follow up.  This format is felt to be most appropriate for this patient at this time.  The patient did not have access to video technology/had technical difficulties with video requiring transitioning to audio format only (telephone).  All issues noted in this document were discussed and addressed.  No physical exam could be performed with this format.  Please refer to the patient's chart for his  consent to telehealth for [facility].  Evaluation Performed:  Follow-up visit  This visit type was conducted due to national recommendations for restrictions regarding the COVID-19 Pandemic (e.g. social distancing).  This format is felt to be most appropriate for this patient at this time.  All issues noted in this document were discussed and addressed.  No physical exam was performed (except for noted visual exam findings with Video Visits).  Please refer to the patient's chart (MyChart message for video visits and phone note for telephone visits) for the patient's consent to telehealth for [facility].  Date:  07/10/2019  ID: Tanner Pearson, DOB 01/23/1929, MRN [id number]   Patient Location: [name] [address]   Provider location:   [place] Office  PCP:  Tanner Kiel, MD  Cardiologist:  Jenne Campus, MD     Chief Complaint: Doing well  History of Present Illness:    Tanner Pearson is a 85 y.o. male  who presents via audio/video conferencing for a telehealth visit today.  With coronary artery disease, status post STEMI years ago, ischemic cardiomyopathy with ejection fraction of  40%, chronic kidney failure, dyslipidemia' overall she seems to be doing well.  He spent time sitting in the chair watching TV.  He does not go outside being scared of coronavirus.  Denies having any chest pain, tightness, pressure, burning in the chest no swelling of lower extremities just shortness of breath with exertion   The patient does not have symptoms concerning for COVID-19 infection (fever, chills, cough, or new SHORTNESS OF BREATH).    Prior CV studies:   The following studies were reviewed today:       Past Medical History:  Diagnosis Date  . Acute on chronic combined systolic (congestive) and diastolic (congestive) heart failure ([place])   . CAD (coronary artery disease)   . Chronic kidney disease (CKD), stage IV (severe) ([place])   . Dyslipidemia   . Hyperlipidemia   . Hypertension     Past Surgical History:  Procedure Laterality Date  . AV FISTULA PLACEMENT, RADIOCEPHALIC    . CATARACT EXTRACTION       Current Meds  Medication Sig  . allopurinol (ZYLOPRIM) 100 MG tablet Take 100 mg by mouth daily.  Marland Kitchen aspirin EC 81 MG tablet Take 81 mg by mouth daily.  . carvedilol (COREG) 25 MG tablet Take 1 tablet (25 mg total) by mouth 2 (two) times daily.  . isosorbide-hydrALAZINE (BIDIL) 20-37.5 MG tablet Take 1 tablet by mouth 3 (three) times daily.  . pravastatin (PRAVACHOL) 40 MG tablet Take 1 tablet (40 mg total) by mouth at bedtime.      Family History: The patient's family history includes Hypertension in his mother; Polycystic kidney  disease in his brother and sister; Stroke in his mother.   ROS:   Please see the history of present illness.     All other systems reviewed and are negative.   Labs/Other Tests and Data Reviewed:     Recent Labs: No results found for requested labs within last 8760 hours.  Recent Lipid Panel No results found for: CHOL, TRIG, HDL, CHOLHDL, VLDL, LDLCALC, LDLDIRECT    Exam:    Vital Signs:  BP 121/62   Pulse 68   Wt 140  lb 3.2 oz (63.6 kg)   BMI 20.70 kg/m     Wt Readings from Last 3 Encounters:  07/10/19 140 lb 3.2 oz (63.6 kg)  02/02/19 140 lb (63.5 kg)  08/03/18 144 lb 12.8 oz (65.7 kg)     Well nourished, well developed in no acute distress. Alert awake and attentive he sitting at home and talking over the phone he is unable to establish video link.  I am in my office in [place].  He is not in any distress actually quite cheerful and happy to be able to talk to me  Diagnosis for this visit:   1. Chronic combined systolic and diastolic congestive heart failure ([place])   2. Essential hypertension   3. Coronary artery disease involving native coronary artery of native heart without angina pectoris   4. Ischemic cardiomyopathy   5. Mixed hyperlipidemia      ASSESSMENT & PLAN:    1.  Chronic systolic and diastolic congestive heart failure appears to be compensated from the conversation I get I will schedule him to have echocardiogram done to assess left ventricle ejection fraction. Essential hypertension blood pressure well controlled we will continue present management. 3.  Coronary disease stable no recent problems. 4.  Ischemic cardiomyopathy on appropriate medications the limiting factor is his kidney function will recheck his Chem-7 when he comes for echocardiogram. 5.  Dyslipidemia we will check his fasting blood profile when he be here next time  COVID-19 Education: The signs and symptoms of COVID-19 were discussed with the patient and how to seek care for testing (follow up with PCP or arrange E-visit).  The importance of social distancing was discussed today.  Patient Risk:   After full review of this patients clinical status, I feel that they are at least moderate risk at this time.  Time:   Today, I have spent 5 minutes with the patient with telehealth technology discussing pt health issues.  I spent 15 minutes reviewing her chart before the visit.  Visit was finished at 3:38 PM.     Medication Adjustments/Labs and Tests Ordered: Current medicines are reviewed at length with the patient today.  Concerns regarding medicines are outlined above.  No orders of the defined types were placed in this encounter.  Medication changes: No orders of the defined types were placed in this encounter.    Disposition: Follow-up in 5 months  Signed, Park Liter, MD, [facility] 07/10/2019 3:35 PM    [place]

## 2019-09-05 ENCOUNTER — Other Ambulatory Visit: Payer: Self-pay | Admitting: Cardiology

## 2019-09-05 MED ORDER — PRAVASTATIN SODIUM 40 MG PO TABS: 40.0000 mg | ORAL_TABLET | Freq: Every day | ORAL | 0 refills | Status: DC

## 2019-09-05 MED ORDER — BIDIL 20-37.5 MG PO TABS: 1.0000 | ORAL_TABLET | Freq: Three times a day (TID) | ORAL | 0 refills | Status: AC

## 2019-09-05 MED ORDER — CARVEDILOL 25 MG PO TABS: 25.0000 mg | ORAL_TABLET | Freq: Two times a day (BID) | ORAL | 0 refills | Status: DC

## 2019-09-05 NOTE — Telephone Encounter (Signed)
 *  STAT* If patient is at the pharmacy, call can be transferred to refill team.   1. Which medications need to be refilled? (please list name of each medication and dose if known)  carvedilol (COREG) 25 MG tablet isosorbide-hydrALAZINE (BIDIL) 20-37.5 MG tablet pravastatin (PRAVACHOL) 40 MG tablet   2. Which pharmacy/location (including street and city if local pharmacy) is medication to be sent to? [facility] [place], [place]  3. Do they need a 30 day or 90 day supply? 90 days  Patient is almost out of medication

## 2019-09-05 NOTE — Telephone Encounter (Signed)
Medications refilled

## 2019-09-06 ENCOUNTER — Other Ambulatory Visit: Payer: Medicare Other

## 2019-09-15 DIAGNOSIS — R110 Nausea: Secondary | ICD-10-CM | POA: Diagnosis not present

## 2019-09-15 DIAGNOSIS — R6520 Severe sepsis without septic shock: Secondary | ICD-10-CM | POA: Diagnosis not present

## 2019-09-15 DIAGNOSIS — I509 Heart failure, unspecified: Secondary | ICD-10-CM | POA: Diagnosis not present

## 2019-09-15 DIAGNOSIS — A419 Sepsis, unspecified organism: Secondary | ICD-10-CM | POA: Diagnosis not present

## 2019-09-15 DIAGNOSIS — I361 Nonrheumatic tricuspid (valve) insufficiency: Secondary | ICD-10-CM | POA: Diagnosis not present

## 2019-09-15 DIAGNOSIS — I130 Hypertensive heart and chronic kidney disease with heart failure and stage 1 through stage 4 chronic kidney disease, or unspecified chronic kidney disease: Secondary | ICD-10-CM | POA: Diagnosis not present

## 2019-09-15 DIAGNOSIS — N179 Acute kidney failure, unspecified: Secondary | ICD-10-CM | POA: Diagnosis not present

## 2019-09-15 DIAGNOSIS — N184 Chronic kidney disease, stage 4 (severe): Secondary | ICD-10-CM | POA: Diagnosis not present

## 2019-09-15 DIAGNOSIS — R0602 Shortness of breath: Secondary | ICD-10-CM | POA: Diagnosis not present

## 2019-09-15 DIAGNOSIS — R0600 Dyspnea, unspecified: Secondary | ICD-10-CM | POA: Diagnosis not present

## 2019-09-15 DIAGNOSIS — E872 Acidosis: Secondary | ICD-10-CM | POA: Diagnosis not present

## 2019-09-15 DIAGNOSIS — I110 Hypertensive heart disease with heart failure: Secondary | ICD-10-CM | POA: Diagnosis not present

## 2019-09-15 DIAGNOSIS — I352 Nonrheumatic aortic (valve) stenosis with insufficiency: Secondary | ICD-10-CM | POA: Diagnosis not present

## 2019-09-15 DIAGNOSIS — J90 Pleural effusion, not elsewhere classified: Secondary | ICD-10-CM | POA: Diagnosis not present

## 2019-09-15 DIAGNOSIS — E860 Dehydration: Secondary | ICD-10-CM | POA: Diagnosis not present

## 2019-09-15 DIAGNOSIS — I447 Left bundle-branch block, unspecified: Secondary | ICD-10-CM | POA: Diagnosis not present

## 2019-09-15 DIAGNOSIS — I214 Non-ST elevation (NSTEMI) myocardial infarction: Secondary | ICD-10-CM | POA: Diagnosis not present

## 2019-09-15 DIAGNOSIS — I340 Nonrheumatic mitral (valve) insufficiency: Secondary | ICD-10-CM | POA: Diagnosis not present

## 2019-09-15 DIAGNOSIS — J189 Pneumonia, unspecified organism: Secondary | ICD-10-CM | POA: Diagnosis not present

## 2019-09-15 DIAGNOSIS — U071 COVID-19: Secondary | ICD-10-CM | POA: Diagnosis not present

## 2019-09-16 ENCOUNTER — Other Ambulatory Visit (HOSPITAL_COMMUNITY): Payer: Medicare Other

## 2019-09-16 ENCOUNTER — Inpatient Hospital Stay (HOSPITAL_COMMUNITY): Payer: Medicare Other

## 2019-09-16 ENCOUNTER — Encounter (HOSPITAL_COMMUNITY): Payer: Self-pay | Admitting: Internal Medicine

## 2019-09-16 ENCOUNTER — Other Ambulatory Visit: Payer: Self-pay

## 2019-09-16 ENCOUNTER — Inpatient Hospital Stay (HOSPITAL_COMMUNITY)
Admission: AD | Admit: 2019-09-16 | Discharge: 2019-09-22 | DRG: 280 | Disposition: A | Discharge status: Home Health | Payer: Medicare Other | Source: Other Acute Inpatient Hospital | Attending: Internal Medicine | Admitting: Internal Medicine

## 2019-09-16 DIAGNOSIS — Z8271 Family history of polycystic kidney: Secondary | ICD-10-CM

## 2019-09-16 DIAGNOSIS — Z681 Body mass index (BMI) 19 or less, adult: Secondary | ICD-10-CM | POA: Diagnosis not present

## 2019-09-16 DIAGNOSIS — I252 Old myocardial infarction: Secondary | ICD-10-CM | POA: Diagnosis not present

## 2019-09-16 DIAGNOSIS — J811 Chronic pulmonary edema: Secondary | ICD-10-CM | POA: Diagnosis present

## 2019-09-16 DIAGNOSIS — J9601 Acute respiratory failure with hypoxia: Secondary | ICD-10-CM | POA: Diagnosis present

## 2019-09-16 DIAGNOSIS — D631 Anemia in chronic kidney disease: Secondary | ICD-10-CM | POA: Diagnosis present

## 2019-09-16 DIAGNOSIS — E8770 Fluid overload, unspecified: Secondary | ICD-10-CM | POA: Diagnosis not present

## 2019-09-16 DIAGNOSIS — Z515 Encounter for palliative care: Secondary | ICD-10-CM | POA: Diagnosis not present

## 2019-09-16 DIAGNOSIS — I21A1 Myocardial infarction type 2: Secondary | ICD-10-CM | POA: Diagnosis present

## 2019-09-16 DIAGNOSIS — I509 Heart failure, unspecified: Secondary | ICD-10-CM

## 2019-09-16 DIAGNOSIS — Z7189 Other specified counseling: Secondary | ICD-10-CM

## 2019-09-16 DIAGNOSIS — I2510 Atherosclerotic heart disease of native coronary artery without angina pectoris: Secondary | ICD-10-CM | POA: Diagnosis present

## 2019-09-16 DIAGNOSIS — N400 Enlarged prostate without lower urinary tract symptoms: Secondary | ICD-10-CM | POA: Diagnosis present

## 2019-09-16 DIAGNOSIS — J810 Acute pulmonary edema: Secondary | ICD-10-CM | POA: Diagnosis not present

## 2019-09-16 DIAGNOSIS — N179 Acute kidney failure, unspecified: Secondary | ICD-10-CM | POA: Diagnosis present

## 2019-09-16 DIAGNOSIS — I5043 Acute on chronic combined systolic (congestive) and diastolic (congestive) heart failure: Secondary | ICD-10-CM | POA: Diagnosis not present

## 2019-09-16 DIAGNOSIS — D696 Thrombocytopenia, unspecified: Secondary | ICD-10-CM | POA: Diagnosis present

## 2019-09-16 DIAGNOSIS — E872 Acidosis: Secondary | ICD-10-CM | POA: Diagnosis present

## 2019-09-16 DIAGNOSIS — I130 Hypertensive heart and chronic kidney disease with heart failure and stage 1 through stage 4 chronic kidney disease, or unspecified chronic kidney disease: Secondary | ICD-10-CM | POA: Diagnosis present

## 2019-09-16 DIAGNOSIS — D72829 Elevated white blood cell count, unspecified: Secondary | ICD-10-CM | POA: Diagnosis present

## 2019-09-16 DIAGNOSIS — Z823 Family history of stroke: Secondary | ICD-10-CM

## 2019-09-16 DIAGNOSIS — I447 Left bundle-branch block, unspecified: Secondary | ICD-10-CM | POA: Diagnosis present

## 2019-09-16 DIAGNOSIS — I255 Ischemic cardiomyopathy: Secondary | ICD-10-CM | POA: Diagnosis present

## 2019-09-16 DIAGNOSIS — Z7982 Long term (current) use of aspirin: Secondary | ICD-10-CM

## 2019-09-16 DIAGNOSIS — K8020 Calculus of gallbladder without cholecystitis without obstruction: Secondary | ICD-10-CM | POA: Diagnosis present

## 2019-09-16 DIAGNOSIS — R0602 Shortness of breath: Secondary | ICD-10-CM

## 2019-09-16 DIAGNOSIS — I350 Nonrheumatic aortic (valve) stenosis: Secondary | ICD-10-CM | POA: Diagnosis present

## 2019-09-16 DIAGNOSIS — R54 Age-related physical debility: Secondary | ICD-10-CM | POA: Diagnosis present

## 2019-09-16 DIAGNOSIS — Z79899 Other long term (current) drug therapy: Secondary | ICD-10-CM

## 2019-09-16 DIAGNOSIS — A419 Sepsis, unspecified organism: Secondary | ICD-10-CM | POA: Diagnosis not present

## 2019-09-16 DIAGNOSIS — N184 Chronic kidney disease, stage 4 (severe): Secondary | ICD-10-CM | POA: Diagnosis present

## 2019-09-16 DIAGNOSIS — I361 Nonrheumatic tricuspid (valve) insufficiency: Secondary | ICD-10-CM | POA: Diagnosis not present

## 2019-09-16 DIAGNOSIS — I471 Supraventricular tachycardia: Secondary | ICD-10-CM | POA: Diagnosis present

## 2019-09-16 DIAGNOSIS — Z20822 Contact with and (suspected) exposure to covid-19: Secondary | ICD-10-CM | POA: Diagnosis present

## 2019-09-16 DIAGNOSIS — I129 Hypertensive chronic kidney disease with stage 1 through stage 4 chronic kidney disease, or unspecified chronic kidney disease: Secondary | ICD-10-CM | POA: Diagnosis not present

## 2019-09-16 DIAGNOSIS — I214 Non-ST elevation (NSTEMI) myocardial infarction: Secondary | ICD-10-CM | POA: Diagnosis not present

## 2019-09-16 DIAGNOSIS — I739 Peripheral vascular disease, unspecified: Secondary | ICD-10-CM | POA: Diagnosis present

## 2019-09-16 DIAGNOSIS — J189 Pneumonia, unspecified organism: Secondary | ICD-10-CM | POA: Diagnosis not present

## 2019-09-16 DIAGNOSIS — Z66 Do not resuscitate: Secondary | ICD-10-CM | POA: Diagnosis not present

## 2019-09-16 DIAGNOSIS — E785 Hyperlipidemia, unspecified: Secondary | ICD-10-CM | POA: Diagnosis present

## 2019-09-16 DIAGNOSIS — E43 Unspecified severe protein-calorie malnutrition: Secondary | ICD-10-CM | POA: Diagnosis not present

## 2019-09-16 DIAGNOSIS — Z8249 Family history of ischemic heart disease and other diseases of the circulatory system: Secondary | ICD-10-CM

## 2019-09-16 DIAGNOSIS — I493 Ventricular premature depolarization: Secondary | ICD-10-CM | POA: Diagnosis present

## 2019-09-16 DIAGNOSIS — E860 Dehydration: Secondary | ICD-10-CM | POA: Diagnosis not present

## 2019-09-16 DIAGNOSIS — N320 Bladder-neck obstruction: Secondary | ICD-10-CM | POA: Diagnosis present

## 2019-09-16 DIAGNOSIS — I10 Essential (primary) hypertension: Secondary | ICD-10-CM | POA: Diagnosis not present

## 2019-09-16 DIAGNOSIS — N171 Acute kidney failure with acute cortical necrosis: Secondary | ICD-10-CM | POA: Diagnosis not present

## 2019-09-16 DIAGNOSIS — I5023 Acute on chronic systolic (congestive) heart failure: Secondary | ICD-10-CM | POA: Diagnosis not present

## 2019-09-16 DIAGNOSIS — I340 Nonrheumatic mitral (valve) insufficiency: Secondary | ICD-10-CM | POA: Diagnosis not present

## 2019-09-16 LAB — RENAL FUNCTION PANEL
Albumin: 3.3 g/dL — ABNORMAL LOW (ref 3.5–5.0)
Anion gap: 18 — ABNORMAL HIGH (ref 5–15)
BUN: 111 mg/dL — ABNORMAL HIGH (ref 8–23)
CO2: 15 mmol/L — ABNORMAL LOW (ref 22–32)
Calcium: 8.1 mg/dL — ABNORMAL LOW (ref 8.9–10.3)
Chloride: 106 mmol/L (ref 98–111)
Creatinine, Ser: 5.94 mg/dL — ABNORMAL HIGH (ref 0.61–1.24)
GFR calc Af Amer: 9 mL/min — ABNORMAL LOW (ref >60)
GFR calc non Af Amer: 8 mL/min — ABNORMAL LOW (ref >60)
Glucose, Bld: 119 mg/dL — ABNORMAL HIGH (ref 70–99)
Phosphorus: 8.4 mg/dL — ABNORMAL HIGH (ref 2.5–4.6)
Potassium: 4.8 mmol/L (ref 3.5–5.1)
Sodium: 139 mmol/L (ref 135–145)

## 2019-09-16 LAB — URINALYSIS, ROUTINE W REFLEX MICROSCOPIC
Bilirubin Urine: NEGATIVE
Glucose, UA: NEGATIVE mg/dL
Ketones, ur: NEGATIVE mg/dL
Leukocytes,Ua: NEGATIVE
Nitrite: NEGATIVE
Protein, ur: 30 mg/dL — AB
Specific Gravity, Urine: 1.009 (ref 1.005–1.030)
pH: 5 (ref 5.0–8.0)

## 2019-09-16 LAB — CBC
HCT: 29.4 % — ABNORMAL LOW (ref 39.0–52.0)
Hemoglobin: 9.5 g/dL — ABNORMAL LOW (ref 13.0–17.0)
MCH: 31.8 pg (ref 26.0–34.0)
MCHC: 32.3 g/dL (ref 30.0–36.0)
MCV: 98.3 fL (ref 80.0–100.0)
Platelets: 114 10*3/uL — ABNORMAL LOW (ref 150–400)
RBC: 2.99 MIL/uL — ABNORMAL LOW (ref 4.22–5.81)
RDW: 15 % (ref 11.5–15.5)
WBC: 12 10*3/uL — ABNORMAL HIGH (ref 4.0–10.5)
nRBC: 0 % (ref 0.0–0.2)

## 2019-09-16 LAB — COMPREHENSIVE METABOLIC PANEL
ALT: 36 U/L (ref 0–44)
AST: 56 U/L — ABNORMAL HIGH (ref 15–41)
Albumin: 3.3 g/dL — ABNORMAL LOW (ref 3.5–5.0)
Alkaline Phosphatase: 44 U/L (ref 38–126)
Anion gap: 19 — ABNORMAL HIGH (ref 5–15)
BUN: 106 mg/dL — ABNORMAL HIGH (ref 8–23)
CO2: 15 mmol/L — ABNORMAL LOW (ref 22–32)
Calcium: 8.2 mg/dL — ABNORMAL LOW (ref 8.9–10.3)
Chloride: 105 mmol/L (ref 98–111)
Creatinine, Ser: 5.81 mg/dL — ABNORMAL HIGH (ref 0.61–1.24)
GFR calc Af Amer: 9 mL/min — ABNORMAL LOW (ref >60)
GFR calc non Af Amer: 8 mL/min — ABNORMAL LOW (ref >60)
Glucose, Bld: 128 mg/dL — ABNORMAL HIGH (ref 70–99)
Potassium: 5 mmol/L (ref 3.5–5.1)
Sodium: 139 mmol/L (ref 135–145)
Total Bilirubin: 0.7 mg/dL (ref 0.3–1.2)
Total Protein: 6.2 g/dL — ABNORMAL LOW (ref 6.5–8.1)

## 2019-09-16 LAB — TROPONIN I (HIGH SENSITIVITY)
Troponin I (High Sensitivity): 12172 ng/L (ref <18)
Troponin I (High Sensitivity): 11574 ng/L (ref <18)

## 2019-09-16 LAB — HEPARIN LEVEL (UNFRACTIONATED)
Heparin Unfractionated: 0.23 IU/mL — ABNORMAL LOW (ref 0.30–0.70)
Heparin Unfractionated: 0.27 IU/mL — ABNORMAL LOW (ref 0.30–0.70)

## 2019-09-16 LAB — PROCALCITONIN: Procalcitonin: 0.85 ng/mL

## 2019-09-16 LAB — SARS CORONAVIRUS 2 (TAT 6-24 HRS): SARS Coronavirus 2: NEGATIVE

## 2019-09-16 LAB — CREATININE, URINE, RANDOM: Creatinine, Urine: 58.98 mg/dL

## 2019-09-16 LAB — SODIUM, URINE, RANDOM: Sodium, Ur: 85 mmol/L

## 2019-09-16 MED ORDER — ASPIRIN EC 81 MG PO TBEC
81.0000 mg | DELAYED_RELEASE_TABLET | Freq: Every day | ORAL | Status: DC
  Administered 2019-09-16 – 2019-09-22 (×7): 81 mg via ORAL
  Filled 2019-09-16 (×7): qty 1

## 2019-09-16 MED ORDER — SODIUM CHLORIDE 0.9 % IV SOLN
1.0000 g | INTRAVENOUS | Status: DC
  Administered 2019-09-16 – 2019-09-17 (×2): 1 g via INTRAVENOUS
  Filled 2019-09-16 (×2): qty 10
  Filled 2019-09-16: qty 1

## 2019-09-16 MED ORDER — HEPARIN (PORCINE) 25000 UT/250ML-% IV SOLN
950.0000 [IU]/h | INTRAVENOUS | Status: DC
  Administered 2019-09-16: 700 [IU]/h via INTRAVENOUS
  Administered 2019-09-17: 950 [IU]/h via INTRAVENOUS
  Filled 2019-09-16 (×2): qty 250

## 2019-09-16 MED ORDER — NEPRO/CARBSTEADY PO LIQD
237.0000 mL | ORAL | Status: DC
  Administered 2019-09-16 – 2019-09-19 (×4): 237 mL via ORAL

## 2019-09-16 MED ORDER — ACETAMINOPHEN 325 MG PO TABS
650.0000 mg | ORAL_TABLET | Freq: Four times a day (QID) | ORAL | Status: DC | PRN
  Filled 2019-09-16: qty 2

## 2019-09-16 MED ORDER — DM-GUAIFENESIN ER 30-600 MG PO TB12
1.0000 | ORAL_TABLET | Freq: Two times a day (BID) | ORAL | Status: DC
  Administered 2019-09-16: 1 via ORAL
  Filled 2019-09-16: qty 1

## 2019-09-16 MED ORDER — PRAVASTATIN SODIUM 40 MG PO TABS
40.0000 mg | ORAL_TABLET | Freq: Every day | ORAL | Status: DC
  Administered 2019-09-16 – 2019-09-17 (×2): 40 mg via ORAL
  Filled 2019-09-16 (×2): qty 1

## 2019-09-16 MED ORDER — HEPARIN SODIUM (PORCINE) 5000 UNIT/ML IJ SOLN
5000.0000 [IU] | Freq: Three times a day (TID) | INTRAMUSCULAR | Status: DC
  Administered 2019-09-16: 5000 [IU] via SUBCUTANEOUS
  Filled 2019-09-16: qty 1

## 2019-09-16 MED ORDER — FUROSEMIDE 10 MG/ML IJ SOLN
100.0000 mg | Freq: Four times a day (QID) | INTRAVENOUS | Status: DC
  Administered 2019-09-16 – 2019-09-18 (×9): 100 mg via INTRAVENOUS
  Filled 2019-09-16 (×13): qty 10

## 2019-09-16 MED ORDER — B COMPLEX-C PO TABS
1.0000 | ORAL_TABLET | Freq: Every day | ORAL | Status: DC
  Administered 2019-09-16 – 2019-09-22 (×7): 1 via ORAL
  Filled 2019-09-16 (×7): qty 1

## 2019-09-16 MED ORDER — SODIUM CHLORIDE 0.9 % IV SOLN
500.0000 mg | INTRAVENOUS | Status: DC
  Administered 2019-09-16: 500 mg via INTRAVENOUS
  Filled 2019-09-16 (×2): qty 500

## 2019-09-16 MED ORDER — ACETAMINOPHEN 650 MG RE SUPP: 650.0000 mg | Freq: Four times a day (QID) | RECTAL | Status: DC | PRN

## 2019-09-16 NOTE — Progress Notes (Signed)
VASCULAR LAB PRELIMINARY  PRELIMINARY  PRELIMINARY  PRELIMINARY  Bilateral lower extremity venous duplex completed.    Preliminary report:  See CV proc for preliminary results.   Katelan Hirt, RVT 09/16/2019, 12:00 PM

## 2019-09-16 NOTE — Progress Notes (Signed)
Nutrition Follow-up  DOCUMENTATION CODES:   Not applicable  INTERVENTION:  -Nepro Shake po daily, each supplement provides 425 kcal and 19 grams protein  -B complex with C daily  NUTRITION DIAGNOSIS:   Increased nutrient needs related to chronic illness, acute illness(CKD IV, CHF (EF 35-40%) admitted with pulmonary edema) as evidenced by estimated needs.   GOAL:   Patient will meet greater than or equal to 90% of their needs    MONITOR:   Labs, I & O's, Supplement acceptance, PO intake, Weight trends  REASON FOR ASSESSMENT:   Malnutrition Screening Tool    ASSESSMENT:  RD working remotely.  86 year old male with past medical history of CKD IV (R AV fistula in place), HTN, HLD, CHF (EF 35-40%), PAD, anemia, GERD, and gout who presented to [facility] ED on 1/29 for evaluation of 3 day history of shortness of breath and reports seeing his daughter last week who recently tested positive for COVID-19. CXR showed bilateral patchy opacities concerning for pneumonia; CT chest showed mild cardiomegaly with moderate bilateral pleural effusions. CoV-2 PCR test was negative and patient admitted for pulmonary edema.  Per notes: Worsening/progression of CKD in the setting of acute illness.; suspected volume overload; no HD unless respiratory status demands. Aortic stenosis may have progressed (moderate to severe in 2019); repeat Echo results pending.   Patient on renal diet with 1200 ml fluid restriction. No documented meals at this time for review. Will provide Nepro daily as well as B complex with C vitamin.  Current wt 143 lbs Weight history reviewed, stable over the past year.  Medications reviewed and include: Zithromax, Rocephin, Heparin IVPB - Lasix 100 mg in D5  Labs: BG 128,98,111 x 24 hrs, BUN 106 (H), Cr 5.81 (H), Albumin 3.3 (L), Corrected Ca 8.76 (L), PO4 5 (H), WBC 12 (H), Hgb 9.5 (L)   NUTRITION - FOCUSED PHYSICAL EXAM: Unable to complete at this time, RD working  remotely.  Diet Order:   Diet Order            Diet renal with fluid restriction Fluid restriction: 1200 mL Fluid; Room service appropriate? Yes; Fluid consistency: Thin  Diet effective now              EDUCATION NEEDS:   No education needs have been identified at this time  Skin:  Skin Assessment: Reviewed RN Assessment  Last BM:  1/30 (type 6)  Height:   Ht Readings from Last 1 Encounters:  09/16/19 5\' 10"  (1.778 m)    Weight:   Wt Readings from Last 1 Encounters:  09/16/19 65 kg    Ideal Body Weight:  75.5 kg  BMI:  Body mass index is 20.56 kg/m.  Estimated Nutritional Needs:   Kcal:  1800-2000  Protein:  90-100  Fluid:  1.2 L/day per MD   Lajuan Lines, RD, LDN Clinical Nutrition [place] Telephone [phone] After Hours/Weekend Pager: [phone]

## 2019-09-16 NOTE — Progress Notes (Signed)
Report called to 3 east for patient transfer.

## 2019-09-16 NOTE — Progress Notes (Signed)
ANTICOAGULATION CONSULT NOTE - Initial Consult  Pharmacy Consult for Heparin Indication: chest pain/ACS  No Known Allergies  Patient Measurements: Height: 5\' 10"  (177.8 cm) Weight: 142 lb 13.7 oz (64.8 kg) IBW/kg (Calculated) : 73  Vital Signs: Temp: 98 F (36.7 C) (01/30 0308) Temp Source: Oral (01/30 0308) BP: 126/68 (01/30 0308) Pulse Rate: 86 (01/30 0308)  Labs: Recent Labs    09/16/19 0517  HGB 9.5*  HCT 29.4*  PLT 114*  CREATININE 5.81*  TROPONINIHS 12,172*    Estimated Creatinine Clearance: 8.1 mL/min (A) (by C-G formula based on SCr of 5.81 mg/dL (H)).   Medical History: Past Medical History:  Diagnosis Date  . Acute on chronic combined systolic (congestive) and diastolic (congestive) heart failure ([place])   . CAD (coronary artery disease)   . Chronic kidney disease (CKD), stage IV (severe) ([place])   . Dyslipidemia   . Hyperlipidemia   . Hypertension     Medications:  Medications Prior to Admission  Medication Sig Dispense Refill Last Dose  . allopurinol (ZYLOPRIM) 100 MG tablet Take 100 mg by mouth daily.   09/14/2019  . aspirin EC 81 MG tablet Take 81 mg by mouth daily.   09/14/2019  . carvedilol (COREG) 25 MG tablet Take 1 tablet (25 mg total) by mouth 2 (two) times daily. 180 tablet 0 09/14/2019  . isosorbide-hydrALAZINE (BIDIL) 20-37.5 MG tablet Take 1 tablet by mouth 3 (three) times daily. 270 tablet 0 09/14/2019  . pravastatin (PRAVACHOL) 40 MG tablet Take 1 tablet (40 mg total) by mouth at bedtime. 90 tablet 0 09/14/2019    Assessment: 86 y.o. male with SOB and elevated cardiac markers for heparin.  Heparin infusion was started at [facility], d/c'd on transfer, Heparin 5000 units SQ for DVT prophylaxis given at 0600  Goal of Therapy:  Heparin level 0.3-0.7 units/ml Monitor platelets by anticoagulation protocol: Yes   Plan:  Restart heparin 700 units/hr Check heparin level in 8 hours.   Christhoper Busbee, Bronson Curb 09/16/2019,6:53 AM

## 2019-09-16 NOTE — Progress Notes (Signed)
CRITICAL VALUE STICKER  CRITICAL VALUE: Troponin 12,172  RECEIVER (on-site recipient of call): Gorden Harms, RN  DATE & TIME NOTIFIED: 09/16/2019 @ [phone]  MESSENGER (representative from lab): Lynnell Chad   MD NOTIFIED: [place]   TIME OF NOTIFICATION: [id number]  RESPONSE: Pending

## 2019-09-16 NOTE — Consult Note (Addendum)
Renal Service Consult Note Tanner Pearson 09/16/2019 [place] Requesting Physician:  Dr Dorris Singh  Reason for Consult:  CKD IV pateint w/ SOB and pulm edema HPI: The patient is a 86 y.o. year-old f/b Dr [name] for CKD IV, also HTN, HL, CHF and has R FA AVF in place but has not started HD yet.  Pt presented to [facility] on 1/29 for SOB w/ orthopnea and cough, no leg swelling. Pt transferred here.  COVID neg and [place] and here. Creat 5.81 and BUN 106. Asked to see for renal fialure.    Last labs here from 2016 showed creat of 3.8- 4.1.  Pt is f/b Dr [name] at [facility].   Pt states he was being seen every 6 mos until the pandemic but hasn't been seen for perhaps about a year.  He takes lasix 40 po at home daily. Denies any leg/ arm swelling.  Has AVF R forearm 9 yts old , never used.  Deneis any prod cough or fevers. Denies loss of appetite, jerking or confusion.     ROS  denies CP  no joint pain   no HA  no blurry vision  no rash  no diarrhea  no nausea/ vomiting   Past Medical History  Past Medical History:  Diagnosis Date  . Acute on chronic combined systolic (congestive) and diastolic (congestive) heart failure ([place])   . CAD (coronary artery disease)   . Chronic kidney disease (CKD), stage IV (severe) ([place])   . Dyslipidemia   . Hyperlipidemia   . Hypertension    Past Surgical History  Past Surgical History:  Procedure Laterality Date  . AV FISTULA PLACEMENT, RADIOCEPHALIC    . CATARACT EXTRACTION     Family History  Family History  Problem Relation Age of Onset  . Polycystic kidney disease Brother   . Polycystic kidney disease Sister   . Hypertension Mother   . Stroke Mother    Social History  reports that he has quit smoking. He has never used smokeless tobacco. He reports current alcohol use. He reports that he does not use drugs. Allergies No Known Allergies Home medications Prior to Admission medications   Medication Sig  Start Date End Date Taking? Authorizing Provider  allopurinol (ZYLOPRIM) 100 MG tablet Take 100 mg by mouth daily.    [provider]  aspirin EC 81 MG tablet Take 81 mg by mouth daily.    [provider]  carvedilol (COREG) 25 MG tablet Take 1 tablet (25 mg total) by mouth 2 (two) times daily. 09/05/19   Park Liter, MD  isosorbide-hydrALAZINE (BIDIL) 20-37.5 MG tablet Take 1 tablet by mouth 3 (three) times daily. 09/05/19   Park Liter, MD  pravastatin (PRAVACHOL) 40 MG tablet Take 1 tablet (40 mg total) by mouth at bedtime. 09/05/19   Park Liter, MD   Recent Labs  Lab 09/16/19 0517  WBC 12.0*  HGB 9.5*  HCT 29.4*  MCV 98.3  PLT 114*   Recent Labs  Lab 09/16/19 0517  NA 139  K 5.0  CL 105  CO2 15*  GLUCOSE 128*  BUN 106*  CREATININE 5.81*  CALCIUM 8.2*   Iron/TIBC/Ferritin/ %Sat No results found for: IRON, TIBC, FERRITIN, IRONPCTSAT  Vitals:   09/16/19 0308 09/16/19 0906 09/16/19 1128  BP: 126/68 122/70   Pulse: 86 85   Resp: 16  (!) 25  Temp: 98 F (36.7 C)  97.9 F (36.6 C)  TempSrc:  Oral  Oral  SpO2: 100% 99%   Weight: 64.8 kg    Height: 5\' 10"  (1.778 m)      Exam Gen alert, nasal O2 No rash, cyanosis or gangrene Sclera anicteric, throat clear  No jvd or bruits Chest bibasilar soft rales, on wheezing RRR no MRG Abd soft ntnd no mass or ascites +bs GU normal male MS no joint effusions or deformity Ext trace LE edema Neuro is alert, Ox 3 , nf R FA AVF +bruit    Home meds:  - carvedilol 25 bid/ isosorbide-hydralazine 20-37.5mg  tid  - pravastatin 40 hs/ aspirin 81  - allopurinol 100    CXR - bilat IS edema, mild-mod    B/Cr 106/ 5.81 , K 5.0  Na 139   Alb 3.3  Hb 9.5    Creat in 2016 here was 3.8- 4.2   Assessment/ Plan:  SOB/ pulm edema - suspect vol overload in CKD patient. Will start high dose IV lasix, get bladder scan as well r/o retention.  Stable on nasal O2.   AKI on CKD IV - baseline creat 3.8-  4, but no labs here since 2016. Get clinic records. Get UA, lytes and renal US. Not uremic. No HD unless resp status demands.   HTN - agree w/ holding home coreg and hydralazine, BP's are normal   HL  H/o CAD  H/o combined syst/ diast CHF    Kelly Splinter  MD 09/16/2019, 11:35 AM

## 2019-09-16 NOTE — Progress Notes (Signed)
ANTICOAGULATION CONSULT NOTE - Follow Up Consult  Pharmacy Consult for Heparin Indication: chest pain/ACS  No Known Allergies  Patient Measurements: Height: 5\' 10"  (177.8 cm) Weight: 143 lb 14.4 oz (65.3 kg) IBW/kg (Calculated) : 73 Heparin Dosing Weight: 65 kg  Vital Signs: Temp: 97.9 F (36.6 C) (01/30 2021) Temp Source: Oral (01/30 2021) BP: 109/78 (01/30 2021) Pulse Rate: 64 (01/30 2021)  Labs: Recent Labs    09/16/19 0517 09/16/19 0956 09/16/19 1352 09/16/19 2211  HGB 9.5*  --   --   --   HCT 29.4*  --   --   --   PLT 114*  --   --   --   HEPARINUNFRC  --   --  0.23* 0.27*  CREATININE 5.81*  --  5.94*  --   TROPONINIHS 12,172* 11,574*  --   --     Estimated Creatinine Clearance: 7.9 mL/min (A) (by C-G formula based on SCr of 5.94 mg/dL (H)).  Assessment:  Anticoag: Elevated troponin. IV heparin drip 800 uts/hr HL 0.27 slightly<goal. - VQ scan done at [facility] low risk for PE.  elevated troponin   Goal of Therapy:  Heparin level 0.3-0.7 units/ml Monitor platelets by anticoagulation protocol: Yes   Plan:  Increase IV heparin to 950 units/hr Daily HL and CBC   Bonnita Nasuti Pharm.D. CPP, BCPS Clinical Pharmacist [phone] 09/16/2019 10:57 PM

## 2019-09-16 NOTE — Progress Notes (Addendum)
  BRIEF PROGRESS NOTE    Tanner Pearson  [id number] DOB: 09-06-1928 DOA: 09/16/2019 PCP: Ernestene Kiel, MD   Brief Narrative:   86 year old with history of CKD IV, AS, HFrEF, PAD, presenting with volume overload to [facility]. ED course notable for COVID PCR negative (now X2), negative influenza. Labs notable for AKI on CKD, elevated troponin. CXR demonstrated concern for pneumonia, CT chest showed bilateral effusions. VQ scan reported to be low probability, LE duplex negative for DVT. Patient started on treatment for CAP Nephrology consulted for AKI on CKD.     09/16/19:  Patient seen this AM while on airborne precautions. Reports he feels okay. Oxygen therapy has improved breathing. Denies chest pain.  HEENT: Sclera anicteric. Tired but appropriate, alert Neck: JVP elevated  Cardiac: Regular rate and rhythm. Normal S1/S2. Systolic murmur throughout precordium  Lungs: Coarse and diminished at bases  Abdomen: Normoactive bowel sounds. No tenderness to deep or light palpation.   Extremities: Trace edema  Alert, oriented to person, place, reason for admission, does not know day of week    Assessment & Plan:   Principal Problem:   Pulmonary edema Active Problems:   Essential hypertension   Chronic kidney disease (CKD), stage IV (severe) (HCC)   CHF exacerbation (H[facility])   AKI (acute kidney injury) ([place])  Acute Hypoxemic Respiratory Failure with Pulmonary Edema and CAP, likely due to worsening/progression of CKD in setting of acute illness. Aortic stenosis may have also progressed, moderate to severe on 2019 echocardiogram.  - For CAP, currently on azithromycin (day 2/3) and ceftriaxone (day 2 of 7). - For pulmonary edema, furosemide ordered per Nephrology, appreciate recommendations - COVID negative X2--transfer to alternate unit  - Wean oxygen goal O2 sat >92% - Incentive spirometry - Follow up blood cultures   Acute kidney injury on CKD IV, baseline creatinine ~ 4  at last value in system - Nephrology consulted, appreciate recommendations - Follow up renal ultrasound, FeNa - Strict I/O, bladder scans - Avoid nephrotoxic agents  NSTEMI, likely due to demand ischemia in setting of acute illness, AKI  - Denies chest pain - Echocardiogram ordered--if no wall motion abnormalities, can likely DC heparin drip - Cardiology curbsided by admitting MD--see details  - Telemetry - Repeat EKG ordered - Restarted ASA and statin  - Continue heparin drip for now   Aortic stenosis - Caution with excess diuresis/fluids given moderate to severe - Repeat echocardiogram -> if AS progressed, consult Cardiology   HLD - Pravastatin  HTN - Hold Carvedilol and BiDil  CAD Patient denies any prior stent placement---continue ASA   Normocytic anemia - Iron studies ordered   DVT prophylaxis: Heparin drip   Code Status: FULL  Family Communication: Discussed with Patient this AM   Remainder per H and P from earlier today.   Dorris Singh, MD   [facility] Pager [phone]  If 7PM-7AM, please contact night-coverage [email or web address] Password [email or web address] 09/16/2019, 1:13 PM

## 2019-09-16 NOTE — Progress Notes (Signed)
ANTICOAGULATION CONSULT NOTE - Follow Up Consult  Pharmacy Consult for Heparin Indication: chest pain/ACS  No Known Allergies  Patient Measurements: Height: 5\' 10"  (177.8 cm) Weight: 143 lb 4.8 oz (65 kg) IBW/kg (Calculated) : 73 Heparin Dosing Weight: 65 kg  Vital Signs: Temp: 97.9 F (36.6 C) (01/30 1128) Temp Source: Oral (01/30 1128) BP: 122/70 (01/30 0906) Pulse Rate: 85 (01/30 0906)  Labs: Recent Labs    09/16/19 0517 09/16/19 0956 09/16/19 1352  HGB 9.5*  --   --   HCT 29.4*  --   --   PLT 114*  --   --   HEPARINUNFRC  --   --  0.23*  CREATININE 5.81*  --  5.94*  TROPONINIHS 12,172* 11,574*  --     Estimated Creatinine Clearance: 7.9 mL/min (A) (by C-G formula based on SCr of 5.94 mg/dL (H)).  Assessment:  Anticoag: Elevated troponin. IV heparin . HL 0.23 slightly<goal. - VQ scan done at [facility] low risk for PE.    Goal of Therapy:  Heparin level 0.3-0.7 units/ml Monitor platelets by anticoagulation protocol: Yes   Plan:  Increase IV heparin to 800 units/hr Recheck HL in 6 hrs Daily HL and CBC   Wing Schoch S. Alford Highland, PharmD, BCPS Clinical Staff Pharmacist [email or web address] Alford Highland, [employer] 09/16/2019,2:46 PM

## 2019-09-16 NOTE — Progress Notes (Signed)
VASCULAR LAB PRELIMINARY  PRELIMINARY  PRELIMINARY  PRELIMINARY  Bilateral lower extremity venous duplex completed.    Preliminary report:  See CV proc for preliminary results.   Zarai Orsborn, RVT 09/16/2019, 12:01 PM

## 2019-09-16 NOTE — H&P (Addendum)
History and Physical    Tanner Pearson [id number] DOB: 20-Aug-1928 DOA: 09/16/2019  PCP: Ernestene Kiel, MD Patient coming from: [facility] ED  Chief Complaint: Shortness of breath  HPI: Tanner Pearson is a 86 y.o. male with medical history significant of CKD stage IV followed by Dr. [name] and has a right forearm AV fistula in place but not started dialysis yet, hypertension, hyperlipidemia, CHF with echo done in April 2016 EF 35 to 40%, PAD, anemia, GERD, gout presented to [facility] ED on 1/29 for evaluation of shortness of breath.  Patient reports 3-day history of shortness of breath and cough.  Also reports orthopnea.  No lower extremity edema.  Denies fevers, chills, fatigue, body aches, vomiting, abdominal pain, or diarrhea.  States he saw his daughter a week ago and she recently tested positive for COVID-19.  ED course at [facility]:  EMS reported oxygen saturation 95% on room air. Tachycardic and tachypneic in the ED, blood pressure 156/78.  Patient was placed on 2 L supplemental oxygen due to increased work of breathing and tachypnea.  SARS-CoV-2 PCR test negative, influenza panel negative WBC count 14.6, hemoglobin 10.4, MCV 99, platelet count 149 Procalcitonin 0.08 Sodium 140, potassium 4.9, chloride 111, bicarb 14, anion gap 20, BUN 88, creatinine 5.4, glucose 247 Lactic acid 3.5 >2.1 after IV fluid LFTs normal A1c 5.5 BNP significantly elevated at 38,600 D-dimer elevated at 3393 Troponin 0.10 >0.26 >0.69 >9.5.  EKG with LBBB, no recent tracing for comparison. FOBT negative Blood cultures pending  Chest x-ray showing bilateral patchy opacities concerning for pneumonia. CT chest without contrast showing mild cardiomegaly with moderate bilateral pleural effusions, diffuse interlobular septal thickening and redistribution of the pulmonary vascularity and peribronchovascular cuffing.  Findings favored to reflect a combination of CHF and pulmonary edema.  Superimposed  multifocal pneumonia also a consideration given multifocal regions of peripheral consolidation.  Also showing mild thoracic esophageal mural thickening, outpatient endoscopy recommended for further evaluation.  Extensive coronary artery calcifications.  Aortic atherosclerosis.  Cholelithiasis without findings of acute cholecystitis.  Mild bilateral perinephric stranding.  VQ scan low probability for PE.  Patient was treated with ceftriaxone and azithromycin.  Patient was given 2 L IV fluid per sepsis protocol in the ED.  He was started on bicarb drip for metabolic acidosis.  Also started on heparin infusion  Review of Systems:  All systems reviewed and apart from history of presenting illness, are negative.  Past Medical History:  Diagnosis Date  . 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 CAD (coronary artery disease)   . Chronic kidney disease (CKD), stage IV (severe) ([place])   . Dyslipidemia   . Hyperlipidemia   . Hypertension     Past Surgical History:  Procedure Laterality Date  . AV FISTULA PLACEMENT, RADIOCEPHALIC    . CATARACT EXTRACTION       reports that he has quit smoking. He has never used smokeless tobacco. He reports current alcohol use. He reports that he does not use drugs.  No Known Allergies  Family History  Problem Relation Age of Onset  . Polycystic kidney disease Brother   . Polycystic kidney disease Sister   . Hypertension Mother   . Stroke Mother     Prior to Admission medications   Medication Sig Start Date End Date Taking? Authorizing Provider  allopurinol (ZYLOPRIM) 100 MG tablet Take 100 mg by mouth daily.    [provider]  aspirin EC 81 MG tablet  Take 81 mg by mouth daily.    [provider]  carvedilol (COREG) 25 MG tablet Take 1 tablet (25 mg total) by mouth 2 (two) times daily. 09/05/19   Park Liter, MD  isosorbide-hydrALAZINE (BIDIL) 20-37.5 MG tablet Take 1  tablet by mouth 3 (three) times daily. 09/05/19   Park Liter, MD  pravastatin (PRAVACHOL) 40 MG tablet Take 1 tablet (40 mg total) by mouth at bedtime. 09/05/19   Park Liter, MD    Physical Exam: Vitals:   09/16/19 0308  BP: 126/68  Pulse: 86  Resp: 16  Temp: 98 F (36.7 C)  TempSrc: Oral  SpO2: 100%  Weight: 64.8 kg  Height: 5\' 10"  (1.778 m)    Physical Exam  Constitutional: He is oriented to person, place, and time. He appears well-developed and well-nourished. No distress.  HENT:  Head: Normocephalic.  Eyes: Right eye exhibits no discharge. Left eye exhibits no discharge.  Cardiovascular: Normal rate, regular rhythm and intact distal pulses.  Pulmonary/Chest: He has no wheezes.  Slightly tachypneic but speaking clearly in full sentences Equal air entry bilaterally  Abdominal: Soft. Bowel sounds are normal. He exhibits no distension. There is no abdominal tenderness. There is no guarding.  Musculoskeletal:        General: No edema.     Cervical back: Neck supple.     Comments: Right forearm AV fistula with palpable thrill  Neurological: He is alert and oriented to person, place, and time.  Skin: Skin is warm and dry. He is not diaphoretic.     Labs on Admission: I have personally reviewed following labs and imaging studies  CBC: No results for input(s): WBC, NEUTROABS, HGB, HCT, MCV, PLT in the last 168 hours. Basic Metabolic Panel: No results for input(s): NA, K, CL, CO2, GLUCOSE, BUN, CREATININE, CALCIUM, MG, PHOS in the last 168 hours. GFR: CrCl cannot be calculated (Patient's most recent lab result is older than the maximum 21 days allowed.). Liver Function Tests: No results for input(s): AST, ALT, ALKPHOS, BILITOT, PROT, ALBUMIN in the last 168 hours. No results for input(s): LIPASE, AMYLASE in the last 168 hours. No results for input(s): AMMONIA in the last 168 hours. Coagulation Profile: No results for input(s): INR, PROTIME in the last 168  hours. Cardiac Enzymes: No results for input(s): CKTOTAL, CKMB, CKMBINDEX, TROPONINI in the last 168 hours. BNP (last 3 results) No results for input(s): PROBNP in the last 8760 hours. HbA1C: No results for input(s): HGBA1C in the last 72 hours. CBG: No results for input(s): GLUCAP in the last 168 hours. Lipid Profile: No results for input(s): CHOL, HDL, LDLCALC, TRIG, CHOLHDL, LDLDIRECT in the last 72 hours. Thyroid Function Tests: No results for input(s): TSH, T4TOTAL, FREET4, T3FREE, THYROIDAB in the last 72 hours. Anemia Panel: No results for input(s): VITAMINB12, FOLATE, FERRITIN, TIBC, IRON, RETICCTPCT in the last 72 hours. Urine analysis: No results found for: COLORURINE, APPEARANCEUR, LABSPEC, PHURINE, GLUCOSEU, HGBUR, BILIRUBINUR, KETONESUR, PROTEINUR, UROBILINOGEN, NITRITE, LEUKOCYTESUR  Radiological Exams on Admission: No results found.  Assessment/Plan Principal Problem:   Pulmonary edema Active Problems:   Essential hypertension   Chronic kidney disease (CKD), stage IV (severe) (HCC)   CHF exacerbation (HCC)   AKI (acute kidney injury) (HCC)   Respiratory distress secondary to pulmonary edema/pleural effusions from acutely decompensated CHF and AKI on CKD stage IV Possible multifocal pneumonia -CT chest without contrast showing mild cardiomegaly with moderate bilateral pleural effusions, diffuse interlobular septal thickening and redistribution of the pulmonary  vascularity and peribronchovascular cuffing.  Findings favored to reflect a combination of CHF and pulmonary edema.  Superimposed multifocal pneumonia also a consideration given multifocal regions of peripheral consolidation.  -Will hold off giving diuretic given advanced kidney disease.  Patient will likely need to be started on dialysis.  Consult nephrology in a.m.  -Monitor intake and output, daily weights, fluid restriction -Per [facility] records, last echocardiogram from April 2016 with EF 35 to 40%.  Repeat  echocardiogram ordered. -Continue ceftriaxone and azithromycin, pending blood cultures.  Monitor WBC count and procalcitonin level. -Mucinex DM -Influenza panel negative -SARS-CoV-2 PCR test negative.  However, physician at [facility] was concerned and wanted repeat testing as patient recently had close contact with a family member who tested positive for COVID-19.  Repeat SARS-CoV-2 PCR test ordered.  Continue airborne and contact precautions. -Not hypoxic.  Continue supplemental oxygen for mild tachypnea/increased work of breathing.  AKI on CKD stage IV -BUN 88, creatinine 5.4.  Per [facility] records, last creatinine 2.9 in February 2018. -Patient has a right forearm AV fistula in place (never used) -Consult nephrology in a.m. -Avoid nephrotoxic agents/contrast -Monitor renal function and urine output  High anion gap metabolic acidosis -Bicarb 14, anion gap 20 -Suspect secondary to AKI on CKD stage IV -Repeat labs and give bicarb supplementation accordingly -Nephrology consultation in a.m. as mentioned above  Lactic acidosis (resolved) -Lactic acid 3.5 >2.1 after IV fluid in the ED -Suspect related to above  Elevated D-dimer D-dimer 3393.  Suspect related to advanced kidney disease.  VQ scan done at [facility] low risk for PE.   -Although no obvious clinical signs of DVT on exam, will order bilateral lower extremity Dopplers  Elevated troponin Patient has no complaints of chest pain and appears comfortable. Troponin elevated on labs done at [facility] (0.10 >0.26 >0.69 >9.5).  EKG at [facility] with LBBB, no recent tracing for comparison.  Suspect troponin elevation is due to demand ischemia in the setting of acutely decompensated CHF and advanced kidney disease. -Cardiac monitoring -Check EKG -Trend troponin  Addendum: High-sensitivity troponin 12,172.  Patient is chest pain-free.  Repeat EKG done here with minimal ST elevations in V1 and V2.  Spoke to Dr. Ellyn Hack who reviewed the  patient's EKG from here and EKGs from [facility] and feels there is no significant change.  He feels the patient's troponin elevation is related to demand ischemia/ AKI on CKD stage IV and there is no need to take him to the Cath Lab at this time. -Heparin per ACS protocol -Continue to trend troponin -Echocardiogram as mentioned above -Nephrology consultation in a.m. as mentioned above  Essential hypertension -Currently normotensive.  Resume home meds when med rec is complete.  Mild thoracic esophageal mural thickening on CT without contrast -Outpatient evaluation with endoscopy recommended  Pharmacy med rec pending.  DVT prophylaxis: Subcutaneous heparin Code Status: Patient wishes to be full code. Family Communication: No family available at this time. Disposition Plan: Anticipate discharge after clinical improvement. Admission status: It is my clinical opinion that admission to INPATIENT is reasonable and necessary in this 86 y.o. male presenting with respiratory distress secondary to pulmonary edema/pleural effusions from acutely decompensated CHF and AKI on CKD stage V.  In addition, has possible multifocal pneumonia.  Patient will likely need dialysis, pending nephrology consultation in a.m. High risk of decompensation.  Given the aforementioned, the predictability of an adverse outcome is felt to be significant. I expect that the patient will require at least 2 midnights in the hospital to  treat this condition.   The medical decision making on this patient was of high complexity and the patient is at high risk for clinical deterioration, therefore this is a level 3 visit.  Shela Leff MD [facility]  If 7PM-7AM, please contact night-coverage [email or web address] Password [email or web address]  09/16/2019, 5:00 AM

## 2019-09-17 ENCOUNTER — Inpatient Hospital Stay (HOSPITAL_COMMUNITY): Payer: Medicare Other

## 2019-09-17 DIAGNOSIS — N184 Chronic kidney disease, stage 4 (severe): Secondary | ICD-10-CM

## 2019-09-17 DIAGNOSIS — I10 Essential (primary) hypertension: Secondary | ICD-10-CM

## 2019-09-17 DIAGNOSIS — I340 Nonrheumatic mitral (valve) insufficiency: Secondary | ICD-10-CM

## 2019-09-17 DIAGNOSIS — I361 Nonrheumatic tricuspid (valve) insufficiency: Secondary | ICD-10-CM

## 2019-09-17 DIAGNOSIS — N171 Acute kidney failure with acute cortical necrosis: Secondary | ICD-10-CM

## 2019-09-17 DIAGNOSIS — I5023 Acute on chronic systolic (congestive) heart failure: Secondary | ICD-10-CM

## 2019-09-17 LAB — IRON AND TIBC
Iron: 40 ug/dL — ABNORMAL LOW (ref 45–182)
Saturation Ratios: 19 % (ref 17.9–39.5)
TIBC: 213 ug/dL — ABNORMAL LOW (ref 250–450)
UIBC: 173 ug/dL

## 2019-09-17 LAB — RENAL FUNCTION PANEL
Albumin: 3.2 g/dL — ABNORMAL LOW (ref 3.5–5.0)
Anion gap: 17 — ABNORMAL HIGH (ref 5–15)
BUN: 121 mg/dL — ABNORMAL HIGH (ref 8–23)
CO2: 17 mmol/L — ABNORMAL LOW (ref 22–32)
Calcium: 8 mg/dL — ABNORMAL LOW (ref 8.9–10.3)
Chloride: 106 mmol/L (ref 98–111)
Creatinine, Ser: 5.8 mg/dL — ABNORMAL HIGH (ref 0.61–1.24)
GFR calc Af Amer: 9 mL/min — ABNORMAL LOW (ref >60)
GFR calc non Af Amer: 8 mL/min — ABNORMAL LOW (ref >60)
Glucose, Bld: 129 mg/dL — ABNORMAL HIGH (ref 70–99)
Phosphorus: 7.8 mg/dL — ABNORMAL HIGH (ref 2.5–4.6)
Potassium: 4.3 mmol/L (ref 3.5–5.1)
Sodium: 140 mmol/L (ref 135–145)

## 2019-09-17 LAB — CBC
HCT: 27.8 % — ABNORMAL LOW (ref 39.0–52.0)
Hemoglobin: 9.2 g/dL — ABNORMAL LOW (ref 13.0–17.0)
MCH: 32.3 pg (ref 26.0–34.0)
MCHC: 33.1 g/dL (ref 30.0–36.0)
MCV: 97.5 fL (ref 80.0–100.0)
Platelets: 112 10*3/uL — ABNORMAL LOW (ref 150–400)
RBC: 2.85 MIL/uL — ABNORMAL LOW (ref 4.22–5.81)
RDW: 14.8 % (ref 11.5–15.5)
WBC: 11.8 10*3/uL — ABNORMAL HIGH (ref 4.0–10.5)
nRBC: 0 % (ref 0.0–0.2)

## 2019-09-17 LAB — ECHOCARDIOGRAM COMPLETE
Height: 70 in
Weight: 2302.4 oz

## 2019-09-17 LAB — HEPARIN LEVEL (UNFRACTIONATED): Heparin Unfractionated: 0.44 IU/mL (ref 0.30–0.70)

## 2019-09-17 LAB — FERRITIN: Ferritin: 118 ng/mL (ref 24–336)

## 2019-09-17 MED ORDER — PERFLUTREN LIPID MICROSPHERE
INTRAVENOUS | Status: AC
  Filled 2019-09-17: qty 10

## 2019-09-17 NOTE — Progress Notes (Signed)
ANTICOAGULATION CONSULT NOTE - Follow Up Consult  Pharmacy Consult for Heparin Indication: chest pain/ACS  No Known Allergies  Patient Measurements: Height: 5\' 10"  (177.8 cm) Weight: 143 lb 14.4 oz (65.3 kg) IBW/kg (Calculated) : 73 Heparin Dosing Weight: 65 kg  Vital Signs: Temp: 97.8 F (36.6 C) (01/31 0548) Temp Source: Oral (01/31 0548) BP: 117/90 (01/31 0548) Pulse Rate: 82 (01/31 0548)  Labs: Recent Labs    09/16/19 0517 09/16/19 0956 09/16/19 1352 09/16/19 2211 09/17/19 0538  HGB 9.5*  --   --   --  9.2*  HCT 29.4*  --   --   --  27.8*  PLT 114*  --   --   --  112*  HEPARINUNFRC  --   --  0.23* 0.27* 0.44  CREATININE 5.81*  --  5.94*  --  5.80*  TROPONINIHS 12,172* 11,574*  --   --   --     Estimated Creatinine Clearance: 8.1 mL/min (A) (by C-G formula based on SCr of 5.8 mg/dL (H)).  Assessment:  Anticoag: Elevated troponin. IV heparin drip 950 uts/hr HL 0.44 at goal. CBC low but stable. No overt bleeding or infusion issues noted. - VQ scan done at [facility] low risk for PE.   Goal of Therapy:  Heparin level 0.3-0.7 units/ml Monitor platelets by anticoagulation protocol: Yes   Plan:  Continue IV heparin at 950 units/hr Daily HL and CBC Monitor for signs of bleeding  Richardine Service, PharmD PGY1 Pharmacy Resident Phone: [phone] 09/17/2019  9:02 AM  Please check [email or web address] for unit-specific pharmacy phone numbers.

## 2019-09-17 NOTE — Progress Notes (Signed)
  Echocardiogram 2D Echocardiogram has been performed.  Tanner Pearson 09/17/2019, 9:15 AM

## 2019-09-17 NOTE — Progress Notes (Signed)
[facility] Progress Note  Subjective: SOB a little better, says he is voiding a lot, but really is putting out about 100 cc periodically.  600 cc out overnight.   Vitals:   09/16/19 1459 09/16/19 2021 09/16/19 2358 09/17/19 0548  BP: 128/75 109/78 107/69 117/90  Pulse: 90 64 89 82  Resp: 20 18 18 18   Temp: 97.7 F (36.5 C) 97.9 F (36.6 C) 97.9 F (36.6 C) 97.8 F (36.6 C)  TempSrc: Oral Oral Oral Oral  SpO2: 100% 98% 100% 100%  Weight: 65.3 kg     Height: 5\' 10"  (1.778 m)       Exam: Gen alert, nasal O2 No rash, cyanosis or gangrene Sclera anicteric, throat clear  No jvd or bruits Chest bibasilar soft rales, no wheezing RRR no MRG Abd soft ntnd no mass or ascites +bs GU normal male MS no joint effusions or deformity Ext trace LE edema Neuro is alert, Ox 3 , nf R FA AVF +bruit    Home meds:  - carvedilol 25 bid/ isosorbide-hydralazine 20-37.5mg  tid  - pravastatin 40 hs/ aspirin 81  - allopurinol 100    CXR - bilat IS edema, mild-mod    B/Cr 106/ 5.81 , K 5.0  Na 139   Alb 3.3  Hb 9.5    Creat in 2016 here was 3.8- 4.2   UA neg     [place] 8-9 cm no hydro  Assessment/ Plan:  SOB/ pulm edema - doubt PNA, looks like vol overload in CKD patient. Not a great response to IV lasix (100 qid) but is voiding and possibly more frequently today and SOB is improving. Pt not sure about dialysis and wants time to think about it. He is not uremic so we should have some time to cont diuresing. Cont IV lasix.  Daughter is hospice nurse for hospice home in [place], her number is [phone] and she would appreciate updates (at home w/ +COVID).   AKI on CKD IV - baseline creat 3.8- 4. Get clinic records monday. UA and US unremarkable. Not uremic as above.   HTN - agree w/ holding home coreg and hydralazine, BP's  normal   HL  H/o CAD  H/o combined syst/ diast CHF        Tanner Pearson 09/17/2019, 11:20 AM  Inpatient medications: . aspirin EC  81 mg  Oral Daily  . B-complex with vitamin C  1 tablet Oral Daily  . feeding supplement (NEPRO CARB STEADY)  237 mL Oral Q24H  . perflutren lipid microspheres (DEFINITY) IV suspension      . pravastatin  40 mg Oral q1800   . azithromycin 500 mg (09/16/19 2231)  . cefTRIAXone (ROCEPHIN)  IV 1 g (09/17/19 0942)  . furosemide 100 mg (09/17/19 [id number])  . heparin 950 Units/hr (09/16/19 2310)   acetaminophen **OR** acetaminophen Recent Labs  Lab 09/16/19 1352 09/17/19 0538  NA 139 140  K 4.8 4.3  CL 106 106  CO2 15* 17*  GLUCOSE 119* 129*  BUN 111* 121*  CREATININE 5.94* 5.80*  CALCIUM 8.1* 8.0*  PHOS 8.4* 7.8*

## 2019-09-17 NOTE — Progress Notes (Signed)
PROGRESS NOTE    JANA MASTIN  [id number] DOB: Aug 18, 1928 DOA: 09/16/2019 PCP: Ernestene Kiel, MD  Brief Narrative:  HPI per Dr. Shela Leff on 09/16/2019 ELIAH NEYLAND is a 86 y.o. male with medical history significant of CKD stage IV followed by Dr. [name] and has a right forearm AV fistula in place but not started dialysis yet, hypertension, hyperlipidemia, CHF with echo done in April 2016 EF 35 to 40%, PAD, anemia, GERD, gout presented to [facility] ED on 1/29 for evaluation of shortness of breath.  Patient reports 3-day history of shortness of breath and cough.  Also reports orthopnea.  No lower extremity edema.  Denies fevers, chills, fatigue, body aches, vomiting, abdominal pain, or diarrhea.  States he saw his daughter a week ago and she recently tested positive for COVID-19.  ED course at [facility]:  EMS reported oxygen saturation 95% on room air. Tachycardic and tachypneic in the ED, blood pressure 156/78.  Patient was placed on 2 L supplemental oxygen due to increased work of breathing and tachypnea.  SARS-CoV-2 PCR test negative, influenza panel negative WBC count 14.6, hemoglobin 10.4, MCV 99, platelet count 149 Procalcitonin 0.08 Sodium 140, potassium 4.9, chloride 111, bicarb 14, anion gap 20, BUN 88, creatinine 5.4, glucose 247 Lactic acid 3.5 >2.1 after IV fluid LFTs normal A1c 5.5 BNP significantly elevated at 38,600 D-dimer elevated at 3393 Troponin 0.10 >0.26 >0.69 >9.5.  EKG with LBBB, no recent tracing for comparison. FOBT negative Blood cultures pending  Chest x-ray showing bilateral patchy opacities concerning for pneumonia. CT chest without contrast showing mild cardiomegaly with moderate bilateral pleural effusions, diffuse interlobular septal thickening and redistribution of the pulmonary vascularity and peribronchovascular cuffing.  Findings favored to reflect a combination of CHF and pulmonary edema.  Superimposed multifocal pneumonia also a  consideration given multifocal regions of peripheral consolidation.  Also showing mild thoracic esophageal mural thickening, outpatient endoscopy recommended for further evaluation.  Extensive coronary artery calcifications.  Aortic atherosclerosis.  Cholelithiasis without findings of acute cholecystitis.  Mild bilateral perinephric stranding.  VQ scan low probability for PE.  Patient was treated with ceftriaxone and azithromycin.  Patient was given 2 L IV fluid per sepsis protocol in the ED.  He was started on bicarb drip for metabolic acidosis.  Also started on heparin infusion  **Interim History Patient had a repeat echocardiogram was found to have acute on chronic combined systolic and diastolic CHF with worsening EF and worsening diastolic dysfunction.  Cardiology is consulted for further evaluation recommendations.  Patient's renal function and he has an AKI on CKD stage IV.  Nephrology is aggressively diuresing with 100 mg of IV Lasix every 6.  Assessment & Plan:   Principal Problem:   Pulmonary edema Active Problems:   Essential hypertension   Chronic kidney disease (CKD), stage IV (severe) (HCC)   CHF exacerbation (HCC)   AKI (acute kidney injury) ([place])  Acute Hypoxemic Respiratory Failure with Pulmonary Edema in the setting of acute on chronic combined systolic and diastolic CHF as well as likely due to worsening/progression of CKD in setting of acute illness. Aortic stenosis may have also progressed, moderate to severe on 2019 echocardiogram.  -Initially thought to have CAP, currently on azithromycin (day 3/3) and ceftriaxone (day 3 of 7).  But will stop as his chest x-ray is more consistent with fluid volume overload as he is afebrile and has a minimal leukocytosis -PCT was 0.85 but in the setting of renal failure and less likely infection -For pulmonary  edema, furosemide ordered per Nephrology, appreciate recommendations -COVID negative X2--transfer to alternate unit  - Wean  oxygen goal O2 sat >92% - Incentive spirometry - Follow up blood cultures showed no growth to date at 1 day -Continue diuresis per nephro -SpO2: 100 % O2 Flow Rate (L/min): 2 L/min -Wean O2 as tolerated -Continue supplemental oxygen via nasal cannula and maintain O2 saturations greater than 90% -We will need an ambulatory home O2 screen prior to discharge next-cardiology consulted for further evaluation and he is currently not a candidate for any invasive studies due to risk for contrast-induced nephropathy given his recent worsening of his renal function and cardiology believes that his cardiac catheterization will not likely offer him any significant benefit either  Acute on chronic combined systolic and diastolic CHF; worsened -Likely contributed heavily to the patient's shortness of breath and acute pulmonary edema -EF is down to 20% and echo showed worsening grade 2 diastolic dysfunction next-also has aortic stenosis  -Echo results as below -Cardiology recommending that he is not a candidate for any ACE inhibitors or ARB therapy or well spironolactone given his renal function -His carvedilol was held twice and he takes BiDil 20-7.5 mg 3 times daily -Cardiology recommending that when his heart rate becomes more stable consider adding back his carvedilol with caution not to drop his blood pressure -Further management per cardiology -Strict I's and O's, daily weights -Not really made much urine but is currently -380 mL since admission  -Currently getting heavily diuresed with 100 mg of IV Lasix every 6 hours scheduled for nephrology -To monitor for signs and symptoms of volume overload and repeat chest x-ray in a.m.  Acute kidney injury on CKD IV Metabolic acidosis -baseline creatinine ~ 4 at last value in system - Nephrology consulted, appreciate recommendations - Follow up renal ultrasound showed Findings of chronic medical renal disease. No evidence of Hydronephrosis. Small  bilateral renal cysts.  No renal masses identified. Diffuse bladder wall thickening, likely due to chronic bladder outlet obstruction given enlarged prostate" -Lytes done to check FeNa -Patient had a metabolic acidosis on admission with a CO2 of 15, chloride level of 106, and an anion gap of 18; repeat this morning shows a CO2 of 17, and anion gap of 17, and a chloride level of 106 Not be accurate given the patient has been on Lasix - Strict I/O, bladder scans - Avoid nephrotoxic agents, contrast dyes, hypotension if possible and renally adjust medications -Patient's urinalysis showed a hazy appearance with straw-colored urine, small hemoglobin, negative nitrites, negative leukocytes negative ketones, rare bacteria, -BUNs/creatinine from 106/5.1 211/5.94 and is now 121/5.80  -Continue to monitor and trend renal function and further care per nephrology as he is getting diuresed with 100 mg of Lasix IV every 6 scheduled  NSTEMI type II, likely due to demand ischemia in setting of acute illness as well as recent acute on chronic combined CHF, AKI  - Denies chest pain - Echocardiogram ordered and is below -Cardiology formally consulted for further evaluation - Telemetry -Patient's high-sensitivity troponin was 12,172 on admission and repeat was 11,574 - Repeat EKG ordered - Restarted ASA and statin  - Continue heparin drip for now   Aortic stenosis -Caution with excess diuresis/fluids given moderate to severe -Echocardiogram as below -Cardiology consulted for further evaluation  HLD - Pravastatin  HTN - Hold Carvedilol and BiDil for now given his lower blood pressures  CAD Patient denies any prior stent placement---continue ASA   Normocytic anemia/anemia of chronic kidney disease -Iron  studies was done and showed a iron level of 40, U IBC 173, TIBC of 213, saturation ratio is 19%, ferritin of 118 -Patient's hemoglobin/hematocrit is now 9.2/27.8  -Continue to monitor for signs  and symptoms of bleeding as he is now anticoagulated with a heparin drip -Repeat CBC in a.m.  Leukocytosis -Likely reactive in the setting of above next-currently do not clinically feel has any signs of infection -Continue monitor and trend and repeat CBC in a.m.  Thrombocytopenia -Patient's Platelet Count went from 114 -> 112 -Continue to Monitor for S/Sx of Bleeding -Repeat CBC in AM   DVT prophylaxis: Anticoagulated with a heparin drip Code Status: FULL CODE  Family Communication: Discussed with Daughter Engineer, manufacturing systems) via the Telephone Disposition Plan: Patient from home and will need PT OT evaluation as well as cardiac and nephrology clearance for a safe discharge disposition  Consultants:   Nephrology  Cardiology   Procedures:  ECHOCARDIOGRAM IMPRESSIONS    1. Left ventricular ejection fraction, by visual estimation, is <20%. The  left ventricle has severely decreased function. There is no left  ventricular hypertrophy.  2. Definity contrast agent was given IV to delineate the left ventricular  endocardial borders.  3. Elevated left atrial pressure.  4. Left ventricular diastolic parameters are consistent with Grade II  diastolic dysfunction (pseudonormalization).  5. Moderately dilated left ventricular internal cavity size.  6. The left ventricle demonstrates global hypokinesis.  7. Global right ventricle has moderately reduced systolic function.The  right ventricular size is mildly enlarged. No increase in right  ventricular wall thickness.  8. Left atrial size was severely dilated.  9. Right atrial size was normal.  10. The mitral valve is abnormal. Mild mitral valve regurgitation.  11. The tricuspid valve is grossly normal.  12. The tricuspid valve is grossly normal. Tricuspid valve regurgitation  is mild.  13. The aortic valve is abnormal. Aortic valve regurgitation is not  visualized.  14. AV is thickence, calcified with restricted motion Peak and mean    gradients through the valve are 24 and 15 mm Hg respectively LVOT/AV VTI  0.35 consistent with moderate AS.  15. The pulmonic valve was not well visualized. Pulmonic valve  regurgitation is not visualized.  16. The inferior vena cava is dilated in size with >50% respiratory  variability, suggesting right atrial pressure of 8 mmHg.  17. The interatrial septum was not well visualized.   FINDINGS  Left Ventricle: Left ventricular ejection fraction, by visual estimation,  is <20%. The left ventricle has severely decreased function. Definity  contrast agent was given IV to delineate the left ventricular endocardial  borders. The left ventricle  demonstrates global hypokinesis. The left ventricular internal cavity size  was moderately dilated left ventricle. There is no left ventricular  hypertrophy. Left ventricular diastolic parameters are consistent with  Grade II diastolic dysfunction  (pseudonormalization). Elevated left atrial pressure.   Right Ventricle: The right ventricular size is mildly enlarged. No  increase in right ventricular wall thickness. Global RV systolic function  is has moderately reduced systolic function.   Left Atrium: Left atrial size was severely dilated.   Right Atrium: Right atrial size was normal in size   Pericardium: There is no evidence of pericardial effusion.   Mitral Valve: The mitral valve is abnormal. There is mild thickening of  the mitral valve leaflet(s). Mild mitral valve regurgitation.   Tricuspid Valve: The tricuspid valve is grossly normal. Tricuspid valve  regurgitation is mild.   Aortic Valve: The aortic valve is  abnormal. Aortic valve regurgitation is  not visualized. Aortic valve mean gradient measures 15.0 mmHg. Aortic  valve peak gradient measures 24.2 mmHg. Aortic valve area, by VTI measures  1.09 cm. AV is thickence,  calcified with restricted motion Peak and mean gradients through the valve  are 24 and 15 mm Hg respectively  LVOT/AV VTI 0.35 consistent with moderate  AS.   Pulmonic Valve: The pulmonic valve was not well visualized. Pulmonic valve  regurgitation is not visualized. Pulmonic regurgitation is not visualized.   Aorta: The aortic root and ascending aorta are structurally normal, with  no evidence of dilitation.   Venous: The inferior vena cava is dilated in size with greater than 50%  respiratory variability, suggesting right atrial pressure of 8 mmHg.   IAS/Shunts: The interatrial septum was not well visualized.     LEFT VENTRICLE  PLAX 2D  LVIDd:     6.00 cm Diastology  LVIDs:     5.20 cm LV e' lateral:  6.74 cm/s  LV PW:     1.10 cm LV E/e' lateral: 13.7  LV IVS:    1.00 cm LV e' medial:  3.15 cm/s  LVOT diam:   2.00 cm LV E/e' medial: 29.2  LV SV:     50 ml  LV SV Index:  28.21  LVOT Area:   3.14 cm     RIGHT VENTRICLE  RV S prime:   7.94 cm/s  TAPSE (M-mode): 1.5 cm   LEFT ATRIUM       Index    RIGHT ATRIUM      Index  LA diam:    4.00 cm 2.20 cm/m RA Area:   12.00 cm  LA Vol (A2C):  83.6 ml 46.07 ml/m RA Volume:  25.40 ml 14.00 ml/m  LA Vol (A4C):  89.8 ml 49.48 ml/m  LA Biplane Vol: 86.6 ml 47.72 ml/m  AORTIC VALVE  AV Area (Vmax):  1.20 cm  AV Area (Vmean):  1.20 cm  AV Area (VTI):   1.09 cm  AV Vmax:      246.00 cm/s  AV Vmean:     183.000 cm/s  AV VTI:      0.526 m  AV Peak Grad:   24.2 mmHg  AV Mean Grad:   15.0 mmHg  LVOT Vmax:     93.80 cm/s  LVOT Vmean:    69.800 cm/s  LVOT VTI:     0.183 m  LVOT/AV VTI ratio: 0.35    AORTA  Ao Root diam: 3.30 cm   MITRAL VALVE  MV Area (PHT): 3.65 cm       SHUNTS  MV PHT:    60.32 msec      Systemic VTI: 0.18 m  MV Decel Time: 208 msec       Systemic Diam: 2.00 cm  MV E velocity: 92.10 cm/s 103 cm/s  MV A velocity: 74.60 cm/s 70.3 cm/s  MV E/A ratio: 1.23    1.5     Antimicrobials:  Anti-infectives (From admission, onward)   Start     Dose/Rate Route Frequency Ordered Stop   09/16/19 2200  azithromycin (ZITHROMAX) 500 mg in sodium chloride 0.9 % 250 mL IVPB  Status:  Discontinued    Note to Pharmacy: Last dose at [facility] 1/30 ~0100   500 mg 250 mL/hr over 60 Minutes Intravenous Every 24 hours 09/16/19 0458 09/17/19 1632   09/16/19 0500  cefTRIAXone (ROCEPHIN) 1 g in sodium chloride 0.9 % 100 mL IVPB  Status:  Discontinued    Note to Pharmacy: Due for dose now, last dose at [facility] 1/29 ~0130   1 g 200 mL/hr over 30 Minutes Intravenous Every 24 hours 09/16/19 0458 09/17/19 1632     Subjective: Seen And examined at bedside and states that his shortness of breath is doing little bit better.  No chest pain, lightheadedness or dizziness.  States he hasn't been urinating very much. Still has a lot of swelling in his legs.  No other concerns or complaints at this time.  Objective: Vitals:   09/16/19 2021 09/16/19 2358 09/17/19 0548 09/17/19 1523  BP: 109/78 107/69 117/90 (!) 128/91  Pulse: 64 89 82 63  Resp: 18 18 18 20   Temp: 97.9 F (36.6 C) 97.9 F (36.6 C) 97.8 F (36.6 C) 98 F (36.7 C)  TempSrc: Oral Oral Oral Oral  SpO2: 98% 100% 100% 100%  Weight:      Height:        Intake/Output Summary (Last 24 hours) at 09/17/2019 1647 Last data filed at 09/17/2019 1408 Gross per 24 hour  Intake 700.35 ml  Output 1350 ml  Net -649.65 ml   Filed Weights   09/16/19 0308 09/16/19 1128 09/16/19 1459  Weight: 64.8 kg 65 kg 65.3 kg   Examination: Physical Exam:  Constitutional: Thin elderly frail Caucasian male currently in NAD and appears calm  Eyes: Lids and conjunctivae normal, sclerae anicteric  ENMT: External Ears, Nose appear normal. Grossly normal hearing. MMM Neck: Appears normal, supple, no cervical masses, normal ROM, no appreciable thyromegaly; no JVD Respiratory: Diminished to auscultation bilaterally with coarse breath sounds  and some crackles; No appreciable no wheezing, rales, rhonchi.  Has a slightly increased respiratory rate and he is wearing supplemental oxygen via nasal cannula Cardiovascular: RRR, no murmurs / rubs / gallops. S1 and S2 auscultated. 1-2+ LE extremity edema.  Abdomen: Soft, non-tender, non-distended. Bowel sounds positive x4.  GU: Deferred. Musculoskeletal: No clubbing / cyanosis of digits/nails. No joint deformity upper and lower extremities.  Skin: No rashes, lesions, ulcers on a limited skin evaluation. No induration; Warm and dry.  Neurologic: CN 2-12 grossly intact with no focal deficits. Romberg sign and cerebellar reflexes not assessed.  Psychiatric: Normal judgment and insight. Alert and oriented x 3. Normal mood and appropriate affect.   Data Reviewed: I have personally reviewed following labs and imaging studies  CBC: Recent Labs  Lab 09/16/19 0517 09/17/19 0538  WBC 12.0* 11.8*  HGB 9.5* 9.2*  HCT 29.4* 27.8*  MCV 98.3 97.5  PLT 114* [id number]*   Basic Metabolic Panel: Recent Labs  Lab 09/16/19 0517 09/16/19 1352 09/17/19 0538  NA 139 139 140  K 5.0 4.8 4.3  CL 105 106 106  CO2 15* 15* 17*  GLUCOSE 128* 119* 129*  BUN 106* 111* 121*  CREATININE 5.81* 5.94* 5.80*  CALCIUM 8.2* 8.1* 8.0*  PHOS  --  8.4* 7.8*   GFR: Estimated Creatinine Clearance: 8.1 mL/min (A) (by C-G formula based on SCr of 5.8 mg/dL (H)). Liver Function Tests: Recent Labs  Lab 09/16/19 0517 09/16/19 1352 09/17/19 0538  AST 56*  --   --   ALT 36  --   --   ALKPHOS 44  --   --   BILITOT 0.7  --   --   PROT 6.2*  --   --   ALBUMIN 3.3* 3.3* 3.2*   No results for input(s): LIPASE, AMYLASE in the last 168 hours. No results for input(s): AMMONIA in the  last 168 hours. Coagulation Profile: No results for input(s): INR, PROTIME in the last 168 hours. Cardiac Enzymes: No results for input(s): CKTOTAL, CKMB, CKMBINDEX, TROPONINI in the last 168 hours. BNP (last 3 results) No results for  input(s): PROBNP in the last 8760 hours. HbA1C: No results for input(s): HGBA1C in the last 72 hours. CBG: No results for input(s): GLUCAP in the last 168 hours. Lipid Profile: No results for input(s): CHOL, HDL, LDLCALC, TRIG, CHOLHDL, LDLDIRECT in the last 72 hours. Thyroid Function Tests: No results for input(s): TSH, T4TOTAL, FREET4, T3FREE, THYROIDAB in the last 72 hours. Anemia Panel: Recent Labs    09/17/19 0538  FERRITIN 118  TIBC 213*  IRON 40*   Sepsis Labs: Recent Labs  Lab 09/16/19 0517  PROCALCITON 0.85    Recent Results (from the past 240 hour(s))  SARS CORONAVIRUS 2 (TAT 6-24 HRS) Nasopharyngeal Nasopharyngeal Swab     Status: None   Collection Time: 09/16/19  4:13 AM   Specimen: Nasopharyngeal Swab  Result Value Ref Range Status   SARS Coronavirus 2 NEGATIVE NEGATIVE Final    Comment: (NOTE) SARS-CoV-2 target nucleic acids are NOT DETECTED. The SARS-CoV-2 RNA is generally detectable in upper and lower respiratory specimens during the acute phase of infection. Negative results do not preclude SARS-CoV-2 infection, do not rule out co-infections with other pathogens, and should not be used as the sole basis for treatment or other patient management decisions. Negative results must be combined with clinical observations, patient history, and epidemiological information. The expected result is Negative. Fact Sheet for Patients: SugarRoll.be Fact Sheet for Healthcare Providers: https://www.woods-mathews.com/ This test is not yet approved or cleared by the [place] FDA and  has been authorized for detection and/or diagnosis of SARS-CoV-2 by FDA under an Emergency Use Authorization (EUA). This EUA will remain  in effect (meaning this test can be used) for the duration of the COVID-19 declaration under Section 56 4(b)(1) of the Act, 21 U.S.C. section 360bbb-3(b)(1), unless the authorization is terminated or revoked  sooner. Performed at [facility], [place] [address], [place], [place] [address]   Culture, blood (routine x 2)     Status: None (Preliminary result)   Collection Time: 09/16/19  5:20 AM   Specimen: BLOOD  Result Value Ref Range Status   Specimen Description BLOOD LEFT HAND  Final   Special Requests   Final    BOTTLES DRAWN AEROBIC ONLY Blood Culture results may not be optimal due to an inadequate volume of blood received in culture bottles   Culture   Final    NO GROWTH 1 DAY Performed at [facility], [place] [address], [place], [place] [address]    Report Status PENDING  Incomplete  Culture, blood (routine x 2)     Status: None (Preliminary result)   Collection Time: 09/16/19  5:26 AM   Specimen: BLOOD  Result Value Ref Range Status   Specimen Description BLOOD LEFT HAND  Final   Special Requests   Final    BOTTLES DRAWN AEROBIC ONLY Blood Culture adequate volume   Culture   Final    NO GROWTH 1 DAY Performed at [facility], [place] [address], [place], [place] [address]    Report Status PENDING  Incomplete     RN Pressure Injury Documentation:     Estimated body mass index is 20.65 kg/m as calculated from the following:   Height as of this encounter: 5\' 10"  (1.778 m).   Weight  as of this encounter: 65.3 kg.  Malnutrition Type:  Nutrition Problem: Increased nutrient needs Etiology: chronic illness, acute illness(CKD IV, CHF (EF 35-40%) admitted with pulmonary edema)   Malnutrition Characteristics:  Signs/Symptoms: estimated needs   Nutrition Interventions:  Interventions: MVI, Nepro shake   Radiology Studies: DG Chest 1 View  Result Date: 09/16/2019 CLINICAL DATA:  Shortness of breath. EXAM: CHEST  1 VIEW COMPARISON:  Chest radiograph 09/15/2019 FINDINGS: Monitoring leads overlie the patient. Stable cardiac and mediastinal contours. Aortic atherosclerosis. Diffuse bilateral interstitial pulmonary opacities. No pleural effusion or pneumothorax.  IMPRESSION: Diffuse bilateral interstitial pulmonary opacities may represent edema or atypical infection. Electronically Signed   By: Lovey Newcomer M.D.   On: 09/16/2019 09:35   US RENAL  Result Date: 09/16/2019 CLINICAL DATA:  Acute renal failure superimposed on stage IV chronic kidney disease. Congestive heart failure and coronary artery disease. EXAM: RENAL / URINARY TRACT ULTRASOUND COMPLETE COMPARISON:  None. FINDINGS: Right Kidney: Renal measurements: 8.4 x 5.3 x 4.6 cm = volume: 107 mL. Diffuse renal parenchymal thinning and increased echogenicity. A few small cysts are seen, largest measuring 1.9 cm. No mass or hydronephrosis visualized. Left Kidney: Renal measurements: 9.0 x 5.9 x 4.8 cm = volume: 134 mL. Diffuse renal parenchymal thinning and increased echogenicity. Several small cysts are seen, largest measuring 5.5 cm. No mass or hydronephrosis visualized. Bladder: Mild diffuse bladder wall thickening and enlarged prostate gland noted, likely due to chronic bladder outlet obstruction. Other: None. IMPRESSION: Findings of chronic medical renal disease. No evidence of hydronephrosis. Small bilateral renal cysts.  No renal masses identified. Diffuse bladder wall thickening, likely due to chronic bladder outlet obstruction given enlarged prostate. Electronically Signed   By: Marlaine Hind M.D.   On: 09/16/2019 14:03   ECHOCARDIOGRAM COMPLETE  Result Date: 09/17/2019   ECHOCARDIOGRAM REPORT   Patient Name:   MARKEL WACHHOLZ Date of Exam: 09/17/2019 Medical Rec #:  [id number]      Height:       70.0 in Accession #:    [id number]     Weight:       143.9 lb Date of Birth:  July 15, 1929     BSA:          1.81 m Patient Age:    66 years       BP:           117/90 mmHg Patient Gender: M              HR:           82 bpm. Exam Location:  Inpatient Procedure: 2D Echo Indications:    CHF 428  History:        Patient has prior history of Echocardiogram examinations, most                 recent 03/23/2018. CHF, CAD; Risk  Factors:Hypertension and                 Dyslipidemia.  Sonographer:    Jannett Celestine RDCS (AE) Referring Phys: [id number] VASUNDHRA [place]  1. Left ventricular ejection fraction, by visual estimation, is <20%. The left ventricle has severely decreased function. There is no left ventricular hypertrophy.  2. Definity contrast agent was given IV to delineate the left ventricular endocardial borders.  3. Elevated left atrial pressure.  4. Left ventricular diastolic parameters are consistent with Grade II diastolic dysfunction (pseudonormalization).  5. Moderately dilated left ventricular internal cavity size.  6. The left ventricle demonstrates  global hypokinesis.  7. Global right ventricle has moderately reduced systolic function.The right ventricular size is mildly enlarged. No increase in right ventricular wall thickness.  8. Left atrial size was severely dilated.  9. Right atrial size was normal. 10. The mitral valve is abnormal. Mild mitral valve regurgitation. 11. The tricuspid valve is grossly normal. 12. The tricuspid valve is grossly normal. Tricuspid valve regurgitation is mild. 13. The aortic valve is abnormal. Aortic valve regurgitation is not visualized. 14. AV is thickence, calcified with restricted motion Peak and mean gradients through the valve are 24 and 15 mm Hg respectively LVOT/AV VTI 0.35 consistent with moderate AS. 15. The pulmonic valve was not well visualized. Pulmonic valve regurgitation is not visualized. 16. The inferior vena cava is dilated in size with >50% respiratory variability, suggesting right atrial pressure of 8 mmHg. 17. The interatrial septum was not well visualized. FINDINGS  Left Ventricle: Left ventricular ejection fraction, by visual estimation, is <20%. The left ventricle has severely decreased function. Definity contrast agent was given IV to delineate the left ventricular endocardial borders. The left ventricle demonstrates global hypokinesis. The left  ventricular internal cavity size was moderately dilated left ventricle. There is no left ventricular hypertrophy. Left ventricular diastolic parameters are consistent with Grade II diastolic dysfunction (pseudonormalization). Elevated left atrial pressure. Right Ventricle: The right ventricular size is mildly enlarged. No increase in right ventricular wall thickness. Global RV systolic function is has moderately reduced systolic function. Left Atrium: Left atrial size was severely dilated. Right Atrium: Right atrial size was normal in size Pericardium: There is no evidence of pericardial effusion. Mitral Valve: The mitral valve is abnormal. There is mild thickening of the mitral valve leaflet(s). Mild mitral valve regurgitation. Tricuspid Valve: The tricuspid valve is grossly normal. Tricuspid valve regurgitation is mild. Aortic Valve: The aortic valve is abnormal. Aortic valve regurgitation is not visualized. Aortic valve mean gradient measures 15.0 mmHg. Aortic valve peak gradient measures 24.2 mmHg. Aortic valve area, by VTI measures 1.09 cm. AV is thickence, calcified with restricted motion Peak and mean gradients through the valve are 24 and 15 mm Hg respectively LVOT/AV VTI 0.35 consistent with moderate AS. Pulmonic Valve: The pulmonic valve was not well visualized. Pulmonic valve regurgitation is not visualized. Pulmonic regurgitation is not visualized. Aorta: The aortic root and ascending aorta are structurally normal, with no evidence of dilitation. Venous: The inferior vena cava is dilated in size with greater than 50% respiratory variability, suggesting right atrial pressure of 8 mmHg. IAS/Shunts: The interatrial septum was not well visualized.  LEFT VENTRICLE PLAX 2D LVIDd:         6.00 cm  Diastology LVIDs:         5.20 cm  LV e' lateral:   6.74 cm/s LV PW:         1.10 cm  LV E/e' lateral: 13.7 LV IVS:        1.00 cm  LV e' medial:    3.15 cm/s LVOT diam:     2.00 cm  LV E/e' medial:  29.2 LV SV:          50 ml LV SV Index:   28.21 LVOT Area:     3.14 cm  RIGHT VENTRICLE RV S prime:     7.94 cm/s TAPSE (M-mode): 1.5 cm LEFT ATRIUM             Index       RIGHT ATRIUM  Index LA diam:        4.00 cm 2.20 cm/m  RA Area:     12.00 cm LA Vol (A2C):   83.6 ml 46.07 ml/m RA Volume:   25.40 ml  14.00 ml/m LA Vol (A4C):   89.8 ml 49.48 ml/m LA Biplane Vol: 86.6 ml 47.72 ml/m  AORTIC VALVE AV Area (Vmax):    1.20 cm AV Area (Vmean):   1.20 cm AV Area (VTI):     1.09 cm AV Vmax:           246.00 cm/s AV Vmean:          183.000 cm/s AV VTI:            0.526 m AV Peak Grad:      24.2 mmHg AV Mean Grad:      15.0 mmHg LVOT Vmax:         93.80 cm/s LVOT Vmean:        69.800 cm/s LVOT VTI:          0.183 m LVOT/AV VTI ratio: 0.35  AORTA Ao Root diam: 3.30 cm MITRAL VALVE MV Area (PHT): 3.65 cm             SHUNTS MV PHT:        60.32 msec           Systemic VTI:  0.18 m MV Decel Time: 208 msec             Systemic Diam: 2.00 cm MV E velocity: 92.10 cm/s 103 cm/s MV A velocity: 74.60 cm/s 70.3 cm/s MV E/A ratio:  1.23       1.5  Dorris Carnes MD Electronically signed by Dorris Carnes MD Signature Date/Time: 09/17/2019/11:15:27 AM    Final    VAS [place] LOWER EXTREMITY VENOUS (DVT)  Result Date: 09/17/2019  Lower Venous Study Indications: SOB.  Risk Factors: CKD stage IV. Comparison Study: No prior study on file Performing Technologist: Sharion Dove RVS  Examination Guidelines: A complete evaluation includes B-mode imaging, spectral Doppler, color Doppler, and power Doppler as needed of all accessible portions of each vessel. Bilateral testing is considered an integral part of a complete examination. Limited examinations for reoccurring indications may be performed as noted.  +---------+---------------+---------+-----------+----------+--------------+  RIGHT     Compressibility Phasicity Spontaneity Properties Thrombus Aging  +---------+---------------+---------+-----------+----------+--------------+  CFV       Full             Yes       Yes                                    +---------+---------------+---------+-----------+----------+--------------+  SFJ       Full                                                             +---------+---------------+---------+-----------+----------+--------------+  FV Prox   Full                                                             +---------+---------------+---------+-----------+----------+--------------+  FV Mid    Full                                                             +---------+---------------+---------+-----------+----------+--------------+  FV Distal Full                                                             +---------+---------------+---------+-----------+----------+--------------+  PFV       Full                                                             +---------+---------------+---------+-----------+----------+--------------+  POP       Full            Yes       Yes                                    +---------+---------------+---------+-----------+----------+--------------+  PTV       Full                                                             +---------+---------------+---------+-----------+----------+--------------+  PERO      Full                                                             +---------+---------------+---------+-----------+----------+--------------+   +---------+---------------+---------+-----------+----------+--------------+  LEFT      Compressibility Phasicity Spontaneity Properties Thrombus Aging  +---------+---------------+---------+-----------+----------+--------------+  CFV       Full            Yes       Yes                                    +---------+---------------+---------+-----------+----------+--------------+  SFJ       Full                                                             +---------+---------------+---------+-----------+----------+--------------+  FV Prox   Full                                                              +---------+---------------+---------+-----------+----------+--------------+  FV Mid    Full                                                             +---------+---------------+---------+-----------+----------+--------------+  FV Distal Full                                                             +---------+---------------+---------+-----------+----------+--------------+  PFV       Full                                                             +---------+---------------+---------+-----------+----------+--------------+  POP       Full            Yes       Yes                                    +---------+---------------+---------+-----------+----------+--------------+  PTV       Full                                                             +---------+---------------+---------+-----------+----------+--------------+  PERO      Full                                                             +---------+---------------+---------+-----------+----------+--------------+     Summary: Right: There is no evidence of deep vein thrombosis in the lower extremity. Pulsatile waveforms noted, suggestive of fluid overload Left: There is no evidence of deep vein thrombosis in the lower extremity. Pulsatile waveforms noted, suggestive of fluid overload  *See table(s) above for measurements and observations. Electronically signed by Servando Snare MD on 09/17/2019 at 10:54:43 AM.    Final    Scheduled Meds:  aspirin EC  81 mg Oral Daily   B-complex with vitamin C  1 tablet Oral Daily   feeding supplement (NEPRO CARB STEADY)  237 mL Oral Q24H   perflutren lipid microspheres (DEFINITY) IV suspension       pravastatin  40 mg Oral q1800   Continuous Infusions:  furosemide 100 mg (09/17/19 1249)   heparin 950 Units/hr (09/17/19 1252)    LOS: 1 day   Kerney Elbe, DO [facility] PAGER is on AMION  If 7PM-7AM, please contact night-coverage [email or web address]

## 2019-09-17 NOTE — Consult Note (Addendum)
Cardiology Consultation:   Patient ID: Tanner Pearson MRN: [id number]; DOB: 1929/02/02  Admit date: 09/16/2019 Date of Consult: 09/17/2019  Primary Care Provider: Ernestene Kiel, MD Primary Cardiologist: Jenne Campus, MD  Primary Electrophysiologist:  None    Patient Profile:   Tanner Pearson is a 86 y.o. male with a hx of CAD, ischemic cardiomyopathy, hypertension, hyperlipidemia, and stage IV-V CKD who is being seen today for the evaluation of worsening LV dysfunction at the request of Dr. Alfredia Ferguson.  History of Present Illness:   Tanner Pearson is 86 year old male with past medical history of CAD, ischemic cardiomyopathy, hypertension, hyperlipidemia, and stage IV-V CKD.  Previous echocardiogram obtained on 03/23/2018 showed EF 35 to 40%, grade 1 DD, moderate to severe AI, mild MR.  Patient was most recently seen by Dr. Agustin Cree via virtual visit on 07/10/2019, a repeat echocardiogram was recommended.   He presented to the hospital on 09/16/2019 with increasing shortness of breath for 3 days.  He initially went to the [facility].  Lactic acid 3.5.  BNP significantly elevated at 38,600, D-dimer elevated at 3393.  Serial troponin I 0.26 --> 0.69 -->9.5. VQ scan showed low probability for PE.  CT of the chest performed on 09/15/2019 demonstrated mild cardiomegaly with moderate bilateral pleural effusion, diffuse interlobar septal thickening and the redistribution of pulmonary vascularity and peribronchovascular cuffing, this likely reflects combination of congestive heart failure and pulmonary edema.  He was treated with ceftriaxone and azithromycin along with IV fluid.  Patient was later transferred to [facility] for further management.  On arrival, his creatinine was 5.8.  Troponin was elevated 12,172, pro calcitonin 0.85, white blood cell count 12, hemoglobin 9.5.  Subsequent high-sensitivity troponin remained flat at 11,574.  Due to elevation of the troponin, the case was  discussed with Dr. Ellyn Hack, one of our interventional cardiologist on-call overnight who felt this is not a STEMI instead this is likely demand ischemia in the setting of acute on chronic renal insufficiency and heart failure.  Chest x-ray showed a diffuse bilateral interstitial pulmonary opacities suggestive of edema.  Lower extremity venous Doppler showed no DVT however does reveal pulsatile waveform suggestive of volume overload.  Echocardiogram was repeated on 09/17/2019 which revealed EF is now less than 20%, grade 2 DD, mild MR, severe LAE, moderate aortic stenosis.  Patient is currently being followed by nephrology service who is attempting slow diuresis.  He is on Lasix drip 100 mg every 6 hours and IV heparin.   Heart Pathway Score:     Past Medical History:  Diagnosis Date   Acute on chronic combined systolic (congestive) and diastolic (congestive) heart failure (HCC)    CAD (coronary artery disease)    Chronic kidney disease (CKD), stage IV (severe) (HCC)    Dyslipidemia    Hyperlipidemia    Hypertension     Past Surgical History:  Procedure Laterality Date   AV FISTULA PLACEMENT, RADIOCEPHALIC     CATARACT EXTRACTION       Home Medications:  Prior to Admission medications   Medication Sig Start Date End Date Taking? Authorizing Provider  allopurinol (ZYLOPRIM) 100 MG tablet Take 100 mg by mouth daily.   Yes [provider]  aspirin EC 81 MG tablet Take 81 mg by mouth daily.   Yes [provider]  carvedilol (COREG) 25 MG tablet Take 1 tablet (25 mg total) by mouth 2 (two) times daily. Patient taking differently: Take 25 mg by mouth at bedtime.  09/05/19  Yes  Park Liter, MD  furosemide (LASIX) 40 MG tablet Take 40 mg by mouth daily. 09/06/19  Yes [provider]  isosorbide-hydrALAZINE (BIDIL) 20-37.5 MG tablet Take 1 tablet by mouth 3 (three) times daily. 09/05/19  Yes Park Liter, MD  pravastatin (PRAVACHOL) 40 MG tablet Take 1  tablet (40 mg total) by mouth at bedtime. 09/05/19  Yes Park Liter, MD    Inpatient Medications: Scheduled Meds:  aspirin EC  81 mg Oral Daily   B-complex with vitamin C  1 tablet Oral Daily   feeding supplement (NEPRO CARB STEADY)  237 mL Oral Q24H   perflutren lipid microspheres (DEFINITY) IV suspension       pravastatin  40 mg Oral q1800   Continuous Infusions:  furosemide 100 mg (09/17/19 1655)   heparin 950 Units/hr (09/17/19 1252)   PRN Meds: acetaminophen **OR** acetaminophen  Allergies:   No Known Allergies  Social History:   Social History   Socioeconomic History   Marital status: Married    Spouse name: Not on file   Number of children: Not on file   Years of education: Not on file   Highest education level: Not on file  Occupational History   Not on file  Tobacco Use   Smoking status: Former Smoker   Smokeless tobacco: Never Used  Substance and Sexual Activity   Alcohol use: Yes    Comment: occasional beer   Drug use: No   Sexual activity: Not on file  Other Topics Concern   Not on file  Social History Narrative   Not on file   Social Determinants of Health   Financial Resource Strain:    Difficulty of Paying Living Expenses: Not on file  Food Insecurity:    Worried About [place] in the Last Year: Not on file   [employer] of Food in the Last Year: Not on file  Transportation Needs:    Lack of Transportation (Medical): Not on file   Lack of Transportation (Non-Medical): Not on file  Physical Activity:    Days of Exercise per Week: Not on file   Minutes of Exercise per Session: Not on file  Stress:    Feeling of Stress : Not on file  Social Connections:    Frequency of Communication with Friends and Family: Not on file   Frequency of Social Gatherings with Friends and Family: Not on file   Attends Religious Services: Not on file   Active Member of Clubs or Organizations: Not on file   Attends  Archivist Meetings: Not on file   Marital Status: Not on file  Intimate Partner Violence:    Fear of Current or Ex-Partner: Not on file   Emotionally Abused: Not on file   Physically Abused: Not on file   Sexually Abused: Not on file    Family History:    Family History  Problem Relation Age of Onset   Polycystic kidney disease Brother    Polycystic kidney disease Sister    Hypertension Mother    Stroke Mother      ROS:  Please see the history of present illness.   All other ROS reviewed and negative.     Physical Exam/Data:   Vitals:   09/16/19 2021 09/16/19 2358 09/17/19 0548 09/17/19 1523  BP: 109/78 107/69 117/90 (!) 128/91  Pulse: 64 89 82 63  Resp: 18 18 18 20   Temp: 97.9 F (36.6 C) 97.9 F (36.6 C) 97.8 F (36.6  C) 98 F (36.7 C)  TempSrc: Oral Oral Oral Oral  SpO2: 98% 100% 100% 100%  Weight:      Height:        Intake/Output Summary (Last 24 hours) at 09/17/2019 1745 Last data filed at 09/17/2019 1732 Gross per 24 hour  Intake 1014.35 ml  Output 1350 ml  Net -335.65 ml   Last 3 Weights 09/16/2019 09/16/2019 09/16/2019  Weight (lbs) 143 lb 14.4 oz 143 lb 4.8 oz 142 lb 13.7 oz  Weight (kg) 65.273 kg 65 kg 64.8 kg     Body mass index is 20.65 kg/m.  General:  Well nourished, well developed, in no acute distress HEENT: normal Lymph: no adenopathy Neck: no JVD Endocrine:  No thryomegaly Vascular: No carotid bruits; FA pulses 2+ bilaterally without bruits  Cardiac:  normal S1, S2; RRR; 3/6 systolic murmur at RUSB Lungs:  clear to auscultation bilaterally, no wheezing, rhonchi or rales  Abd: soft, nontender, no hepatomegaly  Ext: no edema Musculoskeletal:  No deformities, BUE and BLE strength normal and equal Skin: warm and dry  Neuro:  CNs 2-12 intact, no focal abnormalities noted Psych:  Normal affect   EKG:  The EKG was personally reviewed and demonstrates:  EKG obtained at [facility] demonstrated normal sinus rhythm,  left bundle branch block, T wave inversion in the lateral leads with downsloping ST segment   Telemetry:  Telemetry was personally reviewed and demonstrates:  NSR without significant ventricular ectopy  Relevant CV Studies:  Echo 09/17/2019 1. Left ventricular ejection fraction, by visual estimation, is <20%. The  left ventricle has severely decreased function. There is no left  ventricular hypertrophy.  2. Definity contrast agent was given IV to delineate the left ventricular  endocardial borders.  3. Elevated left atrial pressure.  4. Left ventricular diastolic parameters are consistent with Grade II  diastolic dysfunction (pseudonormalization).  5. Moderately dilated left ventricular internal cavity size.  6. The left ventricle demonstrates global hypokinesis.  7. Global right ventricle has moderately reduced systolic function.The  right ventricular size is mildly enlarged. No increase in right  ventricular wall thickness.  8. Left atrial size was severely dilated.  9. Right atrial size was normal.  10. The mitral valve is abnormal. Mild mitral valve regurgitation.  11. The tricuspid valve is grossly normal.  12. The tricuspid valve is grossly normal. Tricuspid valve regurgitation  is mild.  13. The aortic valve is abnormal. Aortic valve regurgitation is not  visualized.  14. AV is thickence, calcified with restricted motion Peak and mean  gradients through the valve are 24 and 15 mm Hg respectively LVOT/AV VTI  0.35 consistent with moderate AS.  15. The pulmonic valve was not well visualized. Pulmonic valve  regurgitation is not visualized.  16. The inferior vena cava is dilated in size with >50% respiratory  variability, suggesting right atrial pressure of 8 mmHg.  17. The interatrial septum was not well visualized.   Laboratory Data:  High Sensitivity Troponin:   Recent Labs  Lab 09/16/19 0517 09/16/19 0956  TROPONINIHS 12,172* 11,574*     Chemistry Recent  Labs  Lab 09/16/19 0517 09/16/19 1352 09/17/19 0538  NA 139 139 140  K 5.0 4.8 4.3  CL 105 106 106  CO2 15* 15* 17*  GLUCOSE 128* 119* 129*  BUN 106* 111* 121*  CREATININE 5.81* 5.94* 5.80*  CALCIUM 8.2* 8.1* 8.0*  GFRNONAA 8* 8* 8*  GFRAA 9* 9* 9*  ANIONGAP 19* 18* 17*  Recent Labs  Lab 09/16/19 0517 09/16/19 1352 09/17/19 0538  PROT 6.2*  --   --   ALBUMIN 3.3* 3.3* 3.2*  AST 56*  --   --   ALT 36  --   --   ALKPHOS 44  --   --   BILITOT 0.7  --   --    Hematology Recent Labs  Lab 09/16/19 0517 09/17/19 0538  WBC 12.0* 11.8*  RBC 2.99* 2.85*  HGB 9.5* 9.2*  HCT 29.4* 27.8*  MCV 98.3 97.5  MCH 31.8 32.3  MCHC 32.3 33.1  RDW 15.0 14.8  PLT 114* 112*   BNPNo results for input(s): BNP, PROBNP in the last 168 hours.  DDimer No results for input(s): DDIMER in the last 168 hours.   Radiology/Studies:  DG Chest 1 View  Result Date: 09/16/2019 CLINICAL DATA:  Shortness of breath. EXAM: CHEST  1 VIEW COMPARISON:  Chest radiograph 09/15/2019 FINDINGS: Monitoring leads overlie the patient. Stable cardiac and mediastinal contours. Aortic atherosclerosis. Diffuse bilateral interstitial pulmonary opacities. No pleural effusion or pneumothorax. IMPRESSION: Diffuse bilateral interstitial pulmonary opacities may represent edema or atypical infection. Electronically Signed   By: Lovey Newcomer M.D.   On: 09/16/2019 09:35   US RENAL  Result Date: 09/16/2019 CLINICAL DATA:  Acute renal failure superimposed on stage IV chronic kidney disease. Congestive heart failure and coronary artery disease. EXAM: RENAL / URINARY TRACT ULTRASOUND COMPLETE COMPARISON:  None. FINDINGS: Right Kidney: Renal measurements: 8.4 x 5.3 x 4.6 cm = volume: 107 mL. Diffuse renal parenchymal thinning and increased echogenicity. A few small cysts are seen, largest measuring 1.9 cm. No mass or hydronephrosis visualized. Left Kidney: Renal measurements: 9.0 x 5.9 x 4.8 cm = volume: 134 mL. Diffuse renal  parenchymal thinning and increased echogenicity. Several small cysts are seen, largest measuring 5.5 cm. No mass or hydronephrosis visualized. Bladder: Mild diffuse bladder wall thickening and enlarged prostate gland noted, likely due to chronic bladder outlet obstruction. Other: None. IMPRESSION: Findings of chronic medical renal disease. No evidence of hydronephrosis. Small bilateral renal cysts.  No renal masses identified. Diffuse bladder wall thickening, likely due to chronic bladder outlet obstruction given enlarged prostate. Electronically Signed   By: Marlaine Hind M.D.   On: 09/16/2019 14:03   ECHOCARDIOGRAM COMPLETE  Result Date: 09/17/2019   ECHOCARDIOGRAM REPORT   Patient Name:   Tanner Pearson Date of Exam: 09/17/2019 Medical Rec #:  [id number]      Height:       70.0 in Accession #:    [id number]     Weight:       143.9 lb Date of Birth:  11/02/1928     BSA:          1.81 m Patient Age:    82 years       BP:           117/90 mmHg Patient Gender: M              HR:           82 bpm. Exam Location:  Inpatient Procedure: 2D Echo Indications:    CHF 428  History:        Patient has prior history of Echocardiogram examinations, most                 recent 03/23/2018. CHF, CAD; Risk Factors:Hypertension and                 Dyslipidemia.  Sonographer:  Jannett Celestine RDCS (AE) Referring Phys: [id number] VASUNDHRA Joy  1. Left ventricular ejection fraction, by visual estimation, is <20%. The left ventricle has severely decreased function. There is no left ventricular hypertrophy.  2. Definity contrast agent was given IV to delineate the left ventricular endocardial borders.  3. Elevated left atrial pressure.  4. Left ventricular diastolic parameters are consistent with Grade II diastolic dysfunction (pseudonormalization).  5. Moderately dilated left ventricular internal cavity size.  6. The left ventricle demonstrates global hypokinesis.  7. Global right ventricle has moderately reduced systolic  function.The right ventricular size is mildly enlarged. No increase in right ventricular wall thickness.  8. Left atrial size was severely dilated.  9. Right atrial size was normal. 10. The mitral valve is abnormal. Mild mitral valve regurgitation. 11. The tricuspid valve is grossly normal. 12. The tricuspid valve is grossly normal. Tricuspid valve regurgitation is mild. 13. The aortic valve is abnormal. Aortic valve regurgitation is not visualized. 14. AV is thickence, calcified with restricted motion Peak and mean gradients through the valve are 24 and 15 mm Hg respectively LVOT/AV VTI 0.35 consistent with moderate AS. 15. The pulmonic valve was not well visualized. Pulmonic valve regurgitation is not visualized. 16. The inferior vena cava is dilated in size with >50% respiratory variability, suggesting right atrial pressure of 8 mmHg. 17. The interatrial septum was not well visualized. FINDINGS  Left Ventricle: Left ventricular ejection fraction, by visual estimation, is <20%. The left ventricle has severely decreased function. Definity contrast agent was given IV to delineate the left ventricular endocardial borders. The left ventricle demonstrates global hypokinesis. The left ventricular internal cavity size was moderately dilated left ventricle. There is no left ventricular hypertrophy. Left ventricular diastolic parameters are consistent with Grade II diastolic dysfunction (pseudonormalization). Elevated left atrial pressure. Right Ventricle: The right ventricular size is mildly enlarged. No increase in right ventricular wall thickness. Global RV systolic function is has moderately reduced systolic function. Left Atrium: Left atrial size was severely dilated. Right Atrium: Right atrial size was normal in size Pericardium: There is no evidence of pericardial effusion. Mitral Valve: The mitral valve is abnormal. There is mild thickening of the mitral valve leaflet(s). Mild mitral valve regurgitation. Tricuspid  Valve: The tricuspid valve is grossly normal. Tricuspid valve regurgitation is mild. Aortic Valve: The aortic valve is abnormal. Aortic valve regurgitation is not visualized. Aortic valve mean gradient measures 15.0 mmHg. Aortic valve peak gradient measures 24.2 mmHg. Aortic valve area, by VTI measures 1.09 cm. AV is thickence, calcified with restricted motion Peak and mean gradients through the valve are 24 and 15 mm Hg respectively LVOT/AV VTI 0.35 consistent with moderate AS. Pulmonic Valve: The pulmonic valve was not well visualized. Pulmonic valve regurgitation is not visualized. Pulmonic regurgitation is not visualized. Aorta: The aortic root and ascending aorta are structurally normal, with no evidence of dilitation. Venous: The inferior vena cava is dilated in size with greater than 50% respiratory variability, suggesting right atrial pressure of 8 mmHg. IAS/Shunts: The interatrial septum was not well visualized.  LEFT VENTRICLE PLAX 2D LVIDd:         6.00 cm  Diastology LVIDs:         5.20 cm  LV e' lateral:   6.74 cm/s LV PW:         1.10 cm  LV E/e' lateral: 13.7 LV IVS:        1.00 cm  LV e' medial:    3.15 cm/s LVOT diam:  2.00 cm  LV E/e' medial:  29.2 LV SV:         50 ml LV SV Index:   28.21 LVOT Area:     3.14 cm  RIGHT VENTRICLE RV S prime:     7.94 cm/s TAPSE (M-mode): 1.5 cm LEFT ATRIUM             Index       RIGHT ATRIUM           Index LA diam:        4.00 cm 2.20 cm/m  RA Area:     12.00 cm LA Vol (A2C):   83.6 ml 46.07 ml/m RA Volume:   25.40 ml  14.00 ml/m LA Vol (A4C):   89.8 ml 49.48 ml/m LA Biplane Vol: 86.6 ml 47.72 ml/m  AORTIC VALVE AV Area (Vmax):    1.20 cm AV Area (Vmean):   1.20 cm AV Area (VTI):     1.09 cm AV Vmax:           246.00 cm/s AV Vmean:          183.000 cm/s AV VTI:            0.526 m AV Peak Grad:      24.2 mmHg AV Mean Grad:      15.0 mmHg LVOT Vmax:         93.80 cm/s LVOT Vmean:        69.800 cm/s LVOT VTI:          0.183 m LVOT/AV VTI ratio: 0.35   AORTA Ao Root diam: 3.30 cm MITRAL VALVE MV Area (PHT): 3.65 cm             SHUNTS MV PHT:        60.32 msec           Systemic VTI:  0.18 m MV Decel Time: 208 msec             Systemic Diam: 2.00 cm MV E velocity: 92.10 cm/s 103 cm/s MV A velocity: 74.60 cm/s 70.3 cm/s MV E/A ratio:  1.23       1.5  Dorris Carnes MD Electronically signed by Dorris Carnes MD Signature Date/Time: 09/17/2019/11:15:27 AM    Final    VAS [place] LOWER EXTREMITY VENOUS (DVT)  Result Date: 09/17/2019  Lower Venous Study Indications: SOB.  Risk Factors: CKD stage IV. Comparison Study: No prior study on file Performing Technologist: Sharion Dove RVS  Examination Guidelines: A complete evaluation includes B-mode imaging, spectral Doppler, color Doppler, and power Doppler as needed of all accessible portions of each vessel. Bilateral testing is considered an integral part of a complete examination. Limited examinations for reoccurring indications may be performed as noted.  +---------+---------------+---------+-----------+----------+--------------+  RIGHT     Compressibility Phasicity Spontaneity Properties Thrombus Aging  +---------+---------------+---------+-----------+----------+--------------+  CFV       Full            Yes       Yes                                    +---------+---------------+---------+-----------+----------+--------------+  SFJ       Full                                                             +---------+---------------+---------+-----------+----------+--------------+  FV Prox   Full                                                             +---------+---------------+---------+-----------+----------+--------------+  FV Mid    Full                                                             +---------+---------------+---------+-----------+----------+--------------+  FV Distal Full                                                             +---------+---------------+---------+-----------+----------+--------------+  PFV        Full                                                             +---------+---------------+---------+-----------+----------+--------------+  POP       Full            Yes       Yes                                    +---------+---------------+---------+-----------+----------+--------------+  PTV       Full                                                             +---------+---------------+---------+-----------+----------+--------------+  PERO      Full                                                             +---------+---------------+---------+-----------+----------+--------------+   +---------+---------------+---------+-----------+----------+--------------+  LEFT      Compressibility Phasicity Spontaneity Properties Thrombus Aging  +---------+---------------+---------+-----------+----------+--------------+  CFV       Full            Yes       Yes                                    +---------+---------------+---------+-----------+----------+--------------+  SFJ       Full                                                             +---------+---------------+---------+-----------+----------+--------------+  FV Prox   Full                                                             +---------+---------------+---------+-----------+----------+--------------+  FV Mid    Full                                                             +---------+---------------+---------+-----------+----------+--------------+  FV Distal Full                                                             +---------+---------------+---------+-----------+----------+--------------+  PFV       Full                                                             +---------+---------------+---------+-----------+----------+--------------+  POP       Full            Yes       Yes                                    +---------+---------------+---------+-----------+----------+--------------+  PTV       Full                                                              +---------+---------------+---------+-----------+----------+--------------+  PERO      Full                                                             +---------+---------------+---------+-----------+----------+--------------+     Summary: Right: There is no evidence of deep vein thrombosis in the lower extremity. Pulsatile waveforms noted, suggestive of fluid overload Left: There is no evidence of deep vein thrombosis in the lower extremity. Pulsatile waveforms noted, suggestive of fluid overload  *See table(s) above for measurements and observations. Electronically signed by Servando Snare MD on 09/17/2019 at 10:54:43 AM.    Final        TIMI Risk Score for Unstable Angina or Non-ST Elevation MI:   The patient's TIMI risk score is 6, which indicates a 41% risk of all cause mortality, new or recurrent myocardial infarction or need for urgent revascularization in the next 14 days.   Assessment and Plan:   1. Acute  on chronic systolic heart failure with worsening EF: EF has dropped from the previous 35% down to 20%.  He is not a candidate for invasive study due to risk of contrast nephropathy.  Cardiac catheterization likely will not offer him significant benefit either.  Therefore, we do not recommend invasive or noninvasive testing in this case.  Diuresis managed by nephrology service given significant renal dysfunction.  -Prior to the hospitalization, he was on carvedilol 25 mg twice daily and BiDil 20-37.5 mg 3 times daily.  -He is not a candidate for ACE inhibitor or ARB or spironolactone given renal dysfunction  -When he is heart rate become more stable, can consider adding back low-dose carvedilol.  Will need to be careful as not to drop his blood pressure and the renal perfusion pressure.  2. Acute on chronic renal insufficiency: Arrived with creatinine of 5.8.  Currently followed by nephrology service  3. CAD with elevated troponin: EKG unchanged, but does not ST  downsloping in lateral leads and LBBB. He has significant native vessel CAD. Patient denies any recent chest discomfort.  Serial troponin elevated however remained flat on repeat lab work.  Suspect that this is demand ischemia that is worsened due to underlying CAD.  He would not be a good candidate for ischemic work-up due to high risk of contrast nephropathy.  4. Ischemic cardiomyopathy: EF less than 20% on recent echocardiogram.  5. Hypertension: restart coreg if BP stable  6. Hyperlipidemia       For questions or updates, please contact [place] Please consult [email or web address] for contact info under     Signed, Almyra Deforest, [place]  09/17/2019 5:45 PM    I have seen, examined the patient, and reviewed the above assessment and plan.  Changes to above are made where necessary.  On exam, RRR.  Elderly and frail but very alert.  His recent decline appears to be driven by renal failure.  No further cardiac intervention is likely to be beneficial.  I did discuss his very poor prognosis with the patient and his son today.  The patient would prefer to continue medical therapy and consideration of dialysis if feasible. Our medical therapy for his CHF is very limited. Nephrology to guide diuresis.  Cardiology to follow along.  Co Sign: Thompson Grayer, MD 09/17/2019 8:50 PM

## 2019-09-18 ENCOUNTER — Inpatient Hospital Stay (HOSPITAL_COMMUNITY): Payer: Medicare Other

## 2019-09-18 DIAGNOSIS — N179 Acute kidney failure, unspecified: Secondary | ICD-10-CM

## 2019-09-18 DIAGNOSIS — I350 Nonrheumatic aortic (valve) stenosis: Secondary | ICD-10-CM

## 2019-09-18 DIAGNOSIS — J810 Acute pulmonary edema: Secondary | ICD-10-CM

## 2019-09-18 DIAGNOSIS — I214 Non-ST elevation (NSTEMI) myocardial infarction: Secondary | ICD-10-CM

## 2019-09-18 DIAGNOSIS — I5043 Acute on chronic combined systolic (congestive) and diastolic (congestive) heart failure: Secondary | ICD-10-CM

## 2019-09-18 LAB — COMPREHENSIVE METABOLIC PANEL
ALT: 21 U/L (ref 0–44)
AST: 21 U/L (ref 15–41)
Albumin: 3.3 g/dL — ABNORMAL LOW (ref 3.5–5.0)
Alkaline Phosphatase: 52 U/L (ref 38–126)
Anion gap: 18 — ABNORMAL HIGH (ref 5–15)
BUN: 136 mg/dL — ABNORMAL HIGH (ref 8–23)
CO2: 16 mmol/L — ABNORMAL LOW (ref 22–32)
Calcium: 8.3 mg/dL — ABNORMAL LOW (ref 8.9–10.3)
Chloride: 107 mmol/L (ref 98–111)
Creatinine, Ser: 5.67 mg/dL — ABNORMAL HIGH (ref 0.61–1.24)
GFR calc Af Amer: 10 mL/min — ABNORMAL LOW (ref >60)
GFR calc non Af Amer: 8 mL/min — ABNORMAL LOW (ref >60)
Glucose, Bld: 133 mg/dL — ABNORMAL HIGH (ref 70–99)
Potassium: 4.3 mmol/L (ref 3.5–5.1)
Sodium: 141 mmol/L (ref 135–145)
Total Bilirubin: 0.3 mg/dL (ref 0.3–1.2)
Total Protein: 6.1 g/dL — ABNORMAL LOW (ref 6.5–8.1)

## 2019-09-18 LAB — CBC WITH DIFFERENTIAL/PLATELET
Abs Immature Granulocytes: 0.05 10*3/uL (ref 0.00–0.07)
Basophils Absolute: 0 10*3/uL (ref 0.0–0.1)
Basophils Relative: 0 %
Eosinophils Absolute: 0.1 10*3/uL (ref 0.0–0.5)
Eosinophils Relative: 1 %
HCT: 29 % — ABNORMAL LOW (ref 39.0–52.0)
Hemoglobin: 9.2 g/dL — ABNORMAL LOW (ref 13.0–17.0)
Immature Granulocytes: 0 %
Lymphocytes Relative: 16 %
Lymphs Abs: 1.8 10*3/uL (ref 0.7–4.0)
MCH: 32.3 pg (ref 26.0–34.0)
MCHC: 31.7 g/dL (ref 30.0–36.0)
MCV: 101.8 fL — ABNORMAL HIGH (ref 80.0–100.0)
Monocytes Absolute: 0.8 10*3/uL (ref 0.1–1.0)
Monocytes Relative: 8 %
Neutro Abs: 8.4 10*3/uL — ABNORMAL HIGH (ref 1.7–7.7)
Neutrophils Relative %: 75 %
Platelets: 117 10*3/uL — ABNORMAL LOW (ref 150–400)
RBC: 2.85 MIL/uL — ABNORMAL LOW (ref 4.22–5.81)
RDW: 14.9 % (ref 11.5–15.5)
WBC: 11.2 10*3/uL — ABNORMAL HIGH (ref 4.0–10.5)
nRBC: 0.2 % (ref 0.0–0.2)

## 2019-09-18 LAB — PHOSPHORUS: Phosphorus: 7.5 mg/dL — ABNORMAL HIGH (ref 2.5–4.6)

## 2019-09-18 LAB — MAGNESIUM: Magnesium: 2 mg/dL (ref 1.7–2.4)

## 2019-09-18 LAB — HEPARIN LEVEL (UNFRACTIONATED): Heparin Unfractionated: 0.55 IU/mL (ref 0.30–0.70)

## 2019-09-18 MED ORDER — HEPARIN SODIUM (PORCINE) 5000 UNIT/ML IJ SOLN
5000.0000 [IU] | Freq: Three times a day (TID) | INTRAMUSCULAR | Status: DC
  Administered 2019-09-18 – 2019-09-21 (×10): 5000 [IU] via SUBCUTANEOUS
  Filled 2019-09-18 (×10): qty 1

## 2019-09-18 MED ORDER — FUROSEMIDE 10 MG/ML IJ SOLN
100.0000 mg | Freq: Three times a day (TID) | INTRAVENOUS | Status: DC
  Administered 2019-09-18 – 2019-09-20 (×5): 100 mg via INTRAVENOUS
  Filled 2019-09-18 (×7): qty 10

## 2019-09-18 MED ORDER — GUAIFENESIN ER 600 MG PO TB12: 600.0000 mg | ORAL_TABLET | Freq: Two times a day (BID) | ORAL | Status: DC | PRN

## 2019-09-18 MED ORDER — ATORVASTATIN CALCIUM 80 MG PO TABS
80.0000 mg | ORAL_TABLET | Freq: Every day | ORAL | Status: DC
  Administered 2019-09-18 – 2019-09-21 (×4): 80 mg via ORAL
  Filled 2019-09-18 (×4): qty 1

## 2019-09-18 MED ORDER — GUAIFENESIN ER 600 MG PO TB12
1200.0000 mg | ORAL_TABLET | Freq: Two times a day (BID) | ORAL | Status: DC
  Administered 2019-09-18 – 2019-09-22 (×8): 1200 mg via ORAL
  Filled 2019-09-18 (×8): qty 2

## 2019-09-18 MED ORDER — CLOPIDOGREL BISULFATE 75 MG PO TABS
75.0000 mg | ORAL_TABLET | Freq: Every day | ORAL | Status: DC
  Administered 2019-09-18 – 2019-09-22 (×5): 75 mg via ORAL
  Filled 2019-09-18 (×5): qty 1

## 2019-09-18 NOTE — Progress Notes (Addendum)
Progress Note  Patient Name: Tanner Pearson Date of Encounter: 09/18/2019  Primary Cardiologist: Jenne Campus, MD  Subjective   Feeling like he is improving. Not quite back to baseline yet but much less SOB. No CP.  Inpatient Medications    Scheduled Meds:  aspirin EC  81 mg Oral Daily   B-complex with vitamin C  1 tablet Oral Daily   feeding supplement (NEPRO CARB STEADY)  237 mL Oral Q24H   pravastatin  40 mg Oral q1800   Continuous Infusions:  furosemide 100 mg (09/18/19 0531)   heparin 950 Units/hr (09/17/19 1252)   PRN Meds: acetaminophen **OR** acetaminophen   Vital Signs    Vitals:   09/17/19 1523 09/17/19 2045 09/18/19 0455 09/18/19 0500  BP: (!) 128/91 110/84 101/81   Pulse: 63 90 82   Resp: 20 18 19    Temp: 98 F (36.7 C) (!) 97.5 F (36.4 C) 97.7 F (36.5 C)   TempSrc: Oral Oral Oral   SpO2: 100% 100% 98%   Weight:    65.8 kg  Height:        Intake/Output Summary (Last 24 hours) at 09/18/2019 0810 Last data filed at 09/18/2019 0501 Gross per 24 hour  Intake 554 ml  Output 2025 ml  Net -1471 ml   Last 3 Weights 09/18/2019 09/16/2019 09/16/2019  Weight (lbs) 145 lb 1 oz 143 lb 14.4 oz 143 lb 4.8 oz  Weight (kg) 65.8 kg 65.273 kg 65 kg     Telemetry    NSR with frequent PVCs, one very brief episode of what appears to be SVT yesterday afternoon (follows same axis as baseline HR)- Personally Reviewed  Physical Exam   GEN: No acute distress.  HEENT: Normocephalic, atraumatic, sclera non-icteric. Neck: No JVD or bruits. Cardiac: RRR, 3/6 SEM heard best at RUSB but also able to be heard over entire precordium, no rubs or gallops.  Radials/DP/PT 1+ and equal bilaterally.  Respiratory: Clear to auscultation bilaterally. Breathing is unlabored. GI: Soft, nontender, non-distended, BS +x 4. MS: no deformity. Extremities: No clubbing or cyanosis. No edema. Distal pedal pulses are 2+ and equal bilaterally. Neuro:  AAOx3. Follows commands. Psych:   Responds to questions appropriately with a normal affect.  Labs    High Sensitivity Troponin:   Recent Labs  Lab 09/16/19 0517 09/16/19 0956  TROPONINIHS 12,172* 11,574*      Cardiac EnzymesNo results for input(s): TROPONINI in the last 168 hours. No results for input(s): TROPIPOC in the last 168 hours.   Chemistry Recent Labs  Lab 09/16/19 0517 09/16/19 0517 09/16/19 1352 09/17/19 0538 09/18/19 0252  NA 139   < > 139 140 141  K 5.0   < > 4.8 4.3 4.3  CL 105   < > 106 106 107  CO2 15*   < > 15* 17* 16*  GLUCOSE 128*   < > 119* 129* 133*  BUN 106*   < > 111* 121* 136*  CREATININE 5.81*   < > 5.94* 5.80* 5.67*  CALCIUM 8.2*   < > 8.1* 8.0* 8.3*  PROT 6.2*  --   --   --  6.1*  ALBUMIN 3.3*   < > 3.3* 3.2* 3.3*  AST 56*  --   --   --  21  ALT 36  --   --   --  21  ALKPHOS 44  --   --   --  52  BILITOT 0.7  --   --   --  0.3  GFRNONAA 8*   < > 8* 8* 8*  GFRAA 9*   < > 9* 9* 10*  ANIONGAP 19*   < > 18* 17* 18*   < > = values in this interval not displayed.     Hematology Recent Labs  Lab 09/16/19 0517 09/17/19 0538 09/18/19 0252  WBC 12.0* 11.8* 11.2*  RBC 2.99* 2.85* 2.85*  HGB 9.5* 9.2* 9.2*  HCT 29.4* 27.8* 29.0*  MCV 98.3 97.5 101.8*  MCH 31.8 32.3 32.3  MCHC 32.3 33.1 31.7  RDW 15.0 14.8 14.9  PLT 114* 112* 117*    BNPNo results for input(s): BNP, PROBNP in the last 168 hours.   DDimer No results for input(s): DDIMER in the last 168 hours.   Radiology    DG Chest 1 View  Result Date: 09/16/2019 CLINICAL DATA:  Shortness of breath. EXAM: CHEST  1 VIEW COMPARISON:  Chest radiograph 09/15/2019 FINDINGS: Monitoring leads overlie the patient. Stable cardiac and mediastinal contours. Aortic atherosclerosis. Diffuse bilateral interstitial pulmonary opacities. No pleural effusion or pneumothorax. IMPRESSION: Diffuse bilateral interstitial pulmonary opacities may represent edema or atypical infection. Electronically Signed   By: Lovey Newcomer M.D.   On:  09/16/2019 09:35   US RENAL  Result Date: 09/16/2019 CLINICAL DATA:  Acute renal failure superimposed on stage IV chronic kidney disease. Congestive heart failure and coronary artery disease. EXAM: RENAL / URINARY TRACT ULTRASOUND COMPLETE COMPARISON:  None. FINDINGS: Right Kidney: Renal measurements: 8.4 x 5.3 x 4.6 cm = volume: 107 mL. Diffuse renal parenchymal thinning and increased echogenicity. A few small cysts are seen, largest measuring 1.9 cm. No mass or hydronephrosis visualized. Left Kidney: Renal measurements: 9.0 x 5.9 x 4.8 cm = volume: 134 mL. Diffuse renal parenchymal thinning and increased echogenicity. Several small cysts are seen, largest measuring 5.5 cm. No mass or hydronephrosis visualized. Bladder: Mild diffuse bladder wall thickening and enlarged prostate gland noted, likely due to chronic bladder outlet obstruction. Other: None. IMPRESSION: Findings of chronic medical renal disease. No evidence of hydronephrosis. Small bilateral renal cysts.  No renal masses identified. Diffuse bladder wall thickening, likely due to chronic bladder outlet obstruction given enlarged prostate. Electronically Signed   By: Marlaine Hind M.D.   On: 09/16/2019 14:03   DG CHEST PORT 1 VIEW  Result Date: 09/18/2019 CLINICAL DATA:  Shortness of breath. EXAM: PORTABLE CHEST 1 VIEW COMPARISON:  09/16/2019 FINDINGS: The heart is borderline enlarged. Stable tortuosity and calcification of the thoracic aorta. Persistent central vascular congestion and pulmonary edema. No focal infiltrates or large effusions. Prominent skin fold noted over the right upper chest. No pneumothorax. IMPRESSION: Persistent changes of CHF. Electronically Signed   By: Marijo Sanes M.D.   On: 09/18/2019 05:51   ECHOCARDIOGRAM COMPLETE  Result Date: 09/17/2019   ECHOCARDIOGRAM REPORT   Patient Name:   Tanner Pearson Date of Exam: 09/17/2019 Medical Rec #:  [id number]      Height:       70.0 in Accession #:    [id number]     Weight:        143.9 lb Date of Birth:  1928-11-30     BSA:          1.81 m Patient Age:    42 years       BP:           117/90 mmHg Patient Gender: M              HR:  82 bpm. Exam Location:  Inpatient Procedure: 2D Echo Indications:    CHF 428  History:        Patient has prior history of Echocardiogram examinations, most                 recent 03/23/2018. CHF, CAD; Risk Factors:Hypertension and                 Dyslipidemia.  Sonographer:    Jannett Celestine RDCS (AE) Referring Phys: [id number] VASUNDHRA [place]  1. Left ventricular ejection fraction, by visual estimation, is <20%. The left ventricle has severely decreased function. There is no left ventricular hypertrophy.  2. Definity contrast agent was given IV to delineate the left ventricular endocardial borders.  3. Elevated left atrial pressure.  4. Left ventricular diastolic parameters are consistent with Grade II diastolic dysfunction (pseudonormalization).  5. Moderately dilated left ventricular internal cavity size.  6. The left ventricle demonstrates global hypokinesis.  7. Global right ventricle has moderately reduced systolic function.The right ventricular size is mildly enlarged. No increase in right ventricular wall thickness.  8. Left atrial size was severely dilated.  9. Right atrial size was normal. 10. The mitral valve is abnormal. Mild mitral valve regurgitation. 11. The tricuspid valve is grossly normal. 12. The tricuspid valve is grossly normal. Tricuspid valve regurgitation is mild. 13. The aortic valve is abnormal. Aortic valve regurgitation is not visualized. 14. AV is thickence, calcified with restricted motion Peak and mean gradients through the valve are 24 and 15 mm Hg respectively LVOT/AV VTI 0.35 consistent with moderate AS. 15. The pulmonic valve was not well visualized. Pulmonic valve regurgitation is not visualized. 16. The inferior vena cava is dilated in size with >50% respiratory variability, suggesting right atrial pressure of  8 mmHg. 17. The interatrial septum was not well visualized. FINDINGS  Left Ventricle: Left ventricular ejection fraction, by visual estimation, is <20%. The left ventricle has severely decreased function. Definity contrast agent was given IV to delineate the left ventricular endocardial borders. The left ventricle demonstrates global hypokinesis. The left ventricular internal cavity size was moderately dilated left ventricle. There is no left ventricular hypertrophy. Left ventricular diastolic parameters are consistent with Grade II diastolic dysfunction (pseudonormalization). Elevated left atrial pressure. Right Ventricle: The right ventricular size is mildly enlarged. No increase in right ventricular wall thickness. Global RV systolic function is has moderately reduced systolic function. Left Atrium: Left atrial size was severely dilated. Right Atrium: Right atrial size was normal in size Pericardium: There is no evidence of pericardial effusion. Mitral Valve: The mitral valve is abnormal. There is mild thickening of the mitral valve leaflet(s). Mild mitral valve regurgitation. Tricuspid Valve: The tricuspid valve is grossly normal. Tricuspid valve regurgitation is mild. Aortic Valve: The aortic valve is abnormal. Aortic valve regurgitation is not visualized. Aortic valve mean gradient measures 15.0 mmHg. Aortic valve peak gradient measures 24.2 mmHg. Aortic valve area, by VTI measures 1.09 cm. AV is thickence, calcified with restricted motion Peak and mean gradients through the valve are 24 and 15 mm Hg respectively LVOT/AV VTI 0.35 consistent with moderate AS. Pulmonic Valve: The pulmonic valve was not well visualized. Pulmonic valve regurgitation is not visualized. Pulmonic regurgitation is not visualized. Aorta: The aortic root and ascending aorta are structurally normal, with no evidence of dilitation. Venous: The inferior vena cava is dilated in size with greater than 50% respiratory variability, suggesting  right atrial pressure of 8 mmHg. IAS/Shunts: The interatrial septum was not well visualized.  LEFT VENTRICLE PLAX 2D LVIDd:         6.00 cm  Diastology LVIDs:         5.20 cm  LV e' lateral:   6.74 cm/s LV PW:         1.10 cm  LV E/e' lateral: 13.7 LV IVS:        1.00 cm  LV e' medial:    3.15 cm/s LVOT diam:     2.00 cm  LV E/e' medial:  29.2 LV SV:         50 ml LV SV Index:   28.21 LVOT Area:     3.14 cm  RIGHT VENTRICLE RV S prime:     7.94 cm/s TAPSE (M-mode): 1.5 cm LEFT ATRIUM             Index       RIGHT ATRIUM           Index LA diam:        4.00 cm 2.20 cm/m  RA Area:     12.00 cm LA Vol (A2C):   83.6 ml 46.07 ml/m RA Volume:   25.40 ml  14.00 ml/m LA Vol (A4C):   89.8 ml 49.48 ml/m LA Biplane Vol: 86.6 ml 47.72 ml/m  AORTIC VALVE AV Area (Vmax):    1.20 cm AV Area (Vmean):   1.20 cm AV Area (VTI):     1.09 cm AV Vmax:           246.00 cm/s AV Vmean:          183.000 cm/s AV VTI:            0.526 m AV Peak Grad:      24.2 mmHg AV Mean Grad:      15.0 mmHg LVOT Vmax:         93.80 cm/s LVOT Vmean:        69.800 cm/s LVOT VTI:          0.183 m LVOT/AV VTI ratio: 0.35  AORTA Ao Root diam: 3.30 cm MITRAL VALVE MV Area (PHT): 3.65 cm             SHUNTS MV PHT:        60.32 msec           Systemic VTI:  0.18 m MV Decel Time: 208 msec             Systemic Diam: 2.00 cm MV E velocity: 92.10 cm/s 103 cm/s MV A velocity: 74.60 cm/s 70.3 cm/s MV E/A ratio:  1.23       1.5  Dorris Carnes MD Electronically signed by Dorris Carnes MD Signature Date/Time: 09/17/2019/11:15:27 AM    Final    VAS [place] LOWER EXTREMITY VENOUS (DVT)  Result Date: 09/17/2019  Lower Venous Study Indications: SOB.  Risk Factors: CKD stage IV. Comparison Study: No prior study on file Performing Technologist: Sharion Dove RVS  Examination Guidelines: A complete evaluation includes B-mode imaging, spectral Doppler, color Doppler, and power Doppler as needed of all accessible portions of each vessel. Bilateral testing is considered an  integral part of a complete examination. Limited examinations for reoccurring indications may be performed as noted.  +---------+---------------+---------+-----------+----------+--------------+  RIGHT     Compressibility Phasicity Spontaneity Properties Thrombus Aging  +---------+---------------+---------+-----------+----------+--------------+  CFV       Full            Yes       Yes                                    +---------+---------------+---------+-----------+----------+--------------+  SFJ       Full                                                             +---------+---------------+---------+-----------+----------+--------------+  FV Prox   Full                                                             +---------+---------------+---------+-----------+----------+--------------+  FV Mid    Full                                                             +---------+---------------+---------+-----------+----------+--------------+  FV Distal Full                                                             +---------+---------------+---------+-----------+----------+--------------+  PFV       Full                                                             +---------+---------------+---------+-----------+----------+--------------+  POP       Full            Yes       Yes                                    +---------+---------------+---------+-----------+----------+--------------+  PTV       Full                                                             +---------+---------------+---------+-----------+----------+--------------+  PERO      Full                                                             +---------+---------------+---------+-----------+----------+--------------+   +---------+---------------+---------+-----------+----------+--------------+  LEFT      Compressibility Phasicity Spontaneity Properties Thrombus Aging  +---------+---------------+---------+-----------+----------+--------------+  CFV        Full            Yes       Yes                                    +---------+---------------+---------+-----------+----------+--------------+  SFJ       Full                                                             +---------+---------------+---------+-----------+----------+--------------+  FV Prox   Full                                                             +---------+---------------+---------+-----------+----------+--------------+  FV Mid    Full                                                             +---------+---------------+---------+-----------+----------+--------------+  FV Distal Full                                                             +---------+---------------+---------+-----------+----------+--------------+  PFV       Full                                                             +---------+---------------+---------+-----------+----------+--------------+  POP       Full            Yes       Yes                                    +---------+---------------+---------+-----------+----------+--------------+  PTV       Full                                                             +---------+---------------+---------+-----------+----------+--------------+  PERO      Full                                                             +---------+---------------+---------+-----------+----------+--------------+     Summary: Right: There is no evidence of deep vein thrombosis in the lower extremity. Pulsatile waveforms noted, suggestive of fluid overload Left: There is no evidence of deep vein thrombosis in the lower extremity. Pulsatile waveforms noted, suggestive of fluid overload  *See table(s) above for measurements and observations. Electronically signed by Servando Snare  MD on 09/17/2019 at 10:54:43 AM.    Final     Cardiac Studies   2D Echo 09/17/19   FINDINGS  Left Ventricle: Left ventricular ejection fraction, by visual estimation,  is <20%. The left ventricle has severely  decreased function. Definity  contrast agent was given IV to delineate the left ventricular endocardial  borders. The left ventricle  demonstrates global hypokinesis. The left ventricular internal cavity size  was moderately dilated left ventricle. There is no left ventricular  hypertrophy. Left ventricular diastolic parameters are consistent with  Grade II diastolic dysfunction  (pseudonormalization). Elevated left atrial pressure.   Right Ventricle: The right ventricular size is mildly enlarged. No  increase in right ventricular wall thickness. Global RV systolic function  is has moderately reduced systolic function.   Left Atrium: Left atrial size was severely dilated.   Right Atrium: Right atrial size was normal in size   Pericardium: There is no evidence of pericardial effusion.   Mitral Valve: The mitral valve is abnormal. There is mild thickening of  the mitral valve leaflet(s). Mild mitral valve regurgitation.   Tricuspid Valve: The tricuspid valve is grossly normal. Tricuspid valve  regurgitation is mild.   Aortic Valve: The aortic valve is abnormal. Aortic valve regurgitation is  not visualized. Aortic valve mean gradient measures 15.0 mmHg. Aortic  valve peak gradient measures 24.2 mmHg. Aortic valve area, by VTI measures  1.09 cm. AV is thickence,  calcified with restricted motion Peak and mean gradients through the valve  are 24 and 15 mm Hg respectively LVOT/AV VTI 0.35 consistent with moderate  AS.   Pulmonic Valve: The pulmonic valve was not well visualized. Pulmonic valve  regurgitation is not visualized. Pulmonic regurgitation is not visualized.   Aorta: The aortic root and ascending aorta are structurally normal, with  no evidence of dilitation.   Venous: The inferior vena cava is dilated in size with greater than 50%  respiratory variability, suggesting right atrial pressure of 8 mmHg.   IAS/Shunts: The interatrial septum was not well visualized.       LEFT VENTRICLE  PLAX 2D  LVIDd:     6.00 cm Diastology  LVIDs:     5.20 cm LV e' lateral:  6.74 cm/s  LV PW:     1.10 cm LV E/e' lateral: 13.7  LV IVS:    1.00 cm LV e' medial:  3.15 cm/s  LVOT diam:   2.00 cm LV E/e' medial: 29.2  LV SV:     50 ml  LV SV Index:  28.21  LVOT Area:   3.14 cm     RIGHT VENTRICLE  RV S prime:   7.94 cm/s  TAPSE (M-mode): 1.5 cm   LEFT ATRIUM       Index    RIGHT ATRIUM      Index  LA diam:    4.00 cm 2.20 cm/m RA Area:   12.00 cm  LA Vol (A2C):  83.6 ml 46.07 ml/m RA Volume:  25.40 ml 14.00 ml/m  LA Vol (A4C):  89.8 ml 49.48 ml/m  LA Biplane Vol: 86.6 ml 47.72 ml/m  AORTIC VALVE  AV Area (Vmax):  1.20 cm  AV Area (Vmean):  1.20 cm  AV Area (VTI):   1.09 cm  AV Vmax:      246.00 cm/s  AV Vmean:     183.000 cm/s  AV VTI:      0.526 m  AV Peak Grad:   24.2 mmHg  AV Mean  Grad:   15.0 mmHg  LVOT Vmax:     93.80 cm/s  LVOT Vmean:    69.800 cm/s  LVOT VTI:     0.183 m  LVOT/AV VTI ratio: 0.35    AORTA  Ao Root diam: 3.30 cm   MITRAL VALVE  MV Area (PHT): 3.65 cm       SHUNTS  MV PHT:    60.32 msec      Systemic VTI: 0.18 m  MV Decel Time: 208 msec       Systemic Diam: 2.00 cm  MV E velocity: 92.10 cm/s 103 cm/s  MV A velocity: 74.60 cm/s 70.3 cm/s  MV E/A ratio: 1.23    1.5     Dorris Carnes MD  Electronically signed by Dorris Carnes MD  Signature Date/Time: 09/17/2019/11:15:27 AM      Final    Patient Profile     86 y.o. male with CAD (details unclear, with NSTEMI 07/2015 managed medically), ischemic cardiomyopathy/chronic combined CHF, hypertension, hyperlipidemia, chronic appearing anemia, and stage IV-V CKD who was admitted to [facility] from [facility] with increasing SOB, found to have elevated BNP, d-dimer, Cr of 5.8, and elevated troponin. 2D Echo 09/17/2019 which revealed EF is now less  than 20%, grade 2 DD, mild MR, severe LAE, moderate aortic stenosis (EF previously 35-40%).  Assessment & Plan    1. Acute on chronic combined CHF with worsening cardiomyopathy  - volume overload also likely exacerbated by renal failure. Soft blood pressure prohibits aggressive med titration, also not a candidate for ACEi/ARB/ARNI/spiro due to advanced renal disease. Anticipate conservative management of cardiomyopathy only. Weight has been going up since admission, +3lb, but I/O's at -1.4L. He feels quite a bit better, suggesting clinical improvement. He is receiving Lasix 100mg  q6hr. Nephrology is driving diuretic plan.   2. AKI superimposed on CKD stage IV-V - baseline Cr reported 3.8-4, peak 5.94, slowly improving - 5.6 today. Nephrology following. Pt was not sure about HD and wanted time to think about it. Daughter is a Merchandiser, retail.   3. Reported history of CAD with NSTEMI (here with new LBBB as well) - no recent chest discomfort. In context of comorbidities to include kidney failure, he is not felt to be a good candidate for invasive management. He remains on ASA, statin, heparin per pharmacy - consider cessation of heparin. Will review with MD whether addition of Plavix would be helpful (have to document from metric standpoint).  4. Essential HTN - BP remains low-normal.  5. Hyperlipidemia - consider titration of pravastatin to atorvastatin. Would not use rosuvastatin given renal insufficiency.  6. Chronic appearing anemia - appears stable by our labs.   7. Moderate AS  - unlikely to be candidate for invasive management given comorbidities. Cannot exclude that AS is more severe than it is on echo given his LV dysfunction, but not sure I would pursue further w/u if it would not change management. Plan to d/w MD.  For questions or updates, please contact [place] Please consult [email or web address] for contact info under Cardiology/STEMI.  Signed, Charlie Pitter, PA-C 09/18/2019, 8:10 AM     I have seen and examined the patient along with Charlie Pitter, PA-C.  I have reviewed the chart, notes and new data.  I agree with PA/NP's note.  Key new complaints: still sleeping with HOB at 30 deg (at home sleeps flat). Good UO. No angina Key examination changes: weight 145 lb (bed scale), "dry weight" around 140 lb with very  narrow margin of compensation Key new findings / data: severe CKD, stage 5, stable creatinine w diuresis  PLAN: Add clopidogrel. Agree w change to atorvastatin. Stop IV heparin - switch to prophylactic dose. Continue slow diuresis, try to get to 140 lb. Use upright scale for weights.  Sanda Klein, MD, [place] [phone] 09/18/2019, 10:25 AM

## 2019-09-18 NOTE — Progress Notes (Signed)
[facility] Progress Note    Assessment/ Plan:    SOB/ pulm edema - looks like vol overload in CKD patient. UOP is picking up fortunately. Pt not sure about dialysis and wants time to think about it. He is not uremic but BUN rising. Cont IV lasix for now.  Daughter is hospice nurse for hospice home in [place], her number is [phone] and she would appreciate updates (at home w/ +COVID).  Discussed dialysis again today--> doesn't seem like he would want to do it but is undecided- d/w primary- will get pall c/s   AKI on CKD IV - baseline creat 3.8- 4. Get clinic records monday.UA and US unremarkable. Not uremic as above.   HTN - agree w/ holding home coreg and hydralazine, BP's  normal   HL  H/o CAD  H/o combined syst/ diast CHF  Subjective:    Nearly 2L UOP yesterday.  Cr down a little, BUN up to 136.  Pt reports feeling better, good appetite, has "phlegm" in throat however.     Objective:   BP 132/72 (BP Location: Left Arm)   Pulse 62   Temp 97.7 F (36.5 C) (Oral)   Resp 18   Ht 5\' 10"  (1.778 m)   Wt 63.5 kg   SpO2 100%   BMI 20.09 kg/m   Intake/Output Summary (Last 24 hours) at 09/18/2019 1206 Last data filed at 09/18/2019 1100 Gross per 24 hour  Intake 674 ml  Output 2176 ml  Net -1502 ml   Weight change: 0.8 kg  Physical Exam: Gen: NAD, sitting in bed CVS: RRR Resp: bibasilar crackles persist Abd: soft Ext: no real LE edema  Imaging: US RENAL  Result Date: 09/16/2019 CLINICAL DATA:  Acute renal failure superimposed on stage IV chronic kidney disease. Congestive heart failure and coronary artery disease. EXAM: RENAL / URINARY TRACT ULTRASOUND COMPLETE COMPARISON:  None. FINDINGS: Right Kidney: Renal measurements: 8.4 x 5.3 x 4.6 cm = volume: 107 mL. Diffuse renal parenchymal thinning and increased echogenicity. A few small cysts are seen, largest measuring 1.9 cm. No mass or hydronephrosis visualized. Left Kidney: Renal measurements: 9.0 x 5.9  x 4.8 cm = volume: 134 mL. Diffuse renal parenchymal thinning and increased echogenicity. Several small cysts are seen, largest measuring 5.5 cm. No mass or hydronephrosis visualized. Bladder: Mild diffuse bladder wall thickening and enlarged prostate gland noted, likely due to chronic bladder outlet obstruction. Other: None. IMPRESSION: Findings of chronic medical renal disease. No evidence of hydronephrosis. Small bilateral renal cysts.  No renal masses identified. Diffuse bladder wall thickening, likely due to chronic bladder outlet obstruction given enlarged prostate. Electronically Signed   By: Marlaine Hind M.D.   On: 09/16/2019 14:03   DG CHEST PORT 1 VIEW  Result Date: 09/18/2019 CLINICAL DATA:  Shortness of breath. EXAM: PORTABLE CHEST 1 VIEW COMPARISON:  09/16/2019 FINDINGS: The heart is borderline enlarged. Stable tortuosity and calcification of the thoracic aorta. Persistent central vascular congestion and pulmonary edema. No focal infiltrates or large effusions. Prominent skin fold noted over the right upper chest. No pneumothorax. IMPRESSION: Persistent changes of CHF. Electronically Signed   By: Marijo Sanes M.D.   On: 09/18/2019 05:51   ECHOCARDIOGRAM COMPLETE  Result Date: 09/17/2019   ECHOCARDIOGRAM REPORT   Patient Name:   Tanner Pearson Date of Exam: 09/17/2019 Medical Rec #:  [id number]      Height:       70.0 in Accession #:    [id number]  Weight:       143.9 lb Date of Birth:  [birth year]     BSA:          1.81 m Patient Age:    86 years       BP:           117/90 mmHg Patient Gender: M              HR:           82 bpm. Exam Location:  Inpatient Procedure: 2D Echo Indications:    CHF 428  History:        Patient has prior history of Echocardiogram examinations, most                 recent 03/23/2018. CHF, CAD; Risk Factors:Hypertension and                 Dyslipidemia.  Sonographer:    Jannett Celestine RDCS (AE) Referring Phys: [id number] VASUNDHRA [place]  1. Left ventricular  ejection fraction, by visual estimation, is <20%. The left ventricle has severely decreased function. There is no left ventricular hypertrophy.  2. Definity contrast agent was given IV to delineate the left ventricular endocardial borders.  3. Elevated left atrial pressure.  4. Left ventricular diastolic parameters are consistent with Grade II diastolic dysfunction (pseudonormalization).  5. Moderately dilated left ventricular internal cavity size.  6. The left ventricle demonstrates global hypokinesis.  7. Global right ventricle has moderately reduced systolic function.The right ventricular size is mildly enlarged. No increase in right ventricular wall thickness.  8. Left atrial size was severely dilated.  9. Right atrial size was normal. 10. The mitral valve is abnormal. Mild mitral valve regurgitation. 11. The tricuspid valve is grossly normal. 12. The tricuspid valve is grossly normal. Tricuspid valve regurgitation is mild. 13. The aortic valve is abnormal. Aortic valve regurgitation is not visualized. 14. AV is thickence, calcified with restricted motion Peak and mean gradients through the valve are 24 and 15 mm Hg respectively LVOT/AV VTI 0.35 consistent with moderate AS. 15. The pulmonic valve was not well visualized. Pulmonic valve regurgitation is not visualized. 16. The inferior vena cava is dilated in size with >50% respiratory variability, suggesting right atrial pressure of 8 mmHg. 17. The interatrial septum was not well visualized. FINDINGS  Left Ventricle: Left ventricular ejection fraction, by visual estimation, is <20%. The left ventricle has severely decreased function. Definity contrast agent was given IV to delineate the left ventricular endocardial borders. The left ventricle demonstrates global hypokinesis. The left ventricular internal cavity size was moderately dilated left ventricle. There is no left ventricular hypertrophy. Left ventricular diastolic parameters are consistent with Grade II  diastolic dysfunction (pseudonormalization). Elevated left atrial pressure. Right Ventricle: The right ventricular size is mildly enlarged. No increase in right ventricular wall thickness. Global RV systolic function is has moderately reduced systolic function. Left Atrium: Left atrial size was severely dilated. Right Atrium: Right atrial size was normal in size Pericardium: There is no evidence of pericardial effusion. Mitral Valve: The mitral valve is abnormal. There is mild thickening of the mitral valve leaflet(s). Mild mitral valve regurgitation. Tricuspid Valve: The tricuspid valve is grossly normal. Tricuspid valve regurgitation is mild. Aortic Valve: The aortic valve is abnormal. Aortic valve regurgitation is not visualized. Aortic valve mean gradient measures 15.0 mmHg. Aortic valve peak gradient measures 24.2 mmHg. Aortic valve area, by VTI measures 1.09 cm. AV is thickence, calcified with restricted motion Peak and mean gradients  through the valve are 24 and 15 mm Hg respectively LVOT/AV VTI 0.35 consistent with moderate AS. Pulmonic Valve: The pulmonic valve was not well visualized. Pulmonic valve regurgitation is not visualized. Pulmonic regurgitation is not visualized. Aorta: The aortic root and ascending aorta are structurally normal, with no evidence of dilitation. Venous: The inferior vena cava is dilated in size with greater than 50% respiratory variability, suggesting right atrial pressure of 8 mmHg. IAS/Shunts: The interatrial septum was not well visualized.  LEFT VENTRICLE PLAX 2D LVIDd:         6.00 cm  Diastology LVIDs:         5.20 cm  LV e' lateral:   6.74 cm/s LV PW:         1.10 cm  LV E/e' lateral: 13.7 LV IVS:        1.00 cm  LV e' medial:    3.15 cm/s LVOT diam:     2.00 cm  LV E/e' medial:  29.2 LV SV:         50 ml LV SV Index:   28.21 LVOT Area:     3.14 cm  RIGHT VENTRICLE RV S prime:     7.94 cm/s TAPSE (M-mode): 1.5 cm LEFT ATRIUM             Index       RIGHT ATRIUM            Index LA diam:        4.00 cm 2.20 cm/m  RA Area:     12.00 cm LA Vol (A2C):   83.6 ml 46.07 ml/m RA Volume:   25.40 ml  14.00 ml/m LA Vol (A4C):   89.8 ml 49.48 ml/m LA Biplane Vol: 86.6 ml 47.72 ml/m  AORTIC VALVE AV Area (Vmax):    1.20 cm AV Area (Vmean):   1.20 cm AV Area (VTI):     1.09 cm AV Vmax:           246.00 cm/s AV Vmean:          183.000 cm/s AV VTI:            0.526 m AV Peak Grad:      24.2 mmHg AV Mean Grad:      15.0 mmHg LVOT Vmax:         93.80 cm/s LVOT Vmean:        69.800 cm/s LVOT VTI:          0.183 m LVOT/AV VTI ratio: 0.35  AORTA Ao Root diam: 3.30 cm MITRAL VALVE MV Area (PHT): 3.65 cm             SHUNTS MV PHT:        60.32 msec           Systemic VTI:  0.18 m MV Decel Time: 208 msec             Systemic Diam: 2.00 cm MV E velocity: 92.10 cm/s 103 cm/s MV A velocity: 74.60 cm/s 70.3 cm/s MV E/A ratio:  1.23       1.5  Dorris Carnes MD Electronically signed by Dorris Carnes MD Signature Date/Time: 09/17/2019/11:15:27 AM    Final     Labs: BMET Recent Labs  Lab 09/16/19 0517 09/16/19 1352 09/17/19 0538 09/18/19 0252  NA 139 139 140 141  K 5.0 4.8 4.3 4.3  CL 105 106 106 107  CO2 15* 15* 17* 16*  GLUCOSE 128* 119* 129* 133*  BUN  106* 111* 121* 136*  CREATININE 5.81* 5.94* 5.80* 5.67*  CALCIUM 8.2* 8.1* 8.0* 8.3*  PHOS  --  8.4* 7.8* 7.5*   CBC Recent Labs  Lab 09/16/19 0517 09/17/19 0538 09/18/19 0252  WBC 12.0* 11.8* 11.2*  NEUTROABS  --   --  8.4*  HGB 9.5* 9.2* 9.2*  HCT 29.4* 27.8* 29.0*  MCV 98.3 97.5 101.8*  PLT 114* 112* 117*    Medications:    . aspirin EC  81 mg Oral Daily  . atorvastatin  80 mg Oral q1800  . B-complex with vitamin C  1 tablet Oral Daily  . feeding supplement (NEPRO CARB STEADY)  237 mL Oral Q24H  . guaiFENesin  1,200 mg Oral BID  . heparin injection (subcutaneous)  5,000 Units Subcutaneous Q8H      Madelon Lips, MD 09/18/2019, 12:06 PM

## 2019-09-18 NOTE — Progress Notes (Signed)
PROGRESS NOTE    Tanner Pearson  [id number] DOB: 12/30/1928 DOA: 09/16/2019 PCP: Ernestene Kiel, MD  Brief Narrative:  HPI per Dr. Shela Leff on 09/16/2019 Tanner Pearson is a 86 y.o. male with medical history significant of CKD stage IV followed by Dr. [name] and has a right forearm AV fistula in place but not started dialysis yet, hypertension, hyperlipidemia, CHF with echo done in April 2016 EF 35 to 40%, PAD, anemia, GERD, gout presented to [facility] ED on 1/29 for evaluation of shortness of breath.  Patient reports 3-day history of shortness of breath and cough.  Also reports orthopnea.  No lower extremity edema.  Denies fevers, chills, fatigue, body aches, vomiting, abdominal pain, or diarrhea.  States he saw his daughter a week ago and she recently tested positive for COVID-19.  ED course at [facility]:  EMS reported oxygen saturation 95% on room air. Tachycardic and tachypneic in the ED, blood pressure 156/78.  Patient was placed on 2 L supplemental oxygen due to increased work of breathing and tachypnea.  SARS-CoV-2 PCR test negative, influenza panel negative WBC count 14.6, hemoglobin 10.4, MCV 99, platelet count 149 Procalcitonin 0.08 Sodium 140, potassium 4.9, chloride 111, bicarb 14, anion gap 20, BUN 88, creatinine 5.4, glucose 247 Lactic acid 3.5 >2.1 after IV fluid LFTs normal A1c 5.5 BNP significantly elevated at 38,600 D-dimer elevated at 3393 Troponin 0.10 >0.26 >0.69 >9.5.  EKG with LBBB, no recent tracing for comparison. FOBT negative Blood cultures pending  Chest x-ray showing bilateral patchy opacities concerning for pneumonia. CT chest without contrast showing mild cardiomegaly with moderate bilateral pleural effusions, diffuse interlobular septal thickening and redistribution of the pulmonary vascularity and peribronchovascular cuffing.  Findings favored to reflect a combination of CHF and pulmonary edema.  Superimposed multifocal pneumonia also a  consideration given multifocal regions of peripheral consolidation.  Also showing mild thoracic esophageal mural thickening, outpatient endoscopy recommended for further evaluation.  Extensive coronary artery calcifications.  Aortic atherosclerosis.  Cholelithiasis without findings of acute cholecystitis.  Mild bilateral perinephric stranding.  VQ scan low probability for PE.  Patient was treated with ceftriaxone and azithromycin.  Patient was given 2 L IV fluid per sepsis protocol in the ED.  He was started on bicarb drip for metabolic acidosis.  Also started on heparin infusion  **Interim History Patient had a repeat echocardiogram was found to have acute on chronic combined systolic and diastolic CHF with worsening EF of <20%and worsening diastolic dysfunction.  Cardiology is consulted for further evaluation recommendations.  Patient's renal function and he has an AKI on CKD stage IV.  Nephrology is aggressively diuresing with 100 mg of IV Lasix every 6 and continuing course. Palliative Care consulted for further evaluation and GOC discussion.   Assessment & Plan:   Principal Problem:   Pulmonary edema Active Problems:   Essential hypertension   Chronic kidney disease (CKD), stage IV (severe) (HCC)   CHF exacerbation (HCC)   AKI (acute kidney injury) ([place])  Acute Hypoxemic Respiratory Failure with Pulmonary Edema in the setting of acute on chronic combined systolic and diastolic CHF as well as likely due to worsening/progression of CKD in setting of acute illness. Aortic stenosis may have also progressed, moderate to severe on 2019 echocardiogram, improving  -Initially thought to have CAP, currently on azithromycin (day 3/3) and ceftriaxone (day 3 of 7).  But will stop as his chest x-ray is more consistent with fluid volume overload as he is afebrile and has a minimal leukocytosis -PCT  was 0.85 but in the setting of renal failure and less likely infection -For pulmonary edema, furosemide  ordered per Nephrology, appreciate recommendations -COVID negative X2--transfer to alternate unit  - Wean oxygen goal O2 sat >92% - Incentive spirometry - Follow up blood cultures showed no growth to date at 2 day -Repeat chest x-ray today showed "The heart is borderline enlarged. Stable tortuosity and calcification of the thoracic aorta. Persistent central vascular congestion and pulmonary edema. No focal infiltrates or large effusions. Prominent skin fold noted over the right upper chest. No pneumothorax." -Continue diuresis per nephro -SpO2: 100 % O2 Flow Rate (L/min): 2 L/min -Wean O2 as tolerated -Continue supplemental oxygen via nasal cannula and maintain O2 saturations greater than 90% -We will need an ambulatory home O2 screen prior to discharge next-cardiology consulted for further evaluation and he is currently not a candidate for any invasive studies due to risk for contrast-induced nephropathy given his recent worsening of his renal function and cardiology believes that his cardiac catheterization will not likely offer him any significant benefit either -Do not clinically feel that his quality of life would improve if he is to go on dialysis and daughter agrees -We will consult palliative care for goals of care discussion  Acute on chronic combined systolic and diastolic CHF; worsened -Likely contributed heavily to the patient's shortness of breath and acute pulmonary edema -EF is down to 20% and echo showed worsening grade 2 diastolic dysfunction next-also has aortic stenosis  -Echo results as below -Cardiology recommending that he is not a candidate for any ACE inhibitors or ARB therapy or well spironolactone given his renal function -His carvedilol 25 mg p.o. twice daily and he takes BiDil 20-7.5 mg 3 times daily currently being held -Cardiology recommending that when his heart rate becomes more stable consider adding back his carvedilol with caution not to drop his blood  pressure -Further management per cardiology -Strict I's and O's, daily weights -Not really made much urine but is currently - 1,906 mL since admission  -Currently getting heavily diuresed with 100 mg of IV Lasix every 6 hours scheduled for nephrology -Continue to monitor for signs and symptoms of volume overload and repeat chest x-ray in a.m.  Acute kidney injury on CKD IV Metabolic acidosis -baseline creatinine ~ 4 at last value in system - Nephrology consulted, appreciate recommendations -Renal ultrasound showed Findings of chronic medical renal disease. No evidence of Hydronephrosis. Small bilateral renal cysts.  No renal masses identified. Diffuse bladder wall thickening, likely due to chronic bladder outlet obstruction given enlarged prostate" -Lytes done to check FeNa -Patient had a metabolic acidosis on admission with a CO2 of 15, chloride level of 106, and an anion gap of 18; repeat this morning shows a CO2 of 16, and anion gap of 18, and a chloride level of 107 - Strict I/O, bladder scans - Avoid nephrotoxic agents, contrast dyes, hypotension if possible and renally adjust medications -Patient's urinalysis showed a hazy appearance with straw-colored urine, small hemoglobin, negative nitrites, negative leukocytes negative ketones, rare bacteria, -BUNs/creatinine went from 111/5.94 -> 121/5.80 -> 136/5.67 -Continue to monitor and trend renal function and further care per nephrology as he is getting diuresed with 100 mg of Lasix IV every 6 scheduled -Nephrology broached the subject of dialysis however clinically I do not feel that his quality of life would benefit if he was to go on dialysis and patient is undecided about dialysis -We will consult palliative care for further goals of care discussion  NSTEMI  type II, likely due to demand ischemia in setting of acute illness as well as recent acute on chronic combined CHF, AKI  - Denies chest pain - Echocardiogram ordered and is  below -Cardiology formally consulted for further evaluation -Continue with Telemetry -Patient's high-sensitivity troponin was 12,172 on admission and repeat was 11,574 -Repeat EKG ordered -Restarted ASA 81 mg and Atorvastatin 80 mg po Daily   -Heparin drip has now been stopped by cardiology and patient was started on Plavix  Aortic stenosis -Caution with excess diuresis/fluids given moderate to severe -Echocardiogram as below -Cardiology consulted for further evaluation  HLD - Pravastatin  HTN - Hold Carvedilol and BiDil for now given his lower blood pressures -Last BP was 132/72  CAD -Patient denies any prior stent placement---continue ASA and Atorvastatin and now adding Plavix   Normocytic anemia/anemia of chronic kidney disease -Iron studies was done and showed a iron level of 40, U IBC 173, TIBC of 213, saturation ratio is 19%, ferritin of 118 -Patient's hemoglobin/hematocrit is now 9.2/29.0 -Continue to monitor for signs and symptoms of bleeding as he is now anticoagulated with a heparin drip -Repeat CBC in a.m.  Leukocytosis, improving  -Likely reactive in the setting of above next-currently do not clinically feel has any signs of infection -WBC went from 11.8 -> 11.2 -Continue monitor and trend and repeat CBC in a.m.  Thrombocytopenia -Patient's Platelet Count went from 114 -> 112 -> 117 -Continue to Monitor for S/Sx of Bleeding -Repeat CBC in AM   DVT prophylaxis: Anticoagulated with a heparin drip Code Status: FULL CODE  Family Communication: Discussed with Daughter Engineer, manufacturing systems) via the Telephone Disposition Plan: Patient from home and will need PT OT evaluation as well as cardiac and nephrology clearance for a safe discharge disposition  Consultants:   Nephrology  Cardiology   Procedures:  ECHOCARDIOGRAM IMPRESSIONS    1. Left ventricular ejection fraction, by visual estimation, is <20%. The  left ventricle has severely decreased function. There is no  left  ventricular hypertrophy.  2. Definity contrast agent was given IV to delineate the left ventricular  endocardial borders.  3. Elevated left atrial pressure.  4. Left ventricular diastolic parameters are consistent with Grade II  diastolic dysfunction (pseudonormalization).  5. Moderately dilated left ventricular internal cavity size.  6. The left ventricle demonstrates global hypokinesis.  7. Global right ventricle has moderately reduced systolic function.The  right ventricular size is mildly enlarged. No increase in right  ventricular wall thickness.  8. Left atrial size was severely dilated.  9. Right atrial size was normal.  10. The mitral valve is abnormal. Mild mitral valve regurgitation.  11. The tricuspid valve is grossly normal.  12. The tricuspid valve is grossly normal. Tricuspid valve regurgitation  is mild.  13. The aortic valve is abnormal. Aortic valve regurgitation is not  visualized.  14. AV is thickence, calcified with restricted motion Peak and mean  gradients through the valve are 24 and 15 mm Hg respectively LVOT/AV VTI  0.35 consistent with moderate AS.  15. The pulmonic valve was not well visualized. Pulmonic valve  regurgitation is not visualized.  16. The inferior vena cava is dilated in size with >50% respiratory  variability, suggesting right atrial pressure of 8 mmHg.  17. The interatrial septum was not well visualized.   FINDINGS  Left Ventricle: Left ventricular ejection fraction, by visual estimation,  is <20%. The left ventricle has severely decreased function. Definity  contrast agent was given IV to delineate the left ventricular  endocardial  borders. The left ventricle  demonstrates global hypokinesis. The left ventricular internal cavity size  was moderately dilated left ventricle. There is no left ventricular  hypertrophy. Left ventricular diastolic parameters are consistent with  Grade II diastolic dysfunction   (pseudonormalization). Elevated left atrial pressure.   Right Ventricle: The right ventricular size is mildly enlarged. No  increase in right ventricular wall thickness. Global RV systolic function  is has moderately reduced systolic function.   Left Atrium: Left atrial size was severely dilated.   Right Atrium: Right atrial size was normal in size   Pericardium: There is no evidence of pericardial effusion.   Mitral Valve: The mitral valve is abnormal. There is mild thickening of  the mitral valve leaflet(s). Mild mitral valve regurgitation.   Tricuspid Valve: The tricuspid valve is grossly normal. Tricuspid valve  regurgitation is mild.   Aortic Valve: The aortic valve is abnormal. Aortic valve regurgitation is  not visualized. Aortic valve mean gradient measures 15.0 mmHg. Aortic  valve peak gradient measures 24.2 mmHg. Aortic valve area, by VTI measures  1.09 cm. AV is thickence,  calcified with restricted motion Peak and mean gradients through the valve  are 24 and 15 mm Hg respectively LVOT/AV VTI 0.35 consistent with moderate  AS.   Pulmonic Valve: The pulmonic valve was not well visualized. Pulmonic valve  regurgitation is not visualized. Pulmonic regurgitation is not visualized.   Aorta: The aortic root and ascending aorta are structurally normal, with  no evidence of dilitation.   Venous: The inferior vena cava is dilated in size with greater than 50%  respiratory variability, suggesting right atrial pressure of 8 mmHg.   IAS/Shunts: The interatrial septum was not well visualized.     LEFT VENTRICLE  PLAX 2D  LVIDd:     6.00 cm Diastology  LVIDs:     5.20 cm LV e' lateral:  6.74 cm/s  LV PW:     1.10 cm LV E/e' lateral: 13.7  LV IVS:    1.00 cm LV e' medial:  3.15 cm/s  LVOT diam:   2.00 cm LV E/e' medial: 29.2  LV SV:     50 ml  LV SV Index:  28.21  LVOT Area:   3.14 cm     RIGHT VENTRICLE  RV S prime:   7.94 cm/s   TAPSE (M-mode): 1.5 cm   LEFT ATRIUM       Index    RIGHT ATRIUM      Index  LA diam:    4.00 cm 2.20 cm/m RA Area:   12.00 cm  LA Vol (A2C):  83.6 ml 46.07 ml/m RA Volume:  25.40 ml 14.00 ml/m  LA Vol (A4C):  89.8 ml 49.48 ml/m  LA Biplane Vol: 86.6 ml 47.72 ml/m  AORTIC VALVE  AV Area (Vmax):  1.20 cm  AV Area (Vmean):  1.20 cm  AV Area (VTI):   1.09 cm  AV Vmax:      246.00 cm/s  AV Vmean:     183.000 cm/s  AV VTI:      0.526 m  AV Peak Grad:   24.2 mmHg  AV Mean Grad:   15.0 mmHg  LVOT Vmax:     93.80 cm/s  LVOT Vmean:    69.800 cm/s  LVOT VTI:     0.183 m  LVOT/AV VTI ratio: 0.35    AORTA  Ao Root diam: 3.30 cm   MITRAL VALVE  MV Area (PHT): 3.65 cm  SHUNTS  MV PHT:    60.32 msec      Systemic VTI: 0.18 m  MV Decel Time: 208 msec       Systemic Diam: 2.00 cm  MV E velocity: 92.10 cm/s 103 cm/s  MV A velocity: 74.60 cm/s 70.3 cm/s  MV E/A ratio: 1.23    1.5    Antimicrobials:  Anti-infectives (From admission, onward)   Start     Dose/Rate Route Frequency Ordered Stop   09/16/19 2200  azithromycin (ZITHROMAX) 500 mg in sodium chloride 0.9 % 250 mL IVPB  Status:  Discontinued    Note to Pharmacy: Last dose at [facility] 1/30 ~0100   500 mg 250 mL/hr over 60 Minutes Intravenous Every 24 hours 09/16/19 0458 09/17/19 1632   09/16/19 0500  cefTRIAXone (ROCEPHIN) 1 g in sodium chloride 0.9 % 100 mL IVPB  Status:  Discontinued    Note to Pharmacy: Due for dose now, last dose at [facility] 1/29 ~0130   1 g 200 mL/hr over 30 Minutes Intravenous Every 24 hours 09/16/19 0458 09/17/19 1632     Subjective: Seen And examined at bedside and he feels okay with his shortness of breath perspective.  His main complaint was that he had some phlegm caught in his throat.  No chest pain, lightheadedness or dizziness.  Wearing 2 L of supplemental oxygen via nasal cannula.  No other  concerns or complaints at this time and nephrology talk to him about dialysis and he does not know how he feels about this..  Objective: Vitals:   09/18/19 0455 09/18/19 0500 09/18/19 1037 09/18/19 1059  BP: 101/81   132/72  Pulse: 82   62  Resp: 19   18  Temp: 97.7 F (36.5 C)   97.7 F (36.5 C)  TempSrc: Oral   Oral  SpO2: 98%   100%  Weight:  65.8 kg 63.5 kg   Height:        Intake/Output Summary (Last 24 hours) at 09/18/2019 1527 Last data filed at 09/18/2019 1500 Gross per 24 hour  Intake 674 ml  Output 1886 ml  Net -1212 ml   Filed Weights   09/16/19 1459 09/18/19 0500 09/18/19 1037  Weight: 65.3 kg 65.8 kg 63.5 kg   Examination: Physical Exam:  Constitutional: Thin elderly frail Caucasian male currently in no acute distress sitting in a chair bedside appears calm Eyes: Lids and conjunctivae normal, sclerae anicteric  ENMT: External Ears, Nose appear normal. Grossly normal hearing. Mucous membranes are moist.  Neck: Appears normal, supple, no cervical masses, normal ROM, no appreciable thyromegaly; no appreciable JVD Respiratory: Diminished to auscultation bilaterally with some coarse breath sounds and some mild crackles, no wheezing, rales, rhonchi. Normal respiratory effort and patient is not tachypenic. No accessory muscle use.  Unlabored breathing but is wearing 2 L supplemental oxygen via nasal cannula Cardiovascular: RRR, is at least a 3 out of 6 systolic murmur.  No appreciable lower extremity edema noted Abdomen: Soft, non-tender, non-distended. Bowel sounds positive.  GU: Deferred. Musculoskeletal: No clubbing / cyanosis of digits/nails. No joint deformity upper and lower extremities.  Skin: No rashes, lesions, ulcers on limited skin evaluation. No induration; Warm and dry.  Neurologic: CN 2-12 grossly intact with no focal deficits. Romberg sign and cerebellar reflexes not assessed.  Psychiatric: Normal judgment and insight. Alert and oriented x 3. Normal mood and  appropriate affect.   Data Reviewed: I have personally reviewed following labs and imaging studies  CBC: Recent Labs  Lab 09/16/19  [id number] 09/17/19 0538 09/18/19 0252  WBC 12.0* 11.8* 11.2*  NEUTROABS  --   --  8.4*  HGB 9.5* 9.2* 9.2*  HCT 29.4* 27.8* 29.0*  MCV 98.3 97.5 101.8*  PLT 114* 112* 123XX123*   Basic Metabolic Panel: Recent Labs  Lab 09/16/19 0517 09/16/19 1352 09/17/19 0538 09/18/19 0252  NA 139 139 140 141  K 5.0 4.8 4.3 4.3  CL 105 106 106 107  CO2 15* 15* 17* 16*  GLUCOSE 128* 119* 129* 133*  BUN 106* 111* 121* 136*  CREATININE 5.81* 5.94* 5.80* 5.67*  CALCIUM 8.2* 8.1* 8.0* 8.3*  MG  --   --   --  2.0  PHOS  --  8.4* 7.8* 7.5*   GFR: Estimated Creatinine Clearance: 8.1 mL/min (A) (by C-G formula based on SCr of 5.67 mg/dL (H)). Liver Function Tests: Recent Labs  Lab 09/16/19 0517 09/16/19 1352 09/17/19 0538 09/18/19 0252  AST 56*  --   --  21  ALT 36  --   --  21  ALKPHOS 44  --   --  52  BILITOT 0.7  --   --  0.3  PROT 6.2*  --   --  6.1*  ALBUMIN 3.3* 3.3* 3.2* 3.3*   No results for input(s): LIPASE, AMYLASE in the last 168 hours. No results for input(s): AMMONIA in the last 168 hours. Coagulation Profile: No results for input(s): INR, PROTIME in the last 168 hours. Cardiac Enzymes: No results for input(s): CKTOTAL, CKMB, CKMBINDEX, TROPONINI in the last 168 hours. BNP (last 3 results) No results for input(s): PROBNP in the last 8760 hours. HbA1C: No results for input(s): HGBA1C in the last 72 hours. CBG: No results for input(s): GLUCAP in the last 168 hours. Lipid Profile: No results for input(s): CHOL, HDL, LDLCALC, TRIG, CHOLHDL, LDLDIRECT in the last 72 hours. Thyroid Function Tests: No results for input(s): TSH, T4TOTAL, FREET4, T3FREE, THYROIDAB in the last 72 hours. Anemia Panel: Recent Labs    09/17/19 0538  FERRITIN 118  TIBC 213*  IRON 40*   Sepsis Labs: Recent Labs  Lab 09/16/19 0517  PROCALCITON 0.85    Recent  Results (from the past 240 hour(s))  SARS CORONAVIRUS 2 (TAT 6-24 HRS) Nasopharyngeal Nasopharyngeal Swab     Status: None   Collection Time: 09/16/19  4:13 AM   Specimen: Nasopharyngeal Swab  Result Value Ref Range Status   SARS Coronavirus 2 NEGATIVE NEGATIVE Final    Comment: (NOTE) SARS-CoV-2 target nucleic acids are NOT DETECTED. The SARS-CoV-2 RNA is generally detectable in upper and lower respiratory specimens during the acute phase of infection. Negative results do not preclude SARS-CoV-2 infection, do not rule out co-infections with other pathogens, and should not be used as the sole basis for treatment or other patient management decisions. Negative results must be combined with clinical observations, patient history, and epidemiological information. The expected result is Negative. Fact Sheet for Patients: SugarRoll.be Fact Sheet for Healthcare Providers: https://www.woods-mathews.com/ This test is not yet approved or cleared by the [place] FDA and  has been authorized for detection and/or diagnosis of SARS-CoV-2 by FDA under an Emergency Use Authorization (EUA). This EUA will remain  in effect (meaning this test can be used) for the duration of the COVID-19 declaration under Section 56 4(b)(1) of the Act, 21 U.S.C. section 360bbb-3(b)(1), unless the authorization is terminated or revoked sooner. Performed at [facility], [place] [address] [place], [place] [address]   Culture, blood (routine x  2)     Status: None (Preliminary result)   Collection Time: 09/16/19  5:20 AM   Specimen: BLOOD  Result Value Ref Range Status   Specimen Description BLOOD LEFT HAND  Final   Special Requests   Final    BOTTLES DRAWN AEROBIC ONLY Blood Culture results may not be optimal due to an inadequate volume of blood received in culture bottles   Culture   Final    NO GROWTH 2 DAYS Performed at [facility], [place] [address],  [place], [place] [address]    Report Status PENDING  Incomplete  Culture, blood (routine x 2)     Status: None (Preliminary result)   Collection Time: 09/16/19  5:26 AM   Specimen: BLOOD  Result Value Ref Range Status   Specimen Description BLOOD LEFT HAND  Final   Special Requests   Final    BOTTLES DRAWN AEROBIC ONLY Blood Culture adequate volume   Culture   Final    NO GROWTH 2 DAYS Performed at [facility], [place] [address], [place], [place] [address]    Report Status PENDING  Incomplete     RN Pressure Injury Documentation:     Estimated body mass index is 20.09 kg/m as calculated from the following:   Height as of this encounter: 5\' 10"  (1.778 m).   Weight as of this encounter: 63.5 kg.  Malnutrition Type:  Nutrition Problem: Increased nutrient needs Etiology: chronic illness, acute illness(CKD IV, CHF (EF 35-40%) admitted with pulmonary edema)   Malnutrition Characteristics:  Signs/Symptoms: estimated needs   Nutrition Interventions:  Interventions: MVI, Nepro shake   Radiology Studies: DG CHEST PORT 1 VIEW  Result Date: 09/18/2019 CLINICAL DATA:  Shortness of breath. EXAM: PORTABLE CHEST 1 VIEW COMPARISON:  09/16/2019 FINDINGS: The heart is borderline enlarged. Stable tortuosity and calcification of the thoracic aorta. Persistent central vascular congestion and pulmonary edema. No focal infiltrates or large effusions. Prominent skin fold noted over the right upper chest. No pneumothorax. IMPRESSION: Persistent changes of CHF. Electronically Signed   By: Marijo Sanes M.D.   On: 09/18/2019 05:51   ECHOCARDIOGRAM COMPLETE  Result Date: 09/17/2019   ECHOCARDIOGRAM REPORT   Patient Name:   Tanner Pearson Date of Exam: 09/17/2019 Medical Rec #:  [id number]      Height:       70.0 in Accession #:    [id number]     Weight:       143.9 lb Date of Birth:  1929/04/09     BSA:          1.81 m Patient Age:    62 years       BP:           117/90 mmHg Patient Gender: M               HR:           82 bpm. Exam Location:  Inpatient Procedure: 2D Echo Indications:    CHF 428  History:        Patient has prior history of Echocardiogram examinations, most                 recent 03/23/2018. CHF, CAD; Risk Factors:Hypertension and                 Dyslipidemia.  Sonographer:    Jannett Celestine RDCS (AE) Referring Phys: [id number] VASUNDHRA [place]  1. Left ventricular ejection fraction, by visual estimation, is <20%. The  left ventricle has severely decreased function. There is no left ventricular hypertrophy.  2. Definity contrast agent was given IV to delineate the left ventricular endocardial borders.  3. Elevated left atrial pressure.  4. Left ventricular diastolic parameters are consistent with Grade II diastolic dysfunction (pseudonormalization).  5. Moderately dilated left ventricular internal cavity size.  6. The left ventricle demonstrates global hypokinesis.  7. Global right ventricle has moderately reduced systolic function.The right ventricular size is mildly enlarged. No increase in right ventricular wall thickness.  8. Left atrial size was severely dilated.  9. Right atrial size was normal. 10. The mitral valve is abnormal. Mild mitral valve regurgitation. 11. The tricuspid valve is grossly normal. 12. The tricuspid valve is grossly normal. Tricuspid valve regurgitation is mild. 13. The aortic valve is abnormal. Aortic valve regurgitation is not visualized. 14. AV is thickence, calcified with restricted motion Peak and mean gradients through the valve are 24 and 15 mm Hg respectively LVOT/AV VTI 0.35 consistent with moderate AS. 15. The pulmonic valve was not well visualized. Pulmonic valve regurgitation is not visualized. 16. The inferior vena cava is dilated in size with >50% respiratory variability, suggesting right atrial pressure of 8 mmHg. 17. The interatrial septum was not well visualized. FINDINGS  Left Ventricle: Left ventricular ejection fraction, by visual estimation, is  <20%. The left ventricle has severely decreased function. Definity contrast agent was given IV to delineate the left ventricular endocardial borders. The left ventricle demonstrates global hypokinesis. The left ventricular internal cavity size was moderately dilated left ventricle. There is no left ventricular hypertrophy. Left ventricular diastolic parameters are consistent with Grade II diastolic dysfunction (pseudonormalization). Elevated left atrial pressure. Right Ventricle: The right ventricular size is mildly enlarged. No increase in right ventricular wall thickness. Global RV systolic function is has moderately reduced systolic function. Left Atrium: Left atrial size was severely dilated. Right Atrium: Right atrial size was normal in size Pericardium: There is no evidence of pericardial effusion. Mitral Valve: The mitral valve is abnormal. There is mild thickening of the mitral valve leaflet(s). Mild mitral valve regurgitation. Tricuspid Valve: The tricuspid valve is grossly normal. Tricuspid valve regurgitation is mild. Aortic Valve: The aortic valve is abnormal. Aortic valve regurgitation is not visualized. Aortic valve mean gradient measures 15.0 mmHg. Aortic valve peak gradient measures 24.2 mmHg. Aortic valve area, by VTI measures 1.09 cm. AV is thickence, calcified with restricted motion Peak and mean gradients through the valve are 24 and 15 mm Hg respectively LVOT/AV VTI 0.35 consistent with moderate AS. Pulmonic Valve: The pulmonic valve was not well visualized. Pulmonic valve regurgitation is not visualized. Pulmonic regurgitation is not visualized. Aorta: The aortic root and ascending aorta are structurally normal, with no evidence of dilitation. Venous: The inferior vena cava is dilated in size with greater than 50% respiratory variability, suggesting right atrial pressure of 8 mmHg. IAS/Shunts: The interatrial septum was not well visualized.  LEFT VENTRICLE PLAX 2D LVIDd:         6.00 cm   Diastology LVIDs:         5.20 cm  LV e' lateral:   6.74 cm/s LV PW:         1.10 cm  LV E/e' lateral: 13.7 LV IVS:        1.00 cm  LV e' medial:    3.15 cm/s LVOT diam:     2.00 cm  LV E/e' medial:  29.2 LV SV:         50 ml  LV SV Index:   28.21 LVOT Area:     3.14 cm  RIGHT VENTRICLE RV S prime:     7.94 cm/s TAPSE (M-mode): 1.5 cm LEFT ATRIUM             Index       RIGHT ATRIUM           Index LA diam:        4.00 cm 2.20 cm/m  RA Area:     12.00 cm LA Vol (A2C):   83.6 ml 46.07 ml/m RA Volume:   25.40 ml  14.00 ml/m LA Vol (A4C):   89.8 ml 49.48 ml/m LA Biplane Vol: 86.6 ml 47.72 ml/m  AORTIC VALVE AV Area (Vmax):    1.20 cm AV Area (Vmean):   1.20 cm AV Area (VTI):     1.09 cm AV Vmax:           246.00 cm/s AV Vmean:          183.000 cm/s AV VTI:            0.526 m AV Peak Grad:      24.2 mmHg AV Mean Grad:      15.0 mmHg LVOT Vmax:         93.80 cm/s LVOT Vmean:        69.800 cm/s LVOT VTI:          0.183 m LVOT/AV VTI ratio: 0.35  AORTA Ao Root diam: 3.30 cm MITRAL VALVE MV Area (PHT): 3.65 cm             SHUNTS MV PHT:        60.32 msec           Systemic VTI:  0.18 m MV Decel Time: 208 msec             Systemic Diam: 2.00 cm MV E velocity: 92.10 cm/s 103 cm/s MV A velocity: 74.60 cm/s 70.3 cm/s MV E/A ratio:  1.23       1.5  Dorris Carnes MD Electronically signed by Dorris Carnes MD Signature Date/Time: 09/17/2019/11:15:27 AM    Final    Scheduled Meds: . aspirin EC  81 mg Oral Daily  . atorvastatin  80 mg Oral q1800  . B-complex with vitamin C  1 tablet Oral Daily  . feeding supplement (NEPRO CARB STEADY)  237 mL Oral Q24H  . guaiFENesin  1,200 mg Oral BID  . heparin injection (subcutaneous)  5,000 Units Subcutaneous Q8H   Continuous Infusions: . furosemide      LOS: 2 days   Kerney Elbe, DO [facility] PAGER is on AMION  If 7PM-7AM, please contact night-coverage [email or web address]

## 2019-09-18 NOTE — Plan of Care (Signed)
  Problem: Education: Goal: Knowledge of risk factors and measures for prevention of condition will improve Outcome: Not Applicable   Problem: Coping: Goal: Psychosocial and spiritual needs will be supported Outcome: Not Applicable   Problem: Respiratory: Goal: Will maintain a patent airway Outcome: Not Applicable Goal: Complications related to the disease process, condition or treatment will be avoided or minimized Outcome: Not Applicable

## 2019-09-18 NOTE — Progress Notes (Signed)
ANTICOAGULATION CONSULT NOTE - Follow Up Consult  Pharmacy Consult for Heparin Indication: chest pain/ACS  No Known Allergies  Patient Measurements: Height: 5\' 10"  (177.8 cm) Weight: 145 lb 1 oz (65.8 kg) IBW/kg (Calculated) : 73 Heparin Dosing Weight: 65 kg  Vital Signs: Temp: 97.7 F (36.5 C) (02/01 0455) Temp Source: Oral (02/01 0455) BP: 101/81 (02/01 0455) Pulse Rate: 82 (02/01 0455)  Labs: Recent Labs    09/16/19 0517 09/16/19 0517 09/16/19 0956 09/16/19 1352 09/16/19 1352 09/16/19 2211 09/17/19 0538 09/18/19 0252  HGB 9.5*   < >  --   --   --   --  9.2* 9.2*  HCT 29.4*  --   --   --   --   --  27.8* 29.0*  PLT 114*  --   --   --   --   --  112* 117*  HEPARINUNFRC  --   --   --  0.23*   < > 0.27* 0.44 0.55  CREATININE 5.81*   < >  --  5.94*  --   --  5.80* 5.67*  TROPONINIHS 12,172*  --  11,574*  --   --   --   --   --    < > = values in this interval not displayed.    Estimated Creatinine Clearance: 8.4 mL/min (A) (by C-G formula based on SCr of 5.67 mg/dL (H)).  Assessment:  Anticoag: Elevated troponin. IV heparin drip 950 uts/hr HL 0.55 at goal. CBC low but stable. No overt bleeding or infusion issues noted. - VQ scan done at [facility] low risk for PE.   Goal of Therapy:  Heparin level 0.3-0.7 units/ml Monitor platelets by anticoagulation protocol: Yes   Plan:  Continue IV heparin at 950 units/hr Daily HL and CBC Monitor for signs of bleeding If no plans for further workup, could heparin be stopped?  (started ~ 8 am 1/30; so currently about 48 hrs of rx).  Marguerite Olea, [facility] Clinical Pharmacist Phone [phone]  09/18/2019 8:33 AM

## 2019-09-19 ENCOUNTER — Inpatient Hospital Stay (HOSPITAL_COMMUNITY): Payer: Medicare Other

## 2019-09-19 DIAGNOSIS — Z515 Encounter for palliative care: Secondary | ICD-10-CM

## 2019-09-19 DIAGNOSIS — Z7189 Other specified counseling: Secondary | ICD-10-CM

## 2019-09-19 LAB — CBC WITH DIFFERENTIAL/PLATELET
Abs Immature Granulocytes: 0.05 10*3/uL (ref 0.00–0.07)
Basophils Absolute: 0 10*3/uL (ref 0.0–0.1)
Basophils Relative: 0 %
Eosinophils Absolute: 0.1 10*3/uL (ref 0.0–0.5)
Eosinophils Relative: 1 %
HCT: 30.9 % — ABNORMAL LOW (ref 39.0–52.0)
Hemoglobin: 10 g/dL — ABNORMAL LOW (ref 13.0–17.0)
Immature Granulocytes: 1 %
Lymphocytes Relative: 14 %
Lymphs Abs: 1.4 10*3/uL (ref 0.7–4.0)
MCH: 32.4 pg (ref 26.0–34.0)
MCHC: 32.4 g/dL (ref 30.0–36.0)
MCV: 100 fL (ref 80.0–100.0)
Monocytes Absolute: 0.7 10*3/uL (ref 0.1–1.0)
Monocytes Relative: 7 %
Neutro Abs: 7.8 10*3/uL — ABNORMAL HIGH (ref 1.7–7.7)
Neutrophils Relative %: 77 %
Platelets: 119 10*3/uL — ABNORMAL LOW (ref 150–400)
RBC: 3.09 MIL/uL — ABNORMAL LOW (ref 4.22–5.81)
RDW: 14.8 % (ref 11.5–15.5)
WBC: 10.1 10*3/uL (ref 4.0–10.5)
nRBC: 0.2 % (ref 0.0–0.2)

## 2019-09-19 LAB — COMPREHENSIVE METABOLIC PANEL
ALT: 24 U/L (ref 0–44)
AST: 27 U/L (ref 15–41)
Albumin: 3.4 g/dL — ABNORMAL LOW (ref 3.5–5.0)
Alkaline Phosphatase: 50 U/L (ref 38–126)
Anion gap: 18 — ABNORMAL HIGH (ref 5–15)
BUN: 138 mg/dL — ABNORMAL HIGH (ref 8–23)
CO2: 19 mmol/L — ABNORMAL LOW (ref 22–32)
Calcium: 8.5 mg/dL — ABNORMAL LOW (ref 8.9–10.3)
Chloride: 103 mmol/L (ref 98–111)
Creatinine, Ser: 5.69 mg/dL — ABNORMAL HIGH (ref 0.61–1.24)
GFR calc Af Amer: 9 mL/min — ABNORMAL LOW (ref >60)
GFR calc non Af Amer: 8 mL/min — ABNORMAL LOW (ref >60)
Glucose, Bld: 128 mg/dL — ABNORMAL HIGH (ref 70–99)
Potassium: 4.9 mmol/L (ref 3.5–5.1)
Sodium: 140 mmol/L (ref 135–145)
Total Bilirubin: 0.5 mg/dL (ref 0.3–1.2)
Total Protein: 6.2 g/dL — ABNORMAL LOW (ref 6.5–8.1)

## 2019-09-19 LAB — PHOSPHORUS: Phosphorus: 7.7 mg/dL — ABNORMAL HIGH (ref 2.5–4.6)

## 2019-09-19 LAB — MAGNESIUM: Magnesium: 2.2 mg/dL (ref 1.7–2.4)

## 2019-09-19 MED ORDER — ISOSORB DINITRATE-HYDRALAZINE 20-37.5 MG PO TABS
0.5000 | ORAL_TABLET | Freq: Three times a day (TID) | ORAL | Status: DC
  Administered 2019-09-19 (×3): 0.5 via ORAL
  Filled 2019-09-19 (×4): qty 1

## 2019-09-19 NOTE — Consult Note (Signed)
Consultation Note Date: 09/19/19  Patient Name: Tanner Pearson  DOB: 09-02-1928  MRN: [id number]  Age / Sex: 86 y.o., male  PCP: Tanner Kiel, MD Referring Physician: Cristal Ford, DO  Reason for Consultation: Establishing goals of care  HPI/Patient Profile: 86 y.o. male  with past medical history of CKD stage IV followed by Dr. Lorrene Pearson with right forearm AV fistula (never started dialysis), CHF with EF 35-40% from ECHO in 2014-11-18, HTN, HLD, PAD, anemia, GERD, gout admitted on 09/16/2019 with shortness of breath from [facility] ED. Reports orthopnea. Hospital admission for acute hypoxemic respiratory failure secondary to pulmonary edema in the setting of acute on chronic combined CHF EF <20%, worsening CKD, aortic stenosis. Aggressively diuresing. Nephrology and cardiology following. Patient is a poor candidate for hemodialysis. Palliative medicine consultation for goals of care.   Clinical Assessment and Goals of Care:  I have reviewed medical records, discussed with care team, and initially spoke with daughter Tanner Pearson) via telephone to discuss goals of care. Tanner Pearson is unfortunately covid positive and in quarantine, therefore unable to meet with PMT provider and her father at bedside.   I introduced Palliative Medicine as specialized medical care for people living with serious illness. It focuses on providing relief from the symptoms and stress of a serious illness. The goal is to improve quality of life for both the patient and the family.  We discussed a brief life review of the patient. Widowed. Wife died in 17-Nov-2016. Prior to admission, living alone and independent. Three adult children that check in frequently throughout the week. 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 She states he has lived a "beautiful life."   She shares that his  kidneys have been bad for many years, and he even had fistula placement >10 years ago. Both his brother and sister died from kidney failure. She states "never, ever wanted dialysis if he cold help it." Patient had a similar episode of worsening kidney function/heart failure exacerbation in 11/18/14 and he 'leveled out.'  Discussed events leading up to admission and course of hospitalization including diagnoses, interventions, plan of care, and tenuous clinical condition. Tanner Pearson understands that he is a poor dialysis candidate and do not feel this will impact his quality of life. Tanner Pearson understands heart function is worsening with EF <20%. She understands that this has been "balancing act."   I attempted to elicit values and goals of care important to the patient/daughter. Tanner Pearson shares that his independence and quality of life are most important. He doesn't want to be a burden. She would wish for him to be as 'comfortable' as possible.   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 She is very familiar with palliative/hospice and EOL care. She has briefly spoke to her father about hospice, and he did not seem ready for hospice services when they last discussed.   Regardless, Tanner Pearson shares that her father is of sound mind and her and siblings  will honor his wishes. Reassured Tanner Pearson that I will go discuss Tanner Pearson with her father and contact her again this afternoon.    **Shortly after, met with patient at bedside to discuss [place]. He is awake, alert, oriented and able to participate in conversation.  Therapeutic listening as he shares a life review.   Discussed in detail course of hospitalization including diagnoses, interventions, plan of care. Patient understands he is a poor candidate for dialysis with worsening heart failure. Discussed medical management.   Advanced directives, concepts specific to code status, artifical feeding and hydration were discussed. Introduced and discussed MOST form and  educated on medical recommendation for DNR/DNI with underlying age, frailty, chronic conditions, and poor chances of survival with CPR. Patient nodding head 'no' during this part of discussion. He shares that he will further discuss MOST form with his children this evening and we plan to further discuss tomorrow. Patient requests I call and update Tanner Pearson.   Questions and concerns were addressed. Reassured of ongoing support from palliative.    **Spoke with daughter again this afternoon. Updated her on my conversation with her father. Reassured of f/u tomorrow after they have a chance to further discuss goals of care.    SUMMARY OF RECOMMENDATIONS    Continue current plan of care and medical management.  Patient/daughter understand he is a poor candidate for dialysis.   Ongoing palliative discussions with patient and children. Patient plans to discuss MOST form with children this evening. Leaning against heroic measures at EOL. Educated on medical recommendation for DNR/DNI and patient plans to further discuss with children.   Requested copy of patient's advance directive.   PMT provider will f/u 2/3  Code Status/Advance Care Planning:  Full code  Symptom Management:   Per attending  Palliative Prophylaxis:   Aspiration, Bowel Regimen, Delirium Protocol, Frequent Pain Assessment and Oral Care  Psycho-social/Spiritual:   Desire for further Chaplaincy support: yes  Additional Recommendations: Caregiving  Support/Resources, Compassionate Wean Education and Education on Hospice  Prognosis:   Poor long-term prognosis  Discharge Planning: To Be Determined      Primary Diagnoses: Present on Admission: . Pulmonary edema . Chronic kidney disease (CKD), stage IV (severe) ([place]) . Essential hypertension   I have reviewed the medical record, interviewed the patient and family, and examined the patient. The following aspects are pertinent.  Past Medical History:  Diagnosis Date   . Acute on chronic combined systolic (congestive) and diastolic (congestive) heart failure ([place])   . CAD (coronary artery disease)   . Chronic kidney disease (CKD), stage IV (severe) ([place])   . Dyslipidemia   . Hyperlipidemia   . Hypertension    Social History   Socioeconomic History  . Marital status: Married    Spouse name: Not on file  . Number of children: Not on file  . Years of education: Not on file  . Highest education level: Not on file  Occupational History  . Not on file  Tobacco Use  . Smoking status: Former Research scientist (life sciences)  . Smokeless tobacco: Never Used  Substance and Sexual Activity  . Alcohol use: Yes    Comment: occasional beer  . Drug use: No  . Sexual activity: Not on file  Other Topics Concern  . Not on file  Social History Narrative  . Not on file   Social Determinants of Health   Financial Resource Strain:   . Difficulty of Paying Living Expenses: Not on file  Food Insecurity:   . Worried About Running  Out of Food in the Last Year: Not on file  . Ran Out of Food in the Last Year: Not on file  Transportation Needs:   . Lack of Transportation (Medical): Not on file  . Lack of Transportation (Non-Medical): Not on file  Physical Activity:   . Days of Exercise per Week: Not on file  . Minutes of Exercise per Session: Not on file  Stress:   . Feeling of Stress : Not on file  Social Connections:   . Frequency of Communication with Friends and Family: Not on file  . Frequency of Social Gatherings with Friends and Family: Not on file  . Attends Religious Services: Not on file  . Active Member of Clubs or Organizations: Not on file  . Attends Archivist Meetings: Not on file  . Marital Status: Not on file   Family History  Problem Relation Age of Onset  . Polycystic kidney disease Brother   . Polycystic kidney disease Sister   . Hypertension Mother   . Stroke Mother    Scheduled Meds: . aspirin EC  81 mg Oral Daily  . atorvastatin  80 mg  Oral q1800  . B-complex with vitamin C  1 tablet Oral Daily  . clopidogrel  75 mg Oral Daily  . feeding supplement (NEPRO CARB STEADY)  237 mL Oral Q24H  . guaiFENesin  1,200 mg Oral BID  . heparin injection (subcutaneous)  5,000 Units Subcutaneous Q8H  . isosorbide-hydrALAZINE  0.5 tablet Oral TID   Continuous Infusions: . furosemide 100 mg (09/20/19 0616)   PRN Meds:.acetaminophen **OR** acetaminophen Medications Prior to Admission:  Prior to Admission medications   Medication Sig Start Date End Date Taking? Authorizing Provider  allopurinol (ZYLOPRIM) 100 MG tablet Take 100 mg by mouth daily.   Yes [provider]  aspirin EC 81 MG tablet Take 81 mg by mouth daily.   Yes [provider]  carvedilol (COREG) 25 MG tablet Take 1 tablet (25 mg total) by mouth 2 (two) times daily. Patient taking differently: Take 25 mg by mouth at bedtime.  09/05/19  Yes Park Liter, MD  furosemide (LASIX) 40 MG tablet Take 40 mg by mouth daily. 09/06/19  Yes [provider]  isosorbide-hydrALAZINE (BIDIL) 20-37.5 MG tablet Take 1 tablet by mouth 3 (three) times daily. 09/05/19  Yes Park Liter, MD  pravastatin (PRAVACHOL) 40 MG tablet Take 1 tablet (40 mg total) by mouth at bedtime. 09/05/19  Yes Park Liter, MD   No Known Allergies Review of Systems  Constitutional: Positive for activity change and fatigue.  Respiratory: Positive for shortness of breath.    Physical Exam Vitals and nursing note reviewed.  Constitutional:      General: He is awake.  HENT:     Head: Normocephalic and atraumatic.  Cardiovascular:     Rate and Rhythm: Regular rhythm.     Heart sounds: Murmur present.  Pulmonary:     Effort: No tachypnea, accessory muscle usage or respiratory distress.     Breath sounds: Normal breath sounds.  Abdominal:     Tenderness: There is no abdominal tenderness.  Skin:    General: Skin is warm and dry.  Neurological:     Mental Status: He  is alert and oriented to person, place, and time.  Psychiatric:        Attention and Perception: Attention normal.        Mood and Affect: Mood normal.  Speech: Speech normal.        Behavior: Behavior normal.        Cognition and Memory: Cognition normal.     Vital Signs: BP (!) 100/58 (BP Location: Left Arm)   Pulse 88   Temp 97.6 F (36.4 C) (Oral)   Resp 18   Ht '5\' 10"'  (1.778 m)   Wt 61.5 kg   SpO2 99%   BMI 19.46 kg/m  Pain Scale: 0-10   Pain Score: 0-No pain   SpO2: SpO2: 99 % O2 Device:SpO2: 99 % O2 Flow Rate: .O2 Flow Rate (L/min): 2 L/min  IO: Intake/output summary:   Intake/Output Summary (Last 24 hours) at 09/20/2019 0912 Last data filed at 09/20/2019 2248 Gross per 24 hour  Intake 1938 ml  Output 1800 ml  Net 138 ml    LBM: Last BM Date: 09/18/19 Baseline Weight: Weight: 64.8 kg Most recent weight: Weight: 61.5 kg     Palliative Assessment/Data: PPS 50%   Flowsheet Rows     Most Recent Value  Intake Tab  Referral Department  Hospitalist  Unit at Time of Referral  Cardiac/Telemetry Unit  Palliative Care Primary Diagnosis  Other (Comment) [CHF/CKD]  Palliative Care Type  New Palliative care  Reason for referral  Clarify Goals of Care  Date first seen by Palliative Care  09/19/19  Clinical Assessment  Palliative Performance Scale Score  50%  Psychosocial & Spiritual Assessment  Palliative Care Outcomes  Patient/Family meeting held?  Yes  Who was at the meeting?  patient, spoke with daughter via telephone  Palliative Care Outcomes  Clarified goals of care, Provided end of life care assistance, Provided psychosocial or spiritual support, ACP counseling assistance, Linked to palliative care logitudinal support      Time In/Out: 0930-1000, 2500-3704 Time Total: 120 Greater than 50%  of this time was spent counseling and coordinating care related to the above assessment and plan.  Signed by:  Ihor Dow, DNP, FNP-C Palliative Medicine Team   Phone: [phone] Fax: [phone]   Please contact Palliative Medicine Team phone at [phone] for questions and concerns.  For individual provider: See Shea Evans

## 2019-09-19 NOTE — Progress Notes (Signed)
Progress Note  Patient Name: Tanner Pearson Date of Encounter: 09/19/2019  Primary Cardiologist: Jenne Campus, MD   Subjective   More comfortable, lying at 20 deg HOB elevation. Improved diuresis, now -ve 3.1L since admission. BUN and Creatinine have not changed since yesterday. Weight down about 3 lb since admission.  Inpatient Medications    Scheduled Meds: . aspirin EC  81 mg Oral Daily  . atorvastatin  80 mg Oral q1800  . B-complex with vitamin C  1 tablet Oral Daily  . clopidogrel  75 mg Oral Daily  . feeding supplement (NEPRO CARB STEADY)  237 mL Oral Q24H  . guaiFENesin  1,200 mg Oral BID  . heparin injection (subcutaneous)  5,000 Units Subcutaneous Q8H   Continuous Infusions: . furosemide 100 mg (09/19/19 0555)   PRN Meds: acetaminophen **OR** acetaminophen   Vital Signs    Vitals:   09/18/19 1059 09/18/19 2008 09/19/19 0549 09/19/19 0557  BP: 132/72 119/68 117/63   Pulse: 62 84 84   Resp: 18 18    Temp: 97.7 F (36.5 C) 98.2 F (36.8 C) 97.7 F (36.5 C)   TempSrc: Oral Oral Oral   SpO2: 100% 100% 100%   Weight:    63.7 kg  Height:        Intake/Output Summary (Last 24 hours) at 09/19/2019 [id number] Last data filed at 09/19/2019 0558 Gross per 24 hour  Intake 420 ml  Output 2106 ml  Net -1686 ml   Last 3 Weights 09/19/2019 09/18/2019 09/18/2019  Weight (lbs) 140 lb 6.9 oz 139 lb 15.9 oz 145 lb 1 oz  Weight (kg) 63.7 kg 63.5 kg 65.8 kg      Telemetry    NSR - Personally Reviewed  ECG    No new tracing - Personally Reviewed  Physical Exam  Elderly, frail GEN: No acute distress.   Neck: No JVD Cardiac: RRR, mid-peak Ao ej murmur, no diastolic murmurs, rubs, or gallops.  Respiratory: Clear to auscultation bilaterally. GI: Soft, nontender, non-distended  MS: No edema; No deformity. Neuro:  Nonfocal  Psych: Normal affect   Labs    High Sensitivity Troponin:   Recent Labs  Lab 09/16/19 0517 09/16/19 0956  TROPONINIHS 12,172* 11,574*       Chemistry Recent Labs  Lab 09/16/19 0517 09/16/19 1352 09/17/19 0538 09/18/19 0252 09/19/19 0415  NA 139   < > 140 141 140  K 5.0   < > 4.3 4.3 4.9  CL 105   < > 106 107 103  CO2 15*   < > 17* 16* 19*  GLUCOSE 128*   < > 129* 133* 128*  BUN 106*   < > 121* 136* 138*  CREATININE 5.81*   < > 5.80* 5.67* 5.69*  CALCIUM 8.2*   < > 8.0* 8.3* 8.5*  PROT 6.2*  --   --  6.1* 6.2*  ALBUMIN 3.3*   < > 3.2* 3.3* 3.4*  AST 56*  --   --  21 27  ALT 36  --   --  21 24  ALKPHOS 44  --   --  52 50  BILITOT 0.7  --   --  0.3 0.5  GFRNONAA 8*   < > 8* 8* 8*  GFRAA 9*   < > 9* 10* 9*  ANIONGAP 19*   < > 17* 18* 18*   < > = values in this interval not displayed.     Hematology Recent Labs  Lab 09/17/19 [id number]  09/18/19 0252 09/19/19 0415  WBC 11.8* 11.2* 10.1  RBC 2.85* 2.85* 3.09*  HGB 9.2* 9.2* 10.0*  HCT 27.8* 29.0* 30.9*  MCV 97.5 101.8* 100.0  MCH 32.3 32.3 32.4  MCHC 33.1 31.7 32.4  RDW 14.8 14.9 14.8  PLT 112* 117* 119*    BNPNo results for input(s): BNP, PROBNP in the last 168 hours.   DDimer No results for input(s): DDIMER in the last 168 hours.   Radiology    DG CHEST PORT 1 VIEW  Result Date: 09/19/2019 CLINICAL DATA:  Shortness of breath EXAM: PORTABLE CHEST 1 VIEW COMPARISON:  Yesterday FINDINGS: Mild cardiomegaly is stable. Stable haziness of the bilateral chest from layering pleural fluid and atelectasis. Interstitial edema is improved. IMPRESSION: Improved interstitial edema with persistent haziness from layering pleural fluid and atelectasis by recent CT. Electronically Signed   By: Monte Fantasia M.D.   On: 09/19/2019 06:29   DG CHEST PORT 1 VIEW  Result Date: 09/18/2019 CLINICAL DATA:  Shortness of breath. EXAM: PORTABLE CHEST 1 VIEW COMPARISON:  09/16/2019 FINDINGS: The heart is borderline enlarged. Stable tortuosity and calcification of the thoracic aorta. Persistent central vascular congestion and pulmonary edema. No focal infiltrates or large effusions.  Prominent skin fold noted over the right upper chest. No pneumothorax. IMPRESSION: Persistent changes of CHF. Electronically Signed   By: Marijo Sanes M.D.   On: 09/18/2019 05:51   ECHOCARDIOGRAM COMPLETE  Result Date: 09/17/2019   ECHOCARDIOGRAM REPORT   Patient Name:   CLENTON JARBO Date of Exam: 09/17/2019 Medical Rec #:  [id number]      Height:       70.0 in Accession #:    [id number]     Weight:       143.9 lb Date of Birth:  Jul 10, 1929     BSA:          1.81 m Patient Age:    86 years       BP:           117/90 mmHg Patient Gender: M              HR:           82 bpm. Exam Location:  Inpatient Procedure: 2D Echo Indications:    CHF 428  History:        Patient has prior history of Echocardiogram examinations, most                 recent 03/23/2018. CHF, CAD; Risk Factors:Hypertension and                 Dyslipidemia.  Sonographer:    Jannett Celestine RDCS (AE) Referring Phys: [id number] VASUNDHRA Kinsman  1. Left ventricular ejection fraction, by visual estimation, is <20%. The left ventricle has severely decreased function. There is no left ventricular hypertrophy.  2. Definity contrast agent was given IV to delineate the left ventricular endocardial borders.  3. Elevated left atrial pressure.  4. Left ventricular diastolic parameters are consistent with Grade II diastolic dysfunction (pseudonormalization).  5. Moderately dilated left ventricular internal cavity size.  6. The left ventricle demonstrates global hypokinesis.  7. Global right ventricle has moderately reduced systolic function.The right ventricular size is mildly enlarged. No increase in right ventricular wall thickness.  8. Left atrial size was severely dilated.  9. Right atrial size was normal. 10. The mitral valve is abnormal. Mild mitral valve regurgitation. 11. The tricuspid valve is grossly normal. 12. The tricuspid valve is grossly  normal. Tricuspid valve regurgitation is mild. 13. The aortic valve is abnormal. Aortic valve  regurgitation is not visualized. 14. AV is thickence, calcified with restricted motion Peak and mean gradients through the valve are 24 and 15 mm Hg respectively LVOT/AV VTI 0.35 consistent with moderate AS. 15. The pulmonic valve was not well visualized. Pulmonic valve regurgitation is not visualized. 16. The inferior vena cava is dilated in size with >50% respiratory variability, suggesting right atrial pressure of 8 mmHg. 17. The interatrial septum was not well visualized. FINDINGS  Left Ventricle: Left ventricular ejection fraction, by visual estimation, is <20%. The left ventricle has severely decreased function. Definity contrast agent was given IV to delineate the left ventricular endocardial borders. The left ventricle demonstrates global hypokinesis. The left ventricular internal cavity size was moderately dilated left ventricle. There is no left ventricular hypertrophy. Left ventricular diastolic parameters are consistent with Grade II diastolic dysfunction (pseudonormalization). Elevated left atrial pressure. Right Ventricle: The right ventricular size is mildly enlarged. No increase in right ventricular wall thickness. Global RV systolic function is has moderately reduced systolic function. Left Atrium: Left atrial size was severely dilated. Right Atrium: Right atrial size was normal in size Pericardium: There is no evidence of pericardial effusion. Mitral Valve: The mitral valve is abnormal. There is mild thickening of the mitral valve leaflet(s). Mild mitral valve regurgitation. Tricuspid Valve: The tricuspid valve is grossly normal. Tricuspid valve regurgitation is mild. Aortic Valve: The aortic valve is abnormal. Aortic valve regurgitation is not visualized. Aortic valve mean gradient measures 15.0 mmHg. Aortic valve peak gradient measures 24.2 mmHg. Aortic valve area, by VTI measures 1.09 cm. AV is thickence, calcified with restricted motion Peak and mean gradients through the valve are 24 and 15  mm Hg respectively LVOT/AV VTI 0.35 consistent with moderate AS. Pulmonic Valve: The pulmonic valve was not well visualized. Pulmonic valve regurgitation is not visualized. Pulmonic regurgitation is not visualized. Aorta: The aortic root and ascending aorta are structurally normal, with no evidence of dilitation. Venous: The inferior vena cava is dilated in size with greater than 50% respiratory variability, suggesting right atrial pressure of 8 mmHg. IAS/Shunts: The interatrial septum was not well visualized.  LEFT VENTRICLE PLAX 2D LVIDd:         6.00 cm  Diastology LVIDs:         5.20 cm  LV e' lateral:   6.74 cm/s LV PW:         1.10 cm  LV E/e' lateral: 13.7 LV IVS:        1.00 cm  LV e' medial:    3.15 cm/s LVOT diam:     2.00 cm  LV E/e' medial:  29.2 LV SV:         50 ml LV SV Index:   28.21 LVOT Area:     3.14 cm  RIGHT VENTRICLE RV S prime:     7.94 cm/s TAPSE (M-mode): 1.5 cm LEFT ATRIUM             Index       RIGHT ATRIUM           Index LA diam:        4.00 cm 2.20 cm/m  RA Area:     12.00 cm LA Vol (A2C):   83.6 ml 46.07 ml/m RA Volume:   25.40 ml  14.00 ml/m LA Vol (A4C):   89.8 ml 49.48 ml/m LA Biplane Vol: 86.6 ml 47.72 ml/m  AORTIC VALVE AV Area (  Vmax):    1.20 cm AV Area (Vmean):   1.20 cm AV Area (VTI):     1.09 cm AV Vmax:           246.00 cm/s AV Vmean:          183.000 cm/s AV VTI:            0.526 m AV Peak Grad:      24.2 mmHg AV Mean Grad:      15.0 mmHg LVOT Vmax:         93.80 cm/s LVOT Vmean:        69.800 cm/s LVOT VTI:          0.183 m LVOT/AV VTI ratio: 0.35  AORTA Ao Root diam: 3.30 cm MITRAL VALVE MV Area (PHT): 3.65 cm             SHUNTS MV PHT:        60.32 msec           Systemic VTI:  0.18 m MV Decel Time: 208 msec             Systemic Diam: 2.00 cm MV E velocity: 92.10 cm/s 103 cm/s MV A velocity: 74.60 cm/s 70.3 cm/s MV E/A ratio:  1.23       1.5  Dorris Carnes MD Electronically signed by Dorris Carnes MD Signature Date/Time: 09/17/2019/11:15:27 AM    Final     Cardiac  Studies   1. Left ventricular ejection fraction, by visual estimation, is <20%. The  left ventricle has severely decreased function. There is no left  ventricular hypertrophy.  2. Definity contrast agent was given IV to delineate the left ventricular  endocardial borders.  3. Elevated left atrial pressure.  4. Left ventricular diastolic parameters are consistent with Grade II  diastolic dysfunction (pseudonormalization).  5. Moderately dilated left ventricular internal cavity size.  6. The left ventricle demonstrates global hypokinesis.  7. Global right ventricle has moderately reduced systolic function.The  right ventricular size is mildly enlarged. No increase in right  ventricular wall thickness.  8. Left atrial size was severely dilated.  9. Right atrial size was normal.  10. The mitral valve is abnormal. Mild mitral valve regurgitation.  11. The tricuspid valve is grossly normal.  12. The tricuspid valve is grossly normal. Tricuspid valve regurgitation  is mild.  13. The aortic valve is abnormal. Aortic valve regurgitation is not  visualized.  14. AV is thickence, calcified with restricted motion Peak and mean  gradients through the valve are 24 and 15 mm Hg respectively LVOT/AV VTI  0.35 consistent with moderate AS.  15. The pulmonic valve was not well visualized. Pulmonic valve  regurgitation is not visualized.  16. The inferior vena cava is dilated in size with >50% respiratory  variability, suggesting right atrial pressure of 8 mmHg.  17. The interatrial septum was not well visualized.   Patient Profile     86 y.o. male with CAD (details unclear, with NSTEMI 07/2015 managed medically), ischemic cardiomyopathy/chronic combined CHF, hypertension, hyperlipidemia, chronic appearing anemia, and stage IV-VCKD who was admitted to [facility] from [facility] with increasing SOB, found to have elevated BNP, d-dimer, Cr of 5.8, and elevated troponin. 2D Echo 09/17/2019 which  revealed EF is now less than 20%, grade 2 DD, mild MR, severe LAE, moderate aortic stenosis (EF previously 35-40%).   Assessment & Plan    1. Acute on chronic combined CHF with worsening cardiomyopathy: continue IV diuretics per Nephrology, restart low  dose Bidil; will add back carvedilol only once we decide to switch to PO diuretics. Conservative management.  2. AKI superimposed on CKD stage IV-V - baseline Cr reported 3.8-4, peak 5.94, slowly improving - 5.6 today. Nephrology following. Pt was not sure about HD and wanted time to think about it. Daughter is a Merchandiser, retail. In my opinion, he will do poorly with HD and a palliative care approach appears appropriate.  3. Reported history of CAD with NSTEMI (here with new LBBB as well): not a candidate for invasive evaluation or PCI. Continue ASA, clopidogrel, statin. He does not have angina.  4. Essential HTN: resume low dose Bidil.  5. Hyperlipidemia: switched to atorvastatin.  6. Chronic anemia: normo/macrocytic indices, likely due to renal failure.  7. Moderate AS: gradients underestimate severity due to low EF, but non-severe AS; dimensionless index 0.35, squarely in moderate range, not likely to be a cause of the worsening CMP.      For questions or updates, please contact [place] Please consult [email or web address] for contact info under        Signed, Sanda Klein, MD  09/19/2019, 8:22 AM

## 2019-09-19 NOTE — Progress Notes (Signed)
Tanner Pearson Progress Note    Assessment/ Plan:    SOB/ pulm edema - looks like vol overload in CKD patient. UOP is picking up fortunately.  Daughter is hospice nurse for hospice home in [place], her number is [phone] and she would appreciate updates (at home w/ +COVID).  I told pt that dialysis would likely turn him into a professional patient and not contribute to QOL.  Appreciate palliative c/s.  Continue Lasix as is--> I decreased the dose slightly yesterday d/t rising azotemia and is stable today.    AKI on CKD IV - baseline creat 3.8- 4. Get clinic records monday.UA and US unremarkable. Not uremic as above.   HTN - agree w/ holding home coreg and hydralazine, BP's  normal   HL  H/o CAD  H/o combined syst/ diast CHF  Subjective:    Cr and BUN basically the same.  Talking with palliative care.  D/w primary nephrologist (Dr [name]) yesterday, pt had expressed to her at that time that dialysis would not be in his wishes.      Objective:   BP 95/64 (BP Location: Left Arm)   Pulse 81   Temp (!) 97.5 F (36.4 C) (Oral)   Resp 18   Ht 5\' 10"  (1.778 m)   Wt 63.7 kg   SpO2 100%   BMI 20.15 kg/m   Intake/Output Summary (Last 24 hours) at 09/19/2019 1501 Last data filed at 09/19/2019 1409 Gross per 24 hour  Intake 540 ml  Output 1895 ml  Net -1355 ml   Weight change: -2.3 kg  Physical Exam: Gen: NAD, sitting in bed CVS: RRR Resp: bibasilar crackles persist but improved Abd: soft Ext: no real LE edema  Imaging: DG CHEST PORT 1 VIEW  Result Date: 09/19/2019 CLINICAL DATA:  Shortness of breath EXAM: PORTABLE CHEST 1 VIEW COMPARISON:  Yesterday FINDINGS: Mild cardiomegaly is stable. Stable haziness of the bilateral chest from layering pleural fluid and atelectasis. Interstitial edema is improved. IMPRESSION: Improved interstitial edema with persistent haziness from layering pleural fluid and atelectasis by recent CT. Electronically Signed   By: Monte Fantasia M.D.   On: 09/19/2019 06:29   DG CHEST PORT 1 VIEW  Result Date: 09/18/2019 CLINICAL DATA:  Shortness of breath. EXAM: PORTABLE CHEST 1 VIEW COMPARISON:  09/16/2019 FINDINGS: The heart is borderline enlarged. Stable tortuosity and calcification of the thoracic aorta. Persistent central vascular congestion and pulmonary edema. No focal infiltrates or large effusions. Prominent skin fold noted over the right upper chest. No pneumothorax. IMPRESSION: Persistent changes of CHF. Electronically Signed   By: Marijo Sanes M.D.   On: 09/18/2019 05:51    Labs: BMET Recent Labs  Lab 09/16/19 0517 09/16/19 1352 09/17/19 0538 09/18/19 0252 09/19/19 0415  NA 139 139 140 141 140  K 5.0 4.8 4.3 4.3 4.9  CL 105 106 106 107 103  CO2 15* 15* 17* 16* 19*  GLUCOSE 128* 119* 129* 133* 128*  BUN 106* 111* 121* 136* 138*  CREATININE 5.81* 5.94* 5.80* 5.67* 5.69*  CALCIUM 8.2* 8.1* 8.0* 8.3* 8.5*  PHOS  --  8.4* 7.8* 7.5* 7.7*   CBC Recent Labs  Lab 09/16/19 0517 09/17/19 0538 09/18/19 0252 09/19/19 0415  WBC 12.0* 11.8* 11.2* 10.1  NEUTROABS  --   --  8.4* 7.8*  HGB 9.5* 9.2* 9.2* 10.0*  HCT 29.4* 27.8* 29.0* 30.9*  MCV 98.3 97.5 101.8* 100.0  PLT 114* 112* 117* 119*    Medications:    .  aspirin EC  81 mg Oral Daily  . atorvastatin  80 mg Oral q1800  . B-complex with vitamin C  1 tablet Oral Daily  . clopidogrel  75 mg Oral Daily  . feeding supplement (NEPRO CARB STEADY)  237 mL Oral Q24H  . guaiFENesin  1,200 mg Oral BID  . heparin injection (subcutaneous)  5,000 Units Subcutaneous Q8H  . isosorbide-hydrALAZINE  0.5 tablet Oral TID      Tanner Lips, MD 09/19/2019, 3:01 PM

## 2019-09-19 NOTE — Progress Notes (Signed)
PMT consult received and chart reviewed. Extensive time spent with patient and daughter via telephone discussing goals of care today. Continue current plan of care and medical management. Patient/daughter understand he is a poor candidate for hemodialysis and long-term poor prognosis with underlying irreversible conditions that are progressing. Discussed in detail MOST form including recommendation for DNR/DNI code status. Patient plans to further discuss with his son this afternoon at bedside and with daughter via telephone. Patient has documented living will. Requested copy from family for EMR. PMT provider plans to f/u with patient/family tomorrow to further discuss goals and possibly completion of MOST form.   Full palliative note to follow.   NO CHARGE  Ihor Dow, [place], FNP-C Palliative Medicine Team  Phone: [phone] Fax: [phone]

## 2019-09-19 NOTE — Plan of Care (Signed)

## 2019-09-19 NOTE — Progress Notes (Signed)
PROGRESS NOTE    Tanner Pearson  [id number] DOB: Feb 04, 1929 DOA: 09/16/2019 PCP: Ernestene Kiel, MD  Brief Narrative:  HPI per Dr. Shela Leff on 09/16/2019 Tanner Pearson is a 86 y.o. male with medical history significant of CKD stage IV followed by Dr. [name] and has a right forearm AV fistula in place but not started dialysis yet, hypertension, hyperlipidemia, CHF with echo done in April 2016 EF 35 to 40%, PAD, anemia, GERD, gout presented to [facility] ED on 1/29 for evaluation of shortness of breath.  Patient reports 3-day history of shortness of breath and cough.  Also reports orthopnea.  No lower extremity edema.  Denies fevers, chills, fatigue, body aches, vomiting, abdominal pain, or diarrhea.  States he saw his daughter a week ago and she recently tested positive for COVID-19.  ED course at [facility]:  EMS reported oxygen saturation 95% on room air. Tachycardic and tachypneic in the ED, blood pressure 156/78.  Patient was placed on 2 L supplemental oxygen due to increased work of breathing and tachypnea.  SARS-CoV-2 PCR test negative, influenza panel negative WBC count 14.6, hemoglobin 10.4, MCV 99, platelet count 149 Procalcitonin 0.08 Sodium 140, potassium 4.9, chloride 111, bicarb 14, anion gap 20, BUN 88, creatinine 5.4, glucose 247 Lactic acid 3.5 >2.1 after IV fluid LFTs normal A1c 5.5 BNP significantly elevated at 38,600 D-dimer elevated at 3393 Troponin 0.10 >0.26 >0.69 >9.5.  EKG with LBBB, no recent tracing for comparison. FOBT negative Blood cultures pending  Chest x-ray showing bilateral patchy opacities concerning for pneumonia. CT chest without contrast showing mild cardiomegaly with moderate bilateral pleural effusions, diffuse interlobular septal thickening and redistribution of the pulmonary vascularity and peribronchovascular cuffing.  Findings favored to reflect a combination of CHF and pulmonary edema.  Superimposed multifocal pneumonia also a  consideration given multifocal regions of peripheral consolidation.  Also showing mild thoracic esophageal mural thickening, outpatient endoscopy recommended for further evaluation.  Extensive coronary artery calcifications.  Aortic atherosclerosis.  Cholelithiasis without findings of acute cholecystitis.  Mild bilateral perinephric stranding.  VQ scan low probability for PE.  Patient was treated with ceftriaxone and azithromycin.  Patient was given 2 L IV fluid per sepsis protocol in the ED.  He was started on bicarb drip for metabolic acidosis.  Also started on heparin infusion  **Interim History Patient had a repeat echocardiogram was found to have acute on chronic combined systolic and diastolic CHF with worsening EF of <20%and worsening diastolic dysfunction.  Cardiology is consulted for further evaluation recommendations.  He has an AKI on CKD stage IV.  Nephrology is aggressively diuresing with 100 mg of IV Lasix every 6h and decreasing to q8h. Palliative Care consulted for further evaluation and GOC discussion.   Assessment & Plan:   Principal Problem:   Pulmonary edema Active Problems:   Essential hypertension   Chronic kidney disease (CKD), stage IV (severe) (HCC)   CHF exacerbation (HCC)   AKI (acute kidney injury) ([place])  Acute Hypoxemic Respiratory Failure with Pulmonary Edema in the setting of acute on chronic combined systolic and diastolic CHF as well as likely due to worsening/progression of CKD in setting of acute illness. Aortic stenosis may have also progressed, moderate to severe on 2019 echocardiogram, improving  -Initially thought to have CAP, currently on azithromycin (day 3/3) and ceftriaxone (day 3 of 7).  But will stop as his chest x-ray is more consistent with fluid volume overload as he is afebrile and has a minimal leukocytosis -PCT was 0.85 but  in the setting of renal failure and less likely infection -For pulmonary edema, furosemide ordered per Nephrology,  appreciate recommendations -COVID negative X2--transfer to alternate unit  - Wean oxygen goal O2 sat >92% - Incentive spirometry -Follow up blood cultures showed no growth to date at 3 day -Repeat chest x-ray today showed "Improved interstitial edema with persistent haziness from layering pleural fluid and atelectasis by recent CT." -Continue diuresis per nephro -SpO2: 100 % O2 Flow Rate (L/min): 2 L/min -Wean O2 as tolerated -Continue supplemental oxygen via nasal cannula and maintain O2 saturations greater than 90% -We will need an ambulatory home O2 screen prior to discharge next-cardiology consulted for further evaluation and he is currently not a candidate for any invasive studies due to risk for contrast-induced nephropathy given his recent worsening of his renal function and cardiology believes that his cardiac catheterization will not likely offer him any significant benefit either -Do not clinically feel that his quality of life would improve if he is to go on dialysis and daughter agrees -We will consult palliative care for goals of care discussion  Acute on chronic combined systolic and diastolic CHF; worsened -Likely contributed heavily to the patient's shortness of breath and acute pulmonary edema -EF is down to 20% and echo showed worsening grade 2 diastolic dysfunction next-also has aortic stenosis  -Echo results as below -Cardiology recommending that he is not a candidate for any ACE inhibitors or ARB therapy or well spironolactone given his renal function -His carvedilol 25 mg p.o. twice daily and he takes BiDil 20-7.5 mg 3 times daily; audiology is planning to add back vital low-dose and we will add back carvedilol once they decide to switch to p.o. diuretics -Cardiology recommending that when his heart rate becomes more stable consider adding back his carvedilol with caution not to drop his blood pressure and only change to p.o. diuretics -Further management per  cardiology -Strict I's and O's, daily weights -Not really made much urine but is currently - 3,141 mL since admission  -Currently getting heavily diuresed with 100 mg of IV Lasix every 6 hours but this has been changed to 100 mg IV Lasix every 8 by nephrology -Continue to monitor for signs and symptoms of volume overload and repeat chest x-ray in a.m.  Acute kidney injury on CKD IV Metabolic acidosis -baseline creatinine ~ 4 at last value in system - Nephrology consulted, appreciate recommendations -Renal ultrasound showed Findings of chronic medical renal disease. No evidence of Hydronephrosis. Small bilateral renal cysts.  No renal masses identified. Diffuse bladder wall thickening, likely due to chronic bladder outlet obstruction given enlarged prostate" -Lytes done to check FeNa -Patient had a metabolic acidosis on admission with a CO2 of 15, chloride level of 106, and an anion gap of 18; repeat this morning shows a CO2 of 19, and anion gap of 18, and a chloride level of 103 - Strict I/O, bladder scans - Avoid nephrotoxic agents, contrast dyes, hypotension if possible and renally adjust medications -Patient's urinalysis showed a hazy appearance with straw-colored urine, small hemoglobin, negative nitrites, negative leukocytes negative ketones, rare bacteria, -BUNs/creatinine went from 111/5.94 -> 121/5.80 -> 136/5.67 -> 138/5.69 -Continue to monitor and trend renal function and further care per nephrology as he is getting diuresed with 100 mg of Lasix IV every 8 hours now  -Nephrology broached the subject of dialysis however clinically I do not feel that his quality of life would benefit if he was to go on dialysis and patient is undecided about  dialysis -We will consult palliative care for further goals of care discussion and Palliative to Meet with the patient and Family   NSTEMI type II, likely due to demand ischemia in setting of acute illness as well as recent acute on chronic  combined CHF, AKI  - Denies chest pain or anginal symptoms today  - Echocardiogram ordered and is below -Cardiology formally consulted for further evaluation -Continue with Telemetry -Patient's high-sensitivity troponin was 12,172 on admission and repeat was 11,574 -Repeat EKG ordered -Restarted ASA 81 mg and Atorvastatin 80 mg po Daily   -Heparin drip has now been stopped by cardiology and patient was started on Plavix and will continue   Aortic stenosis -Caution with excess diuresis/fluids given moderate to severe -Echocardiogram as below -Cardiology consulted for further evaluation  HLD - Pravastatin  HTN -Held Carvedilol and BiDil for now given his lower blood pressures -Etiology is now resuming BiDil low-dose will resume carvedilol once he is changed to p.o. diuretics  CAD -Patient denies any prior stent placement -Continue ASA and Atorvastatin and now adding Plavix  -His beta-blockers currently has as above  Normocytic anemia/anemia of chronic kidney disease -Iron studies was done and showed a iron level of 40, U IBC 173, TIBC of 213, saturation ratio is 19%, ferritin of 118 -Patient's hemoglobin/hematocrit is now stable at 10.0/30.9 -Continue to monitor for signs and symptoms of bleeding as he is now anticoagulated with a heparin drip -Repeat CBC in a.m.  Leukocytosis, improving  -Likely reactive in the setting of above next-currently do not clinically feel has any signs of infection -WBC went from 11.8 -> 11.2 -> 10.1 -Continue monitor and trend and repeat CBC in a.m.  Thrombocytopenia -Patient's Platelet Count went from 114 -> 112 -> 117 -> 119 -Continue to Monitor for S/Sx of Bleeding -Repeat CBC in AM   DVT prophylaxis: Heparin 5,000 units sq q8h Code Status: FULL CODE  Family Communication: Discussed with Daughter Engineer, manufacturing systems) via the Telephone Disposition Plan: Patient from home and will need PT OT evaluation as well as cardiac and nephrology clearance for a  safe discharge disposition  Consultants:   Nephrology  Cardiology   Palliative Care Medicine   Procedures:  ECHOCARDIOGRAM IMPRESSIONS    1. Left ventricular ejection fraction, by visual estimation, is <20%. The  left ventricle has severely decreased function. There is no left  ventricular hypertrophy.  2. Definity contrast agent was given IV to delineate the left ventricular  endocardial borders.  3. Elevated left atrial pressure.  4. Left ventricular diastolic parameters are consistent with Grade II  diastolic dysfunction (pseudonormalization).  5. Moderately dilated left ventricular internal cavity size.  6. The left ventricle demonstrates global hypokinesis.  7. Global right ventricle has moderately reduced systolic function.The  right ventricular size is mildly enlarged. No increase in right  ventricular wall thickness.  8. Left atrial size was severely dilated.  9. Right atrial size was normal.  10. The mitral valve is abnormal. Mild mitral valve regurgitation.  11. The tricuspid valve is grossly normal.  12. The tricuspid valve is grossly normal. Tricuspid valve regurgitation  is mild.  13. The aortic valve is abnormal. Aortic valve regurgitation is not  visualized.  14. AV is thickence, calcified with restricted motion Peak and mean  gradients through the valve are 24 and 15 mm Hg respectively LVOT/AV VTI  0.35 consistent with moderate AS.  15. The pulmonic valve was not well visualized. Pulmonic valve  regurgitation is not visualized.  16. The inferior  vena cava is dilated in size with >50% respiratory  variability, suggesting right atrial pressure of 8 mmHg.  17. The interatrial septum was not well visualized.   FINDINGS  Left Ventricle: Left ventricular ejection fraction, by visual estimation,  is <20%. The left ventricle has severely decreased function. Definity  contrast agent was given IV to delineate the left ventricular endocardial  borders.  The left ventricle  demonstrates global hypokinesis. The left ventricular internal cavity size  was moderately dilated left ventricle. There is no left ventricular  hypertrophy. Left ventricular diastolic parameters are consistent with  Grade II diastolic dysfunction  (pseudonormalization). Elevated left atrial pressure.   Right Ventricle: The right ventricular size is mildly enlarged. No  increase in right ventricular wall thickness. Global RV systolic function  is has moderately reduced systolic function.   Left Atrium: Left atrial size was severely dilated.   Right Atrium: Right atrial size was normal in size   Pericardium: There is no evidence of pericardial effusion.   Mitral Valve: The mitral valve is abnormal. There is mild thickening of  the mitral valve leaflet(s). Mild mitral valve regurgitation.   Tricuspid Valve: The tricuspid valve is grossly normal. Tricuspid valve  regurgitation is mild.   Aortic Valve: The aortic valve is abnormal. Aortic valve regurgitation is  not visualized. Aortic valve mean gradient measures 15.0 mmHg. Aortic  valve peak gradient measures 24.2 mmHg. Aortic valve area, by VTI measures  1.09 cm. AV is thickence,  calcified with restricted motion Peak and mean gradients through the valve  are 24 and 15 mm Hg respectively LVOT/AV VTI 0.35 consistent with moderate  AS.   Pulmonic Valve: The pulmonic valve was not well visualized. Pulmonic valve  regurgitation is not visualized. Pulmonic regurgitation is not visualized.   Aorta: The aortic root and ascending aorta are structurally normal, with  no evidence of dilitation.   Venous: The inferior vena cava is dilated in size with greater than 50%  respiratory variability, suggesting right atrial pressure of 8 mmHg.   IAS/Shunts: The interatrial septum was not well visualized.     LEFT VENTRICLE  PLAX 2D  LVIDd:     6.00 cm Diastology  LVIDs:     5.20 cm LV e' lateral:  6.74 cm/s   LV PW:     1.10 cm LV E/e' lateral: 13.7  LV IVS:    1.00 cm LV e' medial:  3.15 cm/s  LVOT diam:   2.00 cm LV E/e' medial: 29.2  LV SV:     50 ml  LV SV Index:  28.21  LVOT Area:   3.14 cm     RIGHT VENTRICLE  RV S prime:   7.94 cm/s  TAPSE (M-mode): 1.5 cm   LEFT ATRIUM       Index    RIGHT ATRIUM      Index  LA diam:    4.00 cm 2.20 cm/m RA Area:   12.00 cm  LA Vol (A2C):  83.6 ml 46.07 ml/m RA Volume:  25.40 ml 14.00 ml/m  LA Vol (A4C):  89.8 ml 49.48 ml/m  LA Biplane Vol: 86.6 ml 47.72 ml/m  AORTIC VALVE  AV Area (Vmax):  1.20 cm  AV Area (Vmean):  1.20 cm  AV Area (VTI):   1.09 cm  AV Vmax:      246.00 cm/s  AV Vmean:     183.000 cm/s  AV VTI:      0.526 m  AV Peak Grad:  24.2 mmHg  AV Mean Grad:   15.0 mmHg  LVOT Vmax:     93.80 cm/s  LVOT Vmean:    69.800 cm/s  LVOT VTI:     0.183 m  LVOT/AV VTI ratio: 0.35    AORTA  Ao Root diam: 3.30 cm   MITRAL VALVE  MV Area (PHT): 3.65 cm       SHUNTS  MV PHT:    60.32 msec      Systemic VTI: 0.18 m  MV Decel Time: 208 msec       Systemic Diam: 2.00 cm  MV E velocity: 92.10 cm/s 103 cm/s  MV A velocity: 74.60 cm/s 70.3 cm/s  MV E/A ratio: 1.23    1.5    Antimicrobials:  Anti-infectives (From admission, onward)   Start     Dose/Rate Route Frequency Ordered Stop   09/16/19 2200  azithromycin (ZITHROMAX) 500 mg in sodium chloride 0.9 % 250 mL IVPB  Status:  Discontinued    Note to Pharmacy: Last dose at [facility] 1/30 ~0100   500 mg 250 mL/hr over 60 Minutes Intravenous Every 24 hours 09/16/19 0458 09/17/19 1632   09/16/19 0500  cefTRIAXone (ROCEPHIN) 1 g in sodium chloride 0.9 % 100 mL IVPB  Status:  Discontinued    Note to Pharmacy: Due for dose now, last dose at [facility] 1/29 ~0130   1 g 200 mL/hr over 30 Minutes Intravenous Every 24 hours 09/16/19 0458 09/17/19 1632      Subjective: Seen And examined at bedside and he states that he is feeling better than he did yesterday.  No chest pain, lightheadedness or dizziness.  Denies any nausea or vomiting.  No other concerns or complaints at this time but continues to wear 2 L of supplemental oxygen via nasal cannula.  Objective: Vitals:   09/19/19 0549 09/19/19 0557 09/19/19 1027 09/19/19 1110  BP: 117/63  114/61 95/64  Pulse: 84  85 81  Resp:   20 18  Temp: 97.7 F (36.5 C)  (!) 97.4 F (36.3 C) (!) 97.5 F (36.4 C)  TempSrc: Oral  Oral Oral  SpO2: 100%  100% 100%  Weight:  63.7 kg    Height:        Intake/Output Summary (Last 24 hours) at 09/19/2019 1507 Last data filed at 09/19/2019 1409 Gross per 24 hour  Intake 540 ml  Output 1895 ml  Net -1355 ml   Filed Weights   09/18/19 0500 09/18/19 1037 09/19/19 0557  Weight: 65.8 kg 63.5 kg 63.7 kg   Examination: Physical Exam:  Constitutional: Thin elderly frail Caucasian male currently no acute distress appears calm Eyes: Lids and conjunctivae normal, sclerae anicteric  ENMT: External Ears, Nose appear normal. Grossly normal hearing. Mucous membranes are moist.  Neck: Appears normal, supple, no cervical masses, normal ROM, no appreciable thyromegaly; no JVD Respiratory: Diminished to auscultation bilaterally with some coarse breath sounds and crackles, no wheezing, rales, rhonchi. Normal respiratory effort and patient is not tachypenic. No accessory muscle use.  Wearing 2 L of supplemental oxygen via nasal cannula Cardiovascular: RRR, no murmurs / rubs / gallops. S1 and S2 auscultated. No extremity edema. 2+ pedal pulses. No carotid bruits.  Abdomen: Soft, non-tender, non-distended. Bowel sounds positive.  GU: Deferred. Musculoskeletal: No clubbing / cyanosis of digits/nails. No joint deformity upper and lower extremities.  Skin: No rashes, lesions, ulcers on a limited skin evaluation. No induration; Warm and dry.  Neurologic: CN 2-12 grossly intact  with no focal deficits. Romberg  sign and cerebellar reflexes not assessed.  Psychiatric: Normal judgment and insight. Alert and oriented x 3.  Pleasant and normal mood and appropriate affect.   Data Reviewed: I have personally reviewed following labs and imaging studies  CBC: Recent Labs  Lab 09/16/19 0517 09/17/19 0538 09/18/19 0252 09/19/19 0415  WBC 12.0* 11.8* 11.2* 10.1  NEUTROABS  --   --  8.4* 7.8*  HGB 9.5* 9.2* 9.2* 10.0*  HCT 29.4* 27.8* 29.0* 30.9*  MCV 98.3 97.5 101.8* 100.0  PLT 114* 112* 117* [id number]*   Basic Metabolic Panel: Recent Labs  Lab 09/16/19 0517 09/16/19 1352 09/17/19 0538 09/18/19 0252 09/19/19 0415  NA 139 139 140 141 140  K 5.0 4.8 4.3 4.3 4.9  CL 105 106 106 107 103  CO2 15* 15* 17* 16* 19*  GLUCOSE 128* 119* 129* 133* 128*  BUN 106* 111* 121* 136* 138*  CREATININE 5.81* 5.94* 5.80* 5.67* 5.69*  CALCIUM 8.2* 8.1* 8.0* 8.3* 8.5*  MG  --   --   --  2.0 2.2  PHOS  --  8.4* 7.8* 7.5* 7.7*   GFR: Estimated Creatinine Clearance: 8.1 mL/min (A) (by C-G formula based on SCr of 5.69 mg/dL (H)). Liver Function Tests: Recent Labs  Lab 09/16/19 0517 09/16/19 1352 09/17/19 0538 09/18/19 0252 09/19/19 0415  AST 56*  --   --  21 27  ALT 36  --   --  21 24  ALKPHOS 44  --   --  52 50  BILITOT 0.7  --   --  0.3 0.5  PROT 6.2*  --   --  6.1* 6.2*  ALBUMIN 3.3* 3.3* 3.2* 3.3* 3.4*   No results for input(s): LIPASE, AMYLASE in the last 168 hours. No results for input(s): AMMONIA in the last 168 hours. Coagulation Profile: No results for input(s): INR, PROTIME in the last 168 hours. Cardiac Enzymes: No results for input(s): CKTOTAL, CKMB, CKMBINDEX, TROPONINI in the last 168 hours. BNP (last 3 results) No results for input(s): PROBNP in the last 8760 hours. HbA1C: No results for input(s): HGBA1C in the last 72 hours. CBG: No results for input(s): GLUCAP in the last 168 hours. Lipid Profile: No results for input(s): CHOL, HDL, LDLCALC, TRIG,  CHOLHDL, LDLDIRECT in the last 72 hours. Thyroid Function Tests: No results for input(s): TSH, T4TOTAL, FREET4, T3FREE, THYROIDAB in the last 72 hours. Anemia Panel: Recent Labs    09/17/19 0538  FERRITIN 118  TIBC 213*  IRON 40*   Sepsis Labs: Recent Labs  Lab 09/16/19 0517  PROCALCITON 0.85    Recent Results (from the past 240 hour(s))  SARS CORONAVIRUS 2 (TAT 6-24 HRS) Nasopharyngeal Nasopharyngeal Swab     Status: None   Collection Time: 09/16/19  4:13 AM   Specimen: Nasopharyngeal Swab  Result Value Ref Range Status   SARS Coronavirus 2 NEGATIVE NEGATIVE Final    Comment: (NOTE) SARS-CoV-2 target nucleic acids are NOT DETECTED. The SARS-CoV-2 RNA is generally detectable in upper and lower respiratory specimens during the acute phase of infection. Negative results do not preclude SARS-CoV-2 infection, do not rule out co-infections with other pathogens, and should not be used as the sole basis for treatment or other patient management decisions. Negative results must be combined with clinical observations, patient history, and epidemiological information. The expected result is Negative. Fact Sheet for Patients: SugarRoll.be Fact Sheet for Healthcare Providers: https://www.woods-mathews.com/ This test is not yet approved or cleared by the [place] FDA and  has been  authorized for detection and/or diagnosis of SARS-CoV-2 by FDA under an Emergency Use Authorization (EUA). This EUA will remain  in effect (meaning this test can be used) for the duration of the COVID-19 declaration under Section 56 4(b)(1) of the Act, 21 U.S.C. section 360bbb-3(b)(1), unless the authorization is terminated or revoked sooner. Performed at [facility], [place] [address], [place], [place] [address]   Culture, blood (routine x 2)     Status: None (Preliminary result)   Collection Time: 09/16/19  5:20 AM   Specimen: BLOOD  Result Value Ref  Range Status   Specimen Description BLOOD LEFT HAND  Final   Special Requests   Final    BOTTLES DRAWN AEROBIC ONLY Blood Culture results may not be optimal due to an inadequate volume of blood received in culture bottles Performed at [facility] [address] [place], [place] [address]    Culture NO GROWTH 3 DAYS  Final   Report Status PENDING  Incomplete  Culture, blood (routine x 2)     Status: None (Preliminary result)   Collection Time: 09/16/19  5:26 AM   Specimen: BLOOD  Result Value Ref Range Status   Specimen Description BLOOD LEFT HAND  Final   Special Requests   Final    BOTTLES DRAWN AEROBIC ONLY Blood Culture adequate volume Performed at [facility], [place] [address] [place], [place] [address]    Culture NO GROWTH 3 DAYS  Final   Report Status PENDING  Incomplete     RN Pressure Injury Documentation:     Estimated body mass index is 20.15 kg/m as calculated from the following:   Height as of this encounter: 5\' 10"  (1.778 m).   Weight as of this encounter: 63.7 kg.  Malnutrition Type:  Nutrition Problem: Increased nutrient needs Etiology: chronic illness, acute illness(CKD IV, CHF (EF 35-40%) admitted with pulmonary edema)   Malnutrition Characteristics:  Signs/Symptoms: estimated needs   Nutrition Interventions:  Interventions: MVI, Nepro shake   Radiology Studies: DG CHEST PORT 1 VIEW  Result Date: 09/19/2019 CLINICAL DATA:  Shortness of breath EXAM: PORTABLE CHEST 1 VIEW COMPARISON:  Yesterday FINDINGS: Mild cardiomegaly is stable. Stable haziness of the bilateral chest from layering pleural fluid and atelectasis. Interstitial edema is improved. IMPRESSION: Improved interstitial edema with persistent haziness from layering pleural fluid and atelectasis by recent CT. Electronically Signed   By: Monte Fantasia M.D.   On: 09/19/2019 06:29   DG CHEST PORT 1 VIEW  Result Date: 09/18/2019 CLINICAL DATA:  Shortness of breath. EXAM: PORTABLE  CHEST 1 VIEW COMPARISON:  09/16/2019 FINDINGS: The heart is borderline enlarged. Stable tortuosity and calcification of the thoracic aorta. Persistent central vascular congestion and pulmonary edema. No focal infiltrates or large effusions. Prominent skin fold noted over the right upper chest. No pneumothorax. IMPRESSION: Persistent changes of CHF. Electronically Signed   By: Marijo Sanes M.D.   On: 09/18/2019 05:51   Scheduled Meds: . aspirin EC  81 mg Oral Daily  . atorvastatin  80 mg Oral q1800  . B-complex with vitamin C  1 tablet Oral Daily  . clopidogrel  75 mg Oral Daily  . feeding supplement (NEPRO CARB STEADY)  237 mL Oral Q24H  . guaiFENesin  1,200 mg Oral BID  . heparin injection (subcutaneous)  5,000 Units Subcutaneous Q8H  . isosorbide-hydrALAZINE  0.5 tablet Oral TID   Continuous Infusions: . furosemide 100 mg (09/19/19 1322)    LOS: 3 days  Cordie Beazley Latif Kazuma Elena, DO [facility] PAGER is on AMION  If 7PM-7AM, please contact night-coverage [email or web address]   

## 2019-09-20 ENCOUNTER — Inpatient Hospital Stay (HOSPITAL_COMMUNITY): Payer: Medicare Other

## 2019-09-20 DIAGNOSIS — Z66 Do not resuscitate: Secondary | ICD-10-CM

## 2019-09-20 LAB — COMPREHENSIVE METABOLIC PANEL
ALT: 21 U/L (ref 0–44)
AST: 18 U/L (ref 15–41)
Albumin: 3.5 g/dL (ref 3.5–5.0)
Alkaline Phosphatase: 50 U/L (ref 38–126)
Anion gap: 19 — ABNORMAL HIGH (ref 5–15)
BUN: 140 mg/dL — ABNORMAL HIGH (ref 8–23)
CO2: 21 mmol/L — ABNORMAL LOW (ref 22–32)
Calcium: 8.5 mg/dL — ABNORMAL LOW (ref 8.9–10.3)
Chloride: 102 mmol/L (ref 98–111)
Creatinine, Ser: 5.41 mg/dL — ABNORMAL HIGH (ref 0.61–1.24)
GFR calc Af Amer: 10 mL/min — ABNORMAL LOW (ref >60)
GFR calc non Af Amer: 9 mL/min — ABNORMAL LOW (ref >60)
Glucose, Bld: 133 mg/dL — ABNORMAL HIGH (ref 70–99)
Potassium: 4.1 mmol/L (ref 3.5–5.1)
Sodium: 142 mmol/L (ref 135–145)
Total Bilirubin: 0.8 mg/dL (ref 0.3–1.2)
Total Protein: 6.4 g/dL — ABNORMAL LOW (ref 6.5–8.1)

## 2019-09-20 LAB — CBC WITH DIFFERENTIAL/PLATELET
Abs Immature Granulocytes: 0.07 10*3/uL (ref 0.00–0.07)
Basophils Absolute: 0 10*3/uL (ref 0.0–0.1)
Basophils Relative: 0 %
Eosinophils Absolute: 0.2 10*3/uL (ref 0.0–0.5)
Eosinophils Relative: 2 %
HCT: 28 % — ABNORMAL LOW (ref 39.0–52.0)
Hemoglobin: 9.4 g/dL — ABNORMAL LOW (ref 13.0–17.0)
Immature Granulocytes: 1 %
Lymphocytes Relative: 15 %
Lymphs Abs: 1.5 10*3/uL (ref 0.7–4.0)
MCH: 33.1 pg (ref 26.0–34.0)
MCHC: 33.6 g/dL (ref 30.0–36.0)
MCV: 98.6 fL (ref 80.0–100.0)
Monocytes Absolute: 0.7 10*3/uL (ref 0.1–1.0)
Monocytes Relative: 7 %
Neutro Abs: 7.5 10*3/uL (ref 1.7–7.7)
Neutrophils Relative %: 75 %
Platelets: 125 10*3/uL — ABNORMAL LOW (ref 150–400)
RBC: 2.84 MIL/uL — ABNORMAL LOW (ref 4.22–5.81)
RDW: 14.6 % (ref 11.5–15.5)
WBC: 9.9 10*3/uL (ref 4.0–10.5)
nRBC: 0.2 % (ref 0.0–0.2)

## 2019-09-20 LAB — PHOSPHORUS: Phosphorus: 8 mg/dL — ABNORMAL HIGH (ref 2.5–4.6)

## 2019-09-20 LAB — MAGNESIUM: Magnesium: 2 mg/dL (ref 1.7–2.4)

## 2019-09-20 MED ORDER — ISOSORB DINITRATE-HYDRALAZINE 20-37.5 MG PO TABS
1.0000 | ORAL_TABLET | Freq: Three times a day (TID) | ORAL | Status: DC
  Administered 2019-09-20 – 2019-09-21 (×6): 1 via ORAL
  Filled 2019-09-20 (×6): qty 1

## 2019-09-20 MED ORDER — TORSEMIDE 100 MG PO TABS
100.0000 mg | ORAL_TABLET | Freq: Two times a day (BID) | ORAL | Status: DC
  Administered 2019-09-20 (×2): 100 mg via ORAL
  Filled 2019-09-20 (×2): qty 1

## 2019-09-20 MED ORDER — NEPRO/CARBSTEADY PO LIQD
237.0000 mL | Freq: Two times a day (BID) | ORAL | Status: DC
  Administered 2019-09-21: 15:00:00 237 mL via ORAL

## 2019-09-20 NOTE — Progress Notes (Signed)
To the best of my knowledge, the student's charting is accurate.  

## 2019-09-20 NOTE — Progress Notes (Signed)
F/u [place] meeting with patient and son at bedside today, 2/3 at 3:30pm. Thank you.  NO CHARGE  Ihor Dow, [place], FNP-C Palliative Medicine Team  Phone: [phone] Fax: [phone]

## 2019-09-20 NOTE — Progress Notes (Signed)
PROGRESS NOTE    Tanner Pearson  [id number] DOB: [birth year] DOA: 09/16/2019 PCP: Ernestene Kiel, MD   Brief Narrative:  HPI On 09/16/2019 by Dr. Shela Leff Tanner Pearson a [id number] y.o.malewith medical history significant ofCKD stageIV followed by Dr. [name] and has a right forearm AV fistula in place but not started dialysis yet, hypertension, hyperlipidemia, CHF with echo done in April 2016 EF 35 to 40%, PAD, anemia, GERD, gout presented to [facility] ED on 1/29 for evaluation of shortness of breath.Patient reports 3-day history of shortness of breath and cough. Also reports orthopnea. No lower extremity edema. Denies fevers, chills, fatigue, body aches, vomiting, abdominal pain, or diarrhea. States he saw his daughter a week ago and she recently tested positive for COVID-19.  Interim history Patient admitted with acute hypoxic respiratory failure with pulmonary edema in the setting of CHF exacerbation with worsening CKD.  Patient found to EF less than 20% and worsening diastolic dysfunction.  Cardiology consulted and appreciated as well as nephrology.  Palliative care also consulted for goals of care discussion. Assessment & Plan   Acute hypoxemic respiratory failure with pulmonary edema  -in setting of CHF exacerbation with worsening CKD -Covid was negative x2 -Initially thought patient had pneumonia and was placed on azithromycin and ceftriaxone however these were discontinued given that chest x-ray is more consistent with volume overload and patient is afebrile with minimal leukocytosis -Was placed on IV Lasix per nephrology -Continue supplemental oxygen, incentive spirometry  Acute on chronic combined systolic and diastolic heart failure -Suspect contributing to patient's shortness of breath and pulmonary edema -EF is down to 20% with worsening grade 2 diastolic dysfunction as well as aortic stenosis -Radiology consulted and appreciated, patient is not a candidate  for ACE inhibitors ARB therapy or spironolactone given renal function -Continue to monitor intake and output, daily weights -Was initially placed on IV Lasix, however has been transitioned to oral torsemide 100 mg twice daily  Acute kidney injury on chronic kidney disease, stage IV/metabolic acidosis -Baseline creatinine approximately 3.8-4 -Nephrology consulted and appreciated -Renal ultrasound showed findings of chronic medical renal disease.  No evidence of hydronephrosis.  Small bilateral renal cysts.  No renal masses identified.  Diffuse bladder wall thickening, likely due to chronic bladder outlet obstruction given enlarged prostate. -UA obtained showing hazy appearance, negative nitrites, negative leukocytes, rare bacteria, negative ketones -Creatinine peaked 5.94, currently 5.41 -Patient has been transitioned to oral torsemide, 100 mg twice daily -Continue to monitor BMP -Of note, patient has declined dialysis.  Palliative care also consulted.  NSTEMI type II -Likely demand ischemia in the setting of acute illness as well as recent acute CHF exacerbation and acute kidney injury -Patient denies any chest pain or anginal symptoms -Echocardiogram as below -Cardiology consulted and appreciated-also noted to have new LBBB, however patient not a candidate for invasive evaluation or PCI -Troponin peaked at 12,000 172 -Continue statin, aspirin -Patient was on heparin drip however this was discontinued by cardiology.  Patient was placed on Plavix  Aortic stenosis -Caution with excessive diuresis/fluids given moderate to severe -Noted on echocardiogram -Cardiology following  Hyperlipidemia -Continue statin  Essential hypertension -BP stable, continue BiDil  Coronary artery disease -Currently no complaints of chest pain -Continue aspirin, statin, Plavix, Bidil  Normocytic anemia/anemia of chronic disease -Hemoglobin currently 9.4, continue to monitor CBC -Anemia panel showed iron  40, adequate storage  Leukocytosis -Resolved, continue to monitor CBC  Thrombocytopenia  -Platelets improving, currently 125 -Continue to monitor CBC  Goals of care -  Given patient's comorbidities with a creatinine >5, NSTEMI, etc, he is a poor candidate for invasive evaluation nor PCI or dialysis at this time. -Palliative care consulted and appreciated, planning for meeting later today with patient's family  DVT Prophylaxis  Heparin  Code Status: Full  Family Communication: None at bedside  Disposition Plan: Admitted.  Suspect patient came from home although was transferred from [facility] ED.  Patient was admitted for CHF exacerbation as well as worsening renal failure.  Pending further recommendations from cardiology, nephrology and palliative care today.  Disposition TBD.  Consultants Cardiology Nephrology Palliative Care  Procedures  Echocardiogram: 1. Left ventricular ejection fraction, by visual estimation, is <20%. The left ventricle has severely decreased function. There is no left ventricular hypertrophy.  2. Definity contrast agent was given IV to delineate the left ventricular endocardial borders.  3. Elevated left atrial pressure.  4. Left ventricular diastolic parameters are consistent with Grade II diastolic dysfunction (pseudonormalization).  5. Moderately dilated left ventricular internal cavity size.  6. The left ventricle demonstrates global hypokinesis.  7. Global right ventricle has moderately reduced systolic function.The right ventricular size is mildly enlarged. No increase in right ventricular wall thickness.  8. Left atrial size was severely dilated.  9. Right atrial size was normal.  10. The mitral valve is abnormal. Mild mitral valve regurgitation.   12. The tricuspid valve is grossly normal. Tricuspid valve regurgitation is mild.  13. The aortic valve is abnormal. Aortic valve regurgitation is not visualized.  14. AV is thickence, calcified  with restricted motion Peak and mean gradients through the valve are 24 and 15 mm Hg respectively LVOT/AV VTI 0.35 consistent with moderate AS.  15. The pulmonic valve was not well visualized. Pulmonic valve regurgitation is not visualized.  16. The inferior vena cava is dilated in size with >50% respiratory variability, suggesting right atrial pressure of 8 mmHg.  17. The interatrial septum was not well visualized.   Antibiotics   Anti-infectives (From admission, onward)   Start     Dose/Rate Route Frequency Ordered Stop   09/16/19 2200  azithromycin (ZITHROMAX) 500 mg in sodium chloride 0.9 % 250 mL IVPB  Status:  Discontinued    Note to Pharmacy: Last dose at [facility] 1/30 ~0100   500 mg 250 mL/hr over 60 Minutes Intravenous Every 24 hours 09/16/19 0458 09/17/19 1632   09/16/19 0500  cefTRIAXone (ROCEPHIN) 1 g in sodium chloride 0.9 % 100 mL IVPB  Status:  Discontinued    Note to Pharmacy: Due for dose now, last dose at [facility] 1/29 ~0130   1 g 200 mL/hr over 30 Minutes Intravenous Every 24 hours 09/16/19 0458 09/17/19 1632      Subjective:   Tanner Pearson seen and examined today.  Feels breathing has improved, but still on oxygen. Able to lie flat. Denies current chest pain, abdominal pain, nausea or vomiting, diarrhea constipation, dizziness or headache.  Objective:   Vitals:   09/20/19 0113 09/20/19 0523 09/20/19 1010 09/20/19 1226  BP:  (!) 100/58 108/64 114/66  Pulse:  88 77 87  Resp:  18  16  Temp:  97.6 F (36.4 C)  (!) 97.5 F (36.4 C)  TempSrc:  Oral  Oral  SpO2:  99%  99%  Weight: 61.5 kg     Height:        Intake/Output Summary (Last 24 hours) at 09/20/2019 1323 Last data filed at 09/20/2019 1041 Gross per 24 hour  Intake 1823 ml  Output 1825 ml  Net -2 ml   Filed Weights   09/18/19 1037 09/19/19 0557 09/20/19 0113  Weight: 63.5 kg 63.7 kg 61.5 kg    Exam  General: Well developed, well nourished, NAD, appears stated age  86: NCAT, mucous membranes  moist.   Cardiovascular: S1 S2 auscultated, SEM, RRR  Respiratory: Clear to auscultation bilaterally   Abdomen: Soft, nontender, nondistended, + bowel sounds  Extremities: warm dry without cyanosis clubbing or edema  Neuro: AAOx3, nonfocal  Psych: Pleasant, appropriate mood and affect   Data Reviewed: I have personally reviewed following labs and imaging studies  CBC: Recent Labs  Lab 09/16/19 0517 09/17/19 0538 09/18/19 0252 09/19/19 0415 09/20/19 0508  WBC 12.0* 11.8* 11.2* 10.1 9.9  NEUTROABS  --   --  8.4* 7.8* 7.5  HGB 9.5* 9.2* 9.2* 10.0* 9.4*  HCT 29.4* 27.8* 29.0* 30.9* 28.0*  MCV 98.3 97.5 101.8* 100.0 98.6  PLT 114* 112* 117* 119* 0000000*   Basic Metabolic Panel: Recent Labs  Lab 09/16/19 1352 09/17/19 0538 09/18/19 0252 09/19/19 0415 09/20/19 0508  NA 139 140 141 140 142  K 4.8 4.3 4.3 4.9 4.1  CL 106 106 107 103 102  CO2 15* 17* 16* 19* 21*  GLUCOSE 119* 129* 133* 128* 133*  BUN 111* 121* 136* 138* 140*  CREATININE 5.94* 5.80* 5.67* 5.69* 5.41*  CALCIUM 8.1* 8.0* 8.3* 8.5* 8.5*  MG  --   --  2.0 2.2 2.0  PHOS 8.4* 7.8* 7.5* 7.7* 8.0*   GFR: Estimated Creatinine Clearance: 8.2 mL/min (A) (by C-G formula based on SCr of 5.41 mg/dL (H)). Liver Function Tests: Recent Labs  Lab 09/16/19 0517 09/16/19 0517 09/16/19 1352 09/17/19 0538 09/18/19 0252 09/19/19 0415 09/20/19 0508  AST 56*  --   --   --  21 27 18   ALT 36  --   --   --  21 24 21   ALKPHOS 44  --   --   --  52 50 50  BILITOT 0.7  --   --   --  0.3 0.5 0.8  PROT 6.2*  --   --   --  6.1* 6.2* 6.4*  ALBUMIN 3.3*   < > 3.3* 3.2* 3.3* 3.4* 3.5   < > = values in this interval not displayed.   No results for input(s): LIPASE, AMYLASE in the last 168 hours. No results for input(s): AMMONIA in the last 168 hours. Coagulation Profile: No results for input(s): INR, PROTIME in the last 168 hours. Cardiac Enzymes: No results for input(s): CKTOTAL, CKMB, CKMBINDEX, TROPONINI in the last 168  hours. BNP (last 3 results) No results for input(s): PROBNP in the last 8760 hours. HbA1C: No results for input(s): HGBA1C in the last 72 hours. CBG: No results for input(s): GLUCAP in the last 168 hours. Lipid Profile: No results for input(s): CHOL, HDL, LDLCALC, TRIG, CHOLHDL, LDLDIRECT in the last 72 hours. Thyroid Function Tests: No results for input(s): TSH, T4TOTAL, FREET4, T3FREE, THYROIDAB in the last 72 hours. Anemia Panel: No results for input(s): VITAMINB12, FOLATE, FERRITIN, TIBC, IRON, RETICCTPCT in the last 72 hours. Urine analysis:    Component Value Date/Time   COLORURINE STRAW (A) 09/16/2019 1322   APPEARANCEUR HAZY (A) 09/16/2019 1322   LABSPEC 1.009 09/16/2019 1322   PHURINE 5.0 09/16/2019 1322   GLUCOSEU NEGATIVE 09/16/2019 1322   HGBUR SMALL (A) 09/16/2019 1322   BILIRUBINUR NEGATIVE 09/16/2019 1322   KETONESUR NEGATIVE 09/16/2019 1322   PROTEINUR 30 (A) 09/16/2019 1322  NITRITE NEGATIVE 09/16/2019 1322   LEUKOCYTESUR NEGATIVE 09/16/2019 1322   Sepsis Labs: @LABRCNTIP (procalcitonin:4,lacticidven:4)  ) Recent Results (from the past 240 hour(s))  SARS CORONAVIRUS 2 (TAT 6-24 HRS) Nasopharyngeal Nasopharyngeal Swab     Status: None   Collection Time: 09/16/19  4:13 AM   Specimen: Nasopharyngeal Swab  Result Value Ref Range Status   SARS Coronavirus 2 NEGATIVE NEGATIVE Final    Comment: (NOTE) SARS-CoV-2 target nucleic acids are NOT DETECTED. The SARS-CoV-2 RNA is generally detectable in upper and lower respiratory specimens during the acute phase of infection. Negative results do not preclude SARS-CoV-2 infection, do not rule out co-infections with other pathogens, and should not be used as the sole basis for treatment or other patient management decisions. Negative results must be combined with clinical observations, patient history, and epidemiological information. The expected result is Negative. Fact Sheet for Patients:  SugarRoll.be Fact Sheet for Healthcare Providers: https://www.woods-mathews.com/ This test is not yet approved or cleared by the [place] FDA and  has been authorized for detection and/or diagnosis of SARS-CoV-2 by FDA under an Emergency Use Authorization (EUA). This EUA will remain  in effect (meaning this test can be used) for the duration of the COVID-19 declaration under Section 56 4(b)(1) of the Act, 21 U.S.C. section 360bbb-3(b)(1), unless the authorization is terminated or revoked sooner. Performed at [facility], [place] [address], [place], [place] [address]   Culture, blood (routine x 2)     Status: None (Preliminary result)   Collection Time: 09/16/19  5:20 AM   Specimen: BLOOD  Result Value Ref Range Status   Specimen Description BLOOD LEFT HAND  Final   Special Requests   Final    BOTTLES DRAWN AEROBIC ONLY Blood Culture results may not be optimal due to an inadequate volume of blood received in culture bottles   Culture   Final    NO GROWTH 4 DAYS Performed at [facility], [place] [address] [place], [place] [address]    Report Status PENDING  Incomplete  Culture, blood (routine x 2)     Status: None (Preliminary result)   Collection Time: 09/16/19  5:26 AM   Specimen: BLOOD  Result Value Ref Range Status   Specimen Description BLOOD LEFT HAND  Final   Special Requests   Final    BOTTLES DRAWN AEROBIC ONLY Blood Culture adequate volume   Culture   Final    NO GROWTH 4 DAYS Performed at [facility], [place] [address] [place],  [address]    Report Status PENDING  Incomplete      Radiology Studies: DG CHEST PORT 1 VIEW  Result Date: 09/20/2019 CLINICAL DATA:  Shortness of breath.  Heart failure.  Hypertension. EXAM: PORTABLE CHEST 1 VIEW COMPARISON:  1 day prior FINDINGS: Midline trachea. Mild cardiomegaly. Atherosclerosis in the transverse aorta. Probable layering small bilateral pleural effusions,  similar. No pneumothorax. No congestive failure. Right greater than left base subsegmental atelectasis. Similar on the right and slightly improved on the left. IMPRESSION: Minimally improved left base aeration. Otherwise, similar layering bilateral pleural effusions and bibasilar atelectasis. Aortic Atherosclerosis (ICD10-I70.0). Electronically Signed   By: Abigail Miyamoto M.D.   On: 09/20/2019 08:50   DG CHEST PORT 1 VIEW  Result Date: 09/19/2019 CLINICAL DATA:  Shortness of breath EXAM: PORTABLE CHEST 1 VIEW COMPARISON:  Yesterday FINDINGS: Mild cardiomegaly is stable. Stable haziness of the bilateral chest from layering pleural fluid and atelectasis. Interstitial edema is improved. IMPRESSION: Improved  interstitial edema with persistent haziness from layering pleural fluid and atelectasis by recent CT. Electronically Signed   By: Monte Fantasia M.D.   On: 09/19/2019 06:29     Scheduled Meds: . aspirin EC  81 mg Oral Daily  . atorvastatin  80 mg Oral q1800  . B-complex with vitamin C  1 tablet Oral Daily  . clopidogrel  75 mg Oral Daily  . feeding supplement (NEPRO CARB STEADY)  237 mL Oral Q24H  . guaiFENesin  1,200 mg Oral BID  . heparin injection (subcutaneous)  5,000 Units Subcutaneous Q8H  . isosorbide-hydrALAZINE  1 tablet Oral TID  . torsemide  100 mg Oral BID   Continuous Infusions:   LOS: 4 days   Time Spent in minutes   45 minutes  Keirston Saephanh D.O. on 09/20/2019 at 1:23 PM  Between 7am to 7pm - Please see pager noted on [email or web address]  After 7pm go to [email or web address]  And look for the night coverage person covering for me after hours  [facility] Office  [phone]

## 2019-09-20 NOTE — Progress Notes (Signed)
Progress Note  Patient Name: Tanner Pearson Date of Encounter: 09/20/2019  Primary Cardiologist: Jenne Campus, MD   Subjective   Substantially improved, physically and mood-wise. Excellent UO. Able to lie flat in bed. Renal function stable (creat nominally better) despite good diuresis. Weight down about 8 lb from max wt this admission and at 135.6 lb is lower than his previous estimated "dry weight" of 139-140 lb.  Inpatient Medications    Scheduled Meds: . aspirin EC  81 mg Oral Daily  . atorvastatin  80 mg Oral q1800  . B-complex with vitamin C  1 tablet Oral Daily  . clopidogrel  75 mg Oral Daily  . feeding supplement (NEPRO CARB STEADY)  237 mL Oral Q24H  . guaiFENesin  1,200 mg Oral BID  . heparin injection (subcutaneous)  5,000 Units Subcutaneous Q8H  . isosorbide-hydrALAZINE  0.5 tablet Oral TID   Continuous Infusions: . furosemide 100 mg (09/20/19 0616)   PRN Meds: acetaminophen **OR** acetaminophen   Vital Signs    Vitals:   09/19/19 1737 09/19/19 2052 09/20/19 0113 09/20/19 0523  BP: 120/77 117/73  (!) 100/58  Pulse: 86 95  88  Resp: 18 16  18   Temp:  97.6 F (36.4 C)  97.6 F (36.4 C)  TempSrc:  Oral  Oral  SpO2: 100% 100%  99%  Weight:   61.5 kg   Height:        Intake/Output Summary (Last 24 hours) at 09/20/2019 0922 Last data filed at 09/20/2019 0616 Gross per 24 hour  Intake 1938 ml  Output 1800 ml  Net 138 ml   Last 3 Weights 09/20/2019 09/19/2019 09/18/2019  Weight (lbs) 135 lb 9.6 oz 140 lb 6.9 oz 139 lb 15.9 oz  Weight (kg) 61.508 kg 63.7 kg 63.5 kg      Telemetry    NSR - Personally Reviewed  ECG    No new tracing - Personally Reviewed  Physical Exam  Lying almost flat GEN: No acute distress.   Neck: 7-8 cm JVD at 30 deg HOB Cardiac: RRR, no murmurs, rubs, or gallops.  Respiratory: Clear to auscultation bilaterally. GI: Soft, nontender, non-distended  MS: No edema; No deformity. Neuro:  Nonfocal  Psych: Normal affect    Labs    High Sensitivity Troponin:   Recent Labs  Lab 09/16/19 0517 09/16/19 0956  TROPONINIHS 12,172* 11,574*      Chemistry Recent Labs  Lab 09/18/19 0252 09/19/19 0415 09/20/19 0508  NA 141 140 142  K 4.3 4.9 4.1  CL 107 103 102  CO2 16* 19* 21*  GLUCOSE 133* 128* 133*  BUN 136* 138* 140*  CREATININE 5.67* 5.69* 5.41*  CALCIUM 8.3* 8.5* 8.5*  PROT 6.1* 6.2* 6.4*  ALBUMIN 3.3* 3.4* 3.5  AST 21 27 18   ALT 21 24 21   ALKPHOS 52 50 50  BILITOT 0.3 0.5 0.8  GFRNONAA 8* 8* 9*  GFRAA 10* 9* 10*  ANIONGAP 18* 18* 19*     Hematology Recent Labs  Lab 09/18/19 0252 09/19/19 0415 09/20/19 0508  WBC 11.2* 10.1 9.9  RBC 2.85* 3.09* 2.84*  HGB 9.2* 10.0* 9.4*  HCT 29.0* 30.9* 28.0*  MCV 101.8* 100.0 98.6  MCH 32.3 32.4 33.1  MCHC 31.7 32.4 33.6  RDW 14.9 14.8 14.6  PLT 117* 119* 125*    BNPNo results for input(s): BNP, PROBNP in the last 168 hours.   DDimer No results for input(s): DDIMER in the last 168 hours.   Radiology  DG CHEST PORT 1 VIEW  Result Date: 09/20/2019 CLINICAL DATA:  Shortness of breath.  Heart failure.  Hypertension. EXAM: PORTABLE CHEST 1 VIEW COMPARISON:  1 day prior FINDINGS: Midline trachea. Mild cardiomegaly. Atherosclerosis in the transverse aorta. Probable layering small bilateral pleural effusions, similar. No pneumothorax. No congestive failure. Right greater than left base subsegmental atelectasis. Similar on the right and slightly improved on the left. IMPRESSION: Minimally improved left base aeration. Otherwise, similar layering bilateral pleural effusions and bibasilar atelectasis. Aortic Atherosclerosis (ICD10-I70.0). Electronically Signed   By: Abigail Miyamoto M.D.   On: 09/20/2019 08:50   DG CHEST PORT 1 VIEW  Result Date: 09/19/2019 CLINICAL DATA:  Shortness of breath EXAM: PORTABLE CHEST 1 VIEW COMPARISON:  Yesterday FINDINGS: Mild cardiomegaly is stable. Stable haziness of the bilateral chest from layering pleural fluid and  atelectasis. Interstitial edema is improved. IMPRESSION: Improved interstitial edema with persistent haziness from layering pleural fluid and atelectasis by recent CT. Electronically Signed   By: Monte Fantasia M.D.   On: 09/19/2019 06:29    Cardiac Studies  Echo this admission 1. Left ventricular ejection fraction, by visual estimation, is <20%. The left ventricle has severely decreased function. There is no left ventricular hypertrophy.  2. Definity contrast agent was given IV to delineate the left ventricular endocardial borders.  3. Elevated left atrial pressure.  4. Left ventricular diastolic parameters are consistent with Grade II diastolic dysfunction (pseudonormalization).  5. Moderately dilated left ventricular internal cavity size.  6. The left ventricle demonstrates global hypokinesis.  7. Global right ventricle has moderately reduced systolic function.The right ventricular size is mildly enlarged. No increase in right ventricular wall thickness.  8. Left atrial size was severely dilated.  9. Right atrial size was normal.  10. The mitral valve is abnormal. Mild mitral valve regurgitation.   12. The tricuspid valve is grossly normal. Tricuspid valve regurgitation is mild.  13. The aortic valve is abnormal. Aortic valve regurgitation is not visualized.  14. AV is thickence, calcified with restricted motion Peak and mean gradients through the valve are 24 and 15 mm Hg respectively LVOT/AV VTI 0.35 consistent with moderate AS.  15. The pulmonic valve was not well visualized. Pulmonic valve regurgitation is not visualized.  16. The inferior vena cava is dilated in size with >50% respiratory variability, suggesting right atrial pressure of 8 mmHg.  17. The interatrial septum was not well visualized.   Patient Profile     86 y.o. male withCAD(NSTEMI 07/2015 managed medically), ischemic cardiomyopathy/chronic combined CHF, hypertension, hyperlipidemia,chronic appearing  anemia,and stage VCKDwho was admitted to [facility] with increasing SOB, found to have elevated BNP, d-dimer, Cr of 5.8, and elevated troponin. 2D Echo1/31/2021 which revealed EF is now less than 20%, grade 2 DD, mild MR, severe LAE, moderate aortic stenosis(EF previously 35-40%).  Assessment & Plan    1. Acute on chronic combined CHF with worsening cardiomyopathy: may be time to switch to PO diuretics (will seek advice from Nephrology re: torsemide vs furosemide, considering very low GFR). Increase to full dose Bidil; will add back carvedilol tomorrow if BP allows.   2. AKI superimposed on CKD stage IV-V - baseline Cr reported 3.8-4, peak 5.94, slowly improving - 5.4 today.  Pt was not sure about HD and wanted time to think about it. Daughter is a Merchandiser, retail. In my opinion, he will do poorly with HD and a palliative care approach appears appropriate.  3. Reported history of CAD with NSTEMI (here with new  LBBB as well): not a candidate for invasive evaluation or PCI. Continue ASA, clopidogrel, statin. He does not have angina.  4. Essential HTN: increase dose of Bidil.  5. Hyperlipidemia: switched to atorvastatin.  6. Chronic anemia: normo/macrocytic indices, likely due to renal failure.  7. Moderate AS: gradients underestimate severity due to low EF, but non-severe AS; dimensionless index 0.35, squarely in moderate range, not likely to be a cause of the worsening CMP.     For questions or updates, please contact [place] Please consult [email or web address] for contact info under        Signed, Sanda Klein, MD  09/20/2019, 9:22 AM

## 2019-09-20 NOTE — Progress Notes (Signed)
Daily Progress Note   Patient Name: Tanner Pearson       Date: 09/20/2019 DOB: February 26, 1929  Age: 86 y.o. MRN#: [id number] Attending Physician: Cristal Ford, DO Primary Care Physician: Ernestene Kiel, MD Admit Date: 09/16/2019  Reason for Consultation/Follow-up: Establishing goals of care  Subjective: Patient awake, alert, oriented and able to participate in discussion. Denies pain or shortness of breath.  GOC:  F/u GOC with patient and son Merry Proud) at bedside.   Reviewed course of hospitalization including diagnoses, interventions, and plan of care including ongoing medical management. Again discussed poor candidacy for dialysis with worsening heart failure. Patient/son understand this.   Patient is ready to complete MOST form. He was able to discuss with son and daughter yesterday. Patient's decisions include: DNR/DNI, limited additional interventions including re-hospitalization for medical management/BiPAP/CPAP if indicated, IVF/ABX for time trial if indicated, and NO feeding tube. Durable DNR completed. Copies of MOST and durable DNR made for chart and patient/children.   Discussed ongoing medical management/optimization inpatient with plan to discharge home. Patient/family agreeable with outpatient palliative referral. Patient wishes to return home on discharge. Children plan to check in more frequently.   Therapeutic listening as patient/son share stories. PMT contact information given.    Length of Stay: 4  Current Medications: Scheduled Meds:  . aspirin EC  81 mg Oral Daily  . atorvastatin  80 mg Oral q1800  . B-complex with vitamin C  1 tablet Oral Daily  . clopidogrel  75 mg Oral Daily  . feeding supplement (NEPRO CARB STEADY)  237 mL Oral Q24H  . guaiFENesin  1,200 mg  Oral BID  . heparin injection (subcutaneous)  5,000 Units Subcutaneous Q8H  . isosorbide-hydrALAZINE  1 tablet Oral TID  . torsemide  100 mg Oral BID    Continuous Infusions:   PRN Meds: acetaminophen **OR** acetaminophen  Physical Exam Vitals and nursing note reviewed.  Constitutional:      General: He is awake.  HENT:     Head: Normocephalic and atraumatic.  Cardiovascular:     Heart sounds: Murmur present.  Pulmonary:     Effort: No tachypnea, accessory muscle usage or respiratory distress.     Comments: 2L Skin:    General: Skin is warm and dry.  Neurological:     Mental Status: He is alert and oriented  to person, place, and time.  Psychiatric:        Attention and Perception: Attention normal.        Mood and Affect: Mood normal.        Speech: Speech normal.        Behavior: Behavior normal.        Cognition and Memory: Cognition normal.            Vital Signs: BP 108/64 (BP Location: Right Arm)   Pulse 77   Temp 97.6 F (36.4 C) (Oral)   Resp 18   Ht 5\' 10"  (1.778 m)   Wt 61.5 kg   SpO2 99%   BMI 19.46 kg/m  SpO2: SpO2: 99 % O2 Device: O2 Device: Nasal Cannula O2 Flow Rate: O2 Flow Rate (L/min): 2 L/min  Intake/output summary:   Intake/Output Summary (Last 24 hours) at 09/20/2019 1030 Last data filed at 09/20/2019 [id number] Gross per 24 hour  Intake 1818 ml  Output 1400 ml  Net 418 ml   LBM: Last BM Date: 09/18/19 Baseline Weight: Weight: 64.8 kg Most recent weight: Weight: 61.5 kg       Palliative Assessment/Data: PPS 50%    Flowsheet Rows     Most Recent Value  Intake Tab  Referral Department  Hospitalist  Unit at Time of Referral  Cardiac/Telemetry Unit  Palliative Care Primary Diagnosis  Other (Comment) [CHF/CKD]  Palliative Care Type  New Palliative care  Reason for referral  Clarify Goals of Care  Date first seen by Palliative Care  09/19/19  Clinical Assessment  Palliative Performance Scale Score  50%  Psychosocial & Spiritual  Assessment  Palliative Care Outcomes  Patient/Family meeting held?  Yes  Who was at the meeting?  patient, spoke with daughter via telephone  Palliative Care Outcomes  Clarified goals of care, Provided end of life care assistance, Provided psychosocial or spiritual support, ACP counseling assistance, Linked to palliative care logitudinal support      Patient Active Problem List   Diagnosis Date Noted  . Palliative care by specialist   . Goals of care, counseling/discussion   . DNR (do not resuscitate) discussion   . Pulmonary edema 09/16/2019  . CHF exacerbation ([place]) 09/16/2019  . Acute renal failure superimposed on stage 4 chronic kidney disease ([place]) 09/16/2019  . Coronary artery disease involving native coronary artery of native heart without angina pectoris   . NSTEMI (non-ST elevated myocardial infarction) ([place])   . Essential hypertension 08/12/2015  . Hyperlipidemia 08/12/2015  . Chronic kidney disease (CKD), stage IV (severe) ([place]) 08/12/2015  . Chronic combined systolic and diastolic congestive heart failure ([place]) 08/12/2015  . Dyspnea 08/12/2015  . Acute on chronic combined systolic (congestive) and diastolic (congestive) heart failure ([place]) 08/12/2015  . Shortness of breath   . Cardiomyopathy ([place]) 04/02/2015  . Chronic cholecystitis 04/02/2015    Palliative Care Assessment & Plan   Patient Profile: 86 y.o. male  with past medical history of CKD stage IV followed by Dr. [name] with right forearm AV fistula (never started dialysis), CHF with EF 35-40% from ECHO in 2016, HTN, HLD, PAD, anemia, GERD, gout admitted on 09/16/2019 with shortness of breath from [facility] ED. Reports orthopnea. Hospital admission for acute hypoxemic respiratory failure secondary to pulmonary edema in the setting of acute on chronic combined CHF EF <20%, worsening CKD, aortic stenosis. Aggressively diuresing. Nephrology and cardiology following. Patient is a poor candidate for hemodialysis. Palliative  medicine consultation for goals of care.   Assessment: Acute  hypoxemic respiratory failure Pulmonary edema Acute on chronic combined systolic and diastolic CHF AKI on CKD IV Aortic stenosis Demand ischemia CAD Normocytic anemia Thrombocytopenia  Recommendations/Plan:  MOST form completed with patient and son. Patient's decisions include: DNR/DNI, limited additional interventions including re-hospitalization if indicated, CPAP/BiPAP if indicated, IVF/ABX for time trial if indicated, and NO feeding tube. Durable DNR completed. Copies of MOST and durable placed in chart and given to patient/son.   Requested copy of patient's advance directive.   Continue medical management. Medically optimize with plan to discharge home with outpatient palliative referral. TOC notified. This can transition to home hospice services when patient/children ready.   Patient declines dialysis, understanding poor candidacy for dialysis and that this will likely not impact quality of life.    Code Status: DNR/DNI   Code Status Orders  (From admission, onward)         Start     Ordered   09/16/19 0455  Full code  Continuous     09/16/19 0458        Code Status History    Date Active Date Inactive Code Status Order ID Comments User Context   08/12/2015 0828 08/15/2015 1917 Partial Code [id number]  Willia Craze, NP Inpatient   Advance Care Planning Activity       Prognosis:  Poor long-term prognosis  Discharge Planning:  To Be Determined  Care plan was discussed with patient, daughter, son, RN, Dr. Ree Kida  Thank you for allowing the Palliative Medicine Team to assist in the care of this patient.   Time In: 1025- 1530 Time Out: 1035 1620 Total Time 60 Prolonged Time Billed no      Greater than 50%  of this time was spent counseling and coordinating care related to the above assessment and plan.  Ihor Dow, DNP, FNP-C Palliative Medicine Team  Phone: [phone] Fax:  [phone]  Please contact Palliative Medicine Team phone at [phone] for questions and concerns.

## 2019-09-20 NOTE — Progress Notes (Addendum)
Nutrition Follow-up  DOCUMENTATION CODES:   Severe malnutrition in context of chronic illness  INTERVENTION:   -Continue b-complex with vitamin C -Increase Nepro Shake po to BID, each supplement provides 425 kcal and 19 grams protein -Magic cup TID with meals, each supplement provides 290 kcal and 9 grams of protein  NUTRITION DIAGNOSIS:   Severe Malnutrition related to chronic illness(CKD, CHF) as evidenced by energy intake < 75% for > or equal to 1 month, moderate fat depletion, severe fat depletion, moderate muscle depletion, severe muscle depletion.  Ongoing  GOAL:   Patient will meet greater than or equal to 90% of their needs  Progressing   MONITOR:   PO intake, Supplement acceptance, Labs, Weight trends, Skin, I & O's  REASON FOR ASSESSMENT:   Malnutrition Screening Tool    ASSESSMENT:   86 year old male with past medical history of CKD IV (R AV fistula in place), HTN, HLD, CHF (EF 35-40%), PAD, anemia, GERD, and gout who presented to [facility] ED on 1/29 for evaluation of 3 day history of shortness of breath and reports seeing his daughter last week who recently tested positive for COVID-19. CXR showed bilateral patchy opacities concerning for pneumonia; CT chest showed mild cardiomegaly with moderate bilateral pleural effusions. CoV-2 PCR test was negative and patient admitted for pulmonary edema.  Reviewed I/O's: +138 ml x 24 hours and -3 L x 24 hours  UOP: 1.8 L x 24 hours  Spoke with pt at bedside, who was pleasant and in good spirits today. He reports feeling a little bit better, however, did not sleep well last night. He ate very little breakfast this morning, secondary to poor appetite and only receiving a spoon to eat eggs and pancakes.   Pt reports decreased appetite over the past 3 weeks, due to difficulty breathing and a lack of desire to eat. Prior to this pt reports consuming 2 meals per day (Brunch: sausage biscuit and coffee; Dinner: meat, starch and  vegetable). Pt shares he has a very good quality of life; he lived alone and was still able to drive in town to his favorite restaurants. He has 3 children who live closeby and visit frequently ("there's always one of them coming in and out").   Noted meal completion 40-50%. Pt shares he is able to receive food items he likes, but has little desire to eat. He enjoys the Nepro supplements he given and is interested in receiving them. Pt also amenable to [employer].   Reviewed wt hx; pt has experienced a 3.3% wt loss over the past 3 months, which is not significant for time frame.   Discussed importance of good meal and supplement intake to promote healing. Pt very appreciative of RD visit; provided pt with emotional support and actively listened to pt stories about his grandchildren who live in [place].   Palliative care following. Pt likely does not desire HD as it would not add to his quality of life. Plan for goals of care meeting to determine goals of care later on today.   Medications reviewed and include demadex.   Labs reviewed.   NUTRITION - FOCUSED PHYSICAL EXAM:    Most Recent Value  Orbital Region  Moderate depletion  Upper Arm Region  Severe depletion  Thoracic and Lumbar Region  Mild depletion  Buccal Region  Moderate depletion  Temple Region  Moderate depletion  Clavicle Bone Region  Moderate depletion  Clavicle and Acromion Bone Region  Moderate depletion  Scapular Bone Region  Moderate depletion  Dorsal Hand  Severe depletion  Patellar Region  Severe depletion  Anterior Thigh Region  Severe depletion  Posterior Calf Region  Severe depletion  Edema (RD Assessment)  Mild  Hair  Reviewed  Eyes  Reviewed  Mouth  Reviewed  Skin  Reviewed  Nails  Reviewed       Diet Order:   Diet Order            Diet renal with fluid restriction Fluid restriction: 1200 mL Fluid; Room service appropriate? Yes; Fluid consistency: Thin  Diet effective now              EDUCATION  NEEDS:   Education needs have been addressed  Skin:  Skin Assessment: Reviewed RN Assessment  Last BM:  09/19/19  Height:   Ht Readings from Last 1 Encounters:  09/16/19 5\' 10"  (1.778 m)    Weight:   Wt Readings from Last 1 Encounters:  09/20/19 61.5 kg    Ideal Body Weight:  75.5 kg  BMI:  Body mass index is 19.46 kg/m.  Estimated Nutritional Needs:   Kcal:  1800-2000  Protein:  90-100  Fluid:  1.2 L/day per MD    Cedar Ditullio A. Jimmye Norman, RD, LDN, [place] Registered Dietitian II Certified Diabetes Care and Education Specialist Pager: [phone] After hours Pager: [phone]

## 2019-09-20 NOTE — Plan of Care (Signed)

## 2019-09-20 NOTE — Progress Notes (Signed)
[facility] Progress Note    Assessment/ Plan:    SOB/ pulm edema/acute CHF exacerbation: much better volume status now.  D/w Dr Tanner Pearson.  Will transition to PO torsemide 100 mg BID.    AKI on CKD IV - baseline creat 3.8- 4. Cr up to 5.77 and BUN 130s but isn't uremic.  Dr Tanner Pearson has had talks with pt about dialysis and I have had talks with him too--> he has declined dialysis and I think this is reasonable as it is unlikely to improve QOL.   HTN - agree w/ holding home coreg and hydralazine, BP's  normal   HL  H/o CAD  H/o combined syst/ diast CHF  Subjective:    Pt sitting up in chair this AM.  States that he does not want to pursue dialysis.   Objective:   BP 108/64 (BP Location: Right Arm)   Pulse 77   Temp 97.6 F (36.4 C) (Oral)   Resp 18   Ht 5\' 10"  (1.778 m)   Wt 61.5 kg   SpO2 99%   BMI 19.46 kg/m   Intake/Output Summary (Last 24 hours) at 09/20/2019 1059 Last data filed at 09/20/2019 1041 Gross per 24 hour  Intake 1823 ml  Output 1825 ml  Net -2 ml   Weight change: -1.992 kg  Physical Exam: Gen: NAD, sitting in bed CVS: RRR Resp: bilateral crackles much improved Abd: soft Ext: no real LE edema  Imaging: DG CHEST PORT 1 VIEW  Result Date: 09/20/2019 CLINICAL DATA:  Shortness of breath.  Heart failure.  Hypertension. EXAM: PORTABLE CHEST 1 VIEW COMPARISON:  1 day prior FINDINGS: Midline trachea. Mild cardiomegaly. Atherosclerosis in the transverse aorta. Probable layering small bilateral pleural effusions, similar. No pneumothorax. No congestive failure. Right greater than left base subsegmental atelectasis. Similar on the right and slightly improved on the left. IMPRESSION: Minimally improved left base aeration. Otherwise, similar layering bilateral pleural effusions and bibasilar atelectasis. Aortic Atherosclerosis (ICD10-I70.0). Electronically Signed   By: Tanner Pearson M.D.   On: 09/20/2019 08:50   DG CHEST PORT 1 VIEW  Result Date:  09/19/2019 CLINICAL DATA:  Shortness of breath EXAM: PORTABLE CHEST 1 VIEW COMPARISON:  Yesterday FINDINGS: Mild cardiomegaly is stable. Stable haziness of the bilateral chest from layering pleural fluid and atelectasis. Interstitial edema is improved. IMPRESSION: Improved interstitial edema with persistent haziness from layering pleural fluid and atelectasis by recent CT. Electronically Signed   By: Monte Fantasia M.D.   On: 09/19/2019 06:29    Labs: BMET Recent Labs  Lab 09/16/19 0517 09/16/19 1352 09/17/19 0538 09/18/19 0252 09/19/19 0415 09/20/19 0508  NA 139 139 140 141 140 142  K 5.0 4.8 4.3 4.3 4.9 4.1  CL 105 106 106 107 103 102  CO2 15* 15* 17* 16* 19* 21*  GLUCOSE 128* 119* 129* 133* 128* 133*  BUN 106* 111* 121* 136* 138* 140*  CREATININE 5.81* 5.94* 5.80* 5.67* 5.69* 5.41*  CALCIUM 8.2* 8.1* 8.0* 8.3* 8.5* 8.5*  PHOS  --  8.4* 7.8* 7.5* 7.7* 8.0*   CBC Recent Labs  Lab 09/17/19 0538 09/18/19 0252 09/19/19 0415 09/20/19 0508  WBC 11.8* 11.2* 10.1 9.9  NEUTROABS  --  8.4* 7.8* 7.5  HGB 9.2* 9.2* 10.0* 9.4*  HCT 27.8* 29.0* 30.9* 28.0*  MCV 97.5 101.8* 100.0 98.6  PLT 112* 117* 119* 125*    Medications:    . aspirin EC  81 mg Oral Daily  . atorvastatin  80 mg Oral q1800  .  B-complex with vitamin C  1 tablet Oral Daily  . clopidogrel  75 mg Oral Daily  . feeding supplement (NEPRO CARB STEADY)  237 mL Oral Q24H  . guaiFENesin  1,200 mg Oral BID  . heparin injection (subcutaneous)  5,000 Units Subcutaneous Q8H  . isosorbide-hydrALAZINE  1 tablet Oral TID  . torsemide  100 mg Oral BID      Madelon Lips, MD 09/20/2019, 10:59 AM

## 2019-09-20 NOTE — Progress Notes (Signed)
Patient's son is at the bedside to have a palliative consult at 1530 with the palliative team.

## 2019-09-21 DIAGNOSIS — E43 Unspecified severe protein-calorie malnutrition: Secondary | ICD-10-CM | POA: Insufficient documentation

## 2019-09-21 LAB — CBC
HCT: 29.1 % — ABNORMAL LOW (ref 39.0–52.0)
Hemoglobin: 9.4 g/dL — ABNORMAL LOW (ref 13.0–17.0)
MCH: 31.9 pg (ref 26.0–34.0)
MCHC: 32.3 g/dL (ref 30.0–36.0)
MCV: 98.6 fL (ref 80.0–100.0)
Platelets: 140 10*3/uL — ABNORMAL LOW (ref 150–400)
RBC: 2.95 MIL/uL — ABNORMAL LOW (ref 4.22–5.81)
RDW: 14.9 % (ref 11.5–15.5)
WBC: 10.7 10*3/uL — ABNORMAL HIGH (ref 4.0–10.5)
nRBC: 0 % (ref 0.0–0.2)

## 2019-09-21 LAB — RENAL FUNCTION PANEL
Albumin: 3.4 g/dL — ABNORMAL LOW (ref 3.5–5.0)
Anion gap: 19 — ABNORMAL HIGH (ref 5–15)
BUN: 140 mg/dL — ABNORMAL HIGH (ref 8–23)
CO2: 22 mmol/L (ref 22–32)
Calcium: 8.6 mg/dL — ABNORMAL LOW (ref 8.9–10.3)
Chloride: 101 mmol/L (ref 98–111)
Creatinine, Ser: 5.6 mg/dL — ABNORMAL HIGH (ref 0.61–1.24)
GFR calc Af Amer: 10 mL/min — ABNORMAL LOW (ref >60)
GFR calc non Af Amer: 8 mL/min — ABNORMAL LOW (ref >60)
Glucose, Bld: 132 mg/dL — ABNORMAL HIGH (ref 70–99)
Phosphorus: 8.6 mg/dL — ABNORMAL HIGH (ref 2.5–4.6)
Potassium: 4.3 mmol/L (ref 3.5–5.1)
Sodium: 142 mmol/L (ref 135–145)

## 2019-09-21 LAB — CULTURE, BLOOD (ROUTINE X 2)
Culture: NO GROWTH
Culture: NO GROWTH
Special Requests: ADEQUATE

## 2019-09-21 MED ORDER — TORSEMIDE 100 MG PO TABS
100.0000 mg | ORAL_TABLET | Freq: Every day | ORAL | Status: DC
  Administered 2019-09-21 – 2019-09-22 (×2): 100 mg via ORAL
  Filled 2019-09-21 (×2): qty 1

## 2019-09-21 MED ORDER — ALUM & MAG HYDROXIDE-SIMETH 200-200-20 MG/5ML PO SUSP
30.0000 mL | Freq: Four times a day (QID) | ORAL | Status: DC | PRN
  Administered 2019-09-21: 21:00:00 30 mL via ORAL
  Filled 2019-09-21: qty 30

## 2019-09-21 MED ORDER — ALPRAZOLAM 0.25 MG PO TABS: 0.2500 mg | ORAL_TABLET | Freq: Every day | ORAL | Status: DC | PRN

## 2019-09-21 NOTE — Progress Notes (Addendum)
PROGRESS NOTE    Tanner Pearson  [id number] DOB: [birth year] DOA: 09/16/2019 PCP: Ernestene Kiel, MD   Brief Narrative:  HPI On 09/16/2019 by Dr. Shela Leff Tanner Pearson a [id number] y.o.malewith medical history significant ofCKD stageIV followed by Dr. [name] and has a right forearm AV fistula in place but not started dialysis yet, hypertension, hyperlipidemia, CHF with echo done in April 2016 EF 35 to 40%, PAD, anemia, GERD, gout presented to [facility] ED on 1/29 for evaluation of shortness of breath.Patient reports 3-day history of shortness of breath and cough. Also reports orthopnea. No lower extremity edema. Denies fevers, chills, fatigue, body aches, vomiting, abdominal pain, or diarrhea. States he saw his daughter a week ago and she recently tested positive for COVID-19.  Interim history Patient admitted with acute hypoxic respiratory failure with pulmonary edema in the setting of CHF exacerbation with worsening CKD.  Patient found to EF less than 20% and worsening diastolic dysfunction.  Cardiology consulted and appreciated as well as nephrology.  Palliative care also consulted for goals of care discussion. Patient now DNR. Cardiology has signed off. Now pending PT/OT.  Assessment & Plan   Acute hypoxemic respiratory failure with pulmonary edema  -in setting of CHF exacerbation with worsening CKD -Covid was negative x2 -Initially thought patient had pneumonia and was placed on azithromycin and ceftriaxone however these were discontinued given that chest x-ray is more consistent with volume overload and patient is afebrile with minimal leukocytosis -Was placed on IV Lasix per nephrology -Continue supplemental oxygen, incentive spirometry -Would like to ambulate patient and monitor O2 saturations  Acute on chronic combined systolic and diastolic heart failure -Suspect contributing to patient's shortness of breath and pulmonary edema -EF is down to 20% with worsening  grade 2 diastolic dysfunction as well as aortic stenosis -Cardiology consulted and appreciated, patient is not a candidate for ACE inhibitors ARB therapy or spironolactone given renal function -Continue to monitor intake and output, daily weights -Was initially placed on IV Lasix, however has been transitioned to oral torsemide 100 mg twice daily -Cardiology recommended: Torsemide 100 mg daily (stop furosemide); BiDil one PO 3 x daily; ASA 81 mg and clopidogrel 75 mg daily;  atorvastatin 80 mg daily. Resume carvedilol as an outpatient, repeat BMP in one week. Follow up with Dr. Agustin Cree.   Acute kidney injury on chronic kidney disease, stage IV/metabolic acidosis -Baseline creatinine approximately 3.8-4 -Nephrology consulted and appreciated -Renal ultrasound showed findings of chronic medical renal disease.  No evidence of hydronephrosis.  Small bilateral renal cysts.  No renal masses identified.  Diffuse bladder wall thickening, likely due to chronic bladder outlet obstruction given enlarged prostate. -UA obtained showing hazy appearance, negative nitrites, negative leukocytes, rare bacteria, negative ketones -Creatinine peaked 5.94, currently 5.60 -Patient has been transitioned to oral torsemide, 100 mg twice daily -Continue to monitor BMP -Of note, patient has declined dialysis.  Palliative care also consulted.  NSTEMI type II -Likely demand ischemia in the setting of acute illness as well as recent acute CHF exacerbation and acute kidney injury -Patient denies any chest pain or anginal symptoms -Echocardiogram as below -Cardiology consulted and appreciated-also noted to have new LBBB, however patient not a candidate for invasive evaluation or PCI -Troponin peaked at 12,000 172 -Continue statin, aspirin -Patient was on heparin drip however this was discontinued by cardiology.  Patient was placed on Plavix  Aortic stenosis -Caution with excessive diuresis/fluids given moderate to  severe -Noted on echocardiogram -Cardiology following  Hyperlipidemia -Continue statin  Essential hypertension -BP stable, continue BiDil  Coronary artery disease -Currently no complaints of chest pain -Continue aspirin, statin, Plavix, Bidil  Normocytic anemia/anemia of chronic disease -Hemoglobin currently 9.4, continue to monitor CBC -Anemia panel showed iron 40, adequate storage  Leukocytosis -Resolved, continue to monitor CBC  Thrombocytopenia  -Platelets improving, currently 140 -Continue to monitor CBC  Goals of care -Given patient's comorbidities with a creatinine >5, NSTEMI, etc, he is a poor candidate for invasive evaluation nor PCI or dialysis at this time. -Palliative care consulted and appreciated, patient now DNR. MOST form completed. Patient agreeable to outpatient palliative referral and wishes to return home.  Deconditioning -PT and OT consulted  Severe malnutrition  -in the context of chronic illness (CKD, CHF) -Nutrition consulted, recommended supplements  DVT Prophylaxis  Heparin  Code Status: DNR  Family Communication: None at bedside  Disposition Plan: Admitted.  Suspect patient came from home although was transferred from [facility] ED.  CHF and renal function appear to be improving and stable. Pending PT and OT evaluations. Suspect dispo to Home on 2/5    Consultants Cardiology Nephrology Palliative Care  Procedures  Echocardiogram: 1. Left ventricular ejection fraction, by visual estimation, is <20%. The left ventricle has severely decreased function. There is no left ventricular hypertrophy.  2. Definity contrast agent was given IV to delineate the left ventricular endocardial borders.  3. Elevated left atrial pressure.  4. Left ventricular diastolic parameters are consistent with Grade II diastolic dysfunction (pseudonormalization).  5. Moderately dilated left ventricular internal cavity size.  6. The left ventricle demonstrates  global hypokinesis.  7. Global right ventricle has moderately reduced systolic function.The right ventricular size is mildly enlarged. No increase in right ventricular wall thickness.  8. Left atrial size was severely dilated.  9. Right atrial size was normal.  10. The mitral valve is abnormal. Mild mitral valve regurgitation.   12. The tricuspid valve is grossly normal. Tricuspid valve regurgitation is mild.  13. The aortic valve is abnormal. Aortic valve regurgitation is not visualized.  14. AV is thickence, calcified with restricted motion Peak and mean gradients through the valve are 24 and 15 mm Hg respectively LVOT/AV VTI 0.35 consistent with moderate AS.  15. The pulmonic valve was not well visualized. Pulmonic valve regurgitation is not visualized.  16. The inferior vena cava is dilated in size with >50% respiratory variability, suggesting right atrial pressure of 8 mmHg.  17. The interatrial septum was not well visualized.   Antibiotics   Anti-infectives (From admission, onward)   Start     Dose/Rate Route Frequency Ordered Stop   09/16/19 2200  azithromycin (ZITHROMAX) 500 mg in sodium chloride 0.9 % 250 mL IVPB  Status:  Discontinued    Note to Pharmacy: Last dose at [facility] 1/30 ~0100   500 mg 250 mL/hr over 60 Minutes Intravenous Every 24 hours 09/16/19 0458 09/17/19 1632   09/16/19 0500  cefTRIAXone (ROCEPHIN) 1 g in sodium chloride 0.9 % 100 mL IVPB  Status:  Discontinued    Note to Pharmacy: Due for dose now, last dose at [facility] 1/29 ~0130   1 g 200 mL/hr over 30 Minutes Intravenous Every 24 hours 09/16/19 0458 09/17/19 1632      Subjective:   Tanner Pearson seen and examined today.  Feels tired this morning. Feels breathing has improved but has not been out of bed. Able to lie flat. Denies current chest pain, abdominal pain, N/V/D/C, dizziness, headache.  Objective:   Vitals:   09/20/19 1226  09/20/19 1653 09/20/19 2017 09/21/19 0418  BP: 114/66 114/72 (!) 107/59  107/60  Pulse: 87 80 86 85  Resp: 16 16 19 19   Temp: (!) 97.5 F (36.4 C) 97.8 F (36.6 C) 97.8 F (36.6 C) 98.1 F (36.7 C)  TempSrc: Oral Oral Oral Oral  SpO2: 99% 100% 100% 100%  Weight:    60.6 kg  Height:        Intake/Output Summary (Last 24 hours) at 09/21/2019 1020 Last data filed at 09/21/2019 0900 Gross per 24 hour  Intake 365 ml  Output 1425 ml  Net -1060 ml   Filed Weights   09/19/19 0557 09/20/19 0113 09/21/19 0418  Weight: 63.7 kg 61.5 kg 60.6 kg   Exam  General: Well developed, well nourished, NAD, appears stated age  72: NCAT, mucous membranes moist.   Cardiovascular: S1 S2 auscultated, SEM, RRR  Respiratory: Clear to auscultation bilaterally   Abdomen: Soft, nontender, nondistended, + bowel sounds  Extremities: warm dry without cyanosis clubbing or edema  Neuro: AAOx3, nonfocal  Psych: Pleasant, appropriate mood and affect   Data Reviewed: I have personally reviewed following labs and imaging studies  CBC: Recent Labs  Lab 09/17/19 0538 09/18/19 0252 09/19/19 0415 09/20/19 0508 09/21/19 0359  WBC 11.8* 11.2* 10.1 9.9 10.7*  NEUTROABS  --  8.4* 7.8* 7.5  --   HGB 9.2* 9.2* 10.0* 9.4* 9.4*  HCT 27.8* 29.0* 30.9* 28.0* 29.1*  MCV 97.5 101.8* 100.0 98.6 98.6  PLT 112* 117* 119* 125* [id number]*   Basic Metabolic Panel: Recent Labs  Lab 09/17/19 0538 09/18/19 0252 09/19/19 0415 09/20/19 0508 09/21/19 0359  NA 140 141 140 142 142  K 4.3 4.3 4.9 4.1 4.3  CL 106 107 103 102 101  CO2 17* 16* 19* 21* 22  GLUCOSE 129* 133* 128* 133* 132*  BUN 121* 136* 138* 140* 140*  CREATININE 5.80* 5.67* 5.69* 5.41* 5.60*  CALCIUM 8.0* 8.3* 8.5* 8.5* 8.6*  MG  --  2.0 2.2 2.0  --   PHOS 7.8* 7.5* 7.7* 8.0* 8.6*   GFR: Estimated Creatinine Clearance: 7.8 mL/min (A) (by C-G formula based on SCr of 5.6 mg/dL (H)). Liver Function Tests: Recent Labs  Lab 09/16/19 0517 09/16/19 1352 09/17/19 0538 09/18/19 0252 09/19/19 0415 09/20/19 0508  09/21/19 0359  AST 56*  --   --  21 27 18   --   ALT 36  --   --  21 24 21   --   ALKPHOS 44  --   --  52 50 50  --   BILITOT 0.7  --   --  0.3 0.5 0.8  --   PROT 6.2*  --   --  6.1* 6.2* 6.4*  --   ALBUMIN 3.3*   < > 3.2* 3.3* 3.4* 3.5 3.4*   < > = values in this interval not displayed.   No results for input(s): LIPASE, AMYLASE in the last 168 hours. No results for input(s): AMMONIA in the last 168 hours. Coagulation Profile: No results for input(s): INR, PROTIME in the last 168 hours. Cardiac Enzymes: No results for input(s): CKTOTAL, CKMB, CKMBINDEX, TROPONINI in the last 168 hours. BNP (last 3 results) No results for input(s): PROBNP in the last 8760 hours. HbA1C: No results for input(s): HGBA1C in the last 72 hours. CBG: No results for input(s): GLUCAP in the last 168 hours. Lipid Profile: No results for input(s): CHOL, HDL, LDLCALC, TRIG, CHOLHDL, LDLDIRECT in the last 72 hours. Thyroid Function Tests: No  results for input(s): TSH, T4TOTAL, FREET4, T3FREE, THYROIDAB in the last 72 hours. Anemia Panel: No results for input(s): VITAMINB12, FOLATE, FERRITIN, TIBC, IRON, RETICCTPCT in the last 72 hours. Urine analysis:    Component Value Date/Time   COLORURINE STRAW (A) 09/16/2019 1322   APPEARANCEUR HAZY (A) 09/16/2019 1322   LABSPEC 1.009 09/16/2019 1322   PHURINE 5.0 09/16/2019 1322   GLUCOSEU NEGATIVE 09/16/2019 1322   HGBUR SMALL (A) 09/16/2019 1322   BILIRUBINUR NEGATIVE 09/16/2019 1322   KETONESUR NEGATIVE 09/16/2019 1322   PROTEINUR 30 (A) 09/16/2019 1322   NITRITE NEGATIVE 09/16/2019 1322   LEUKOCYTESUR NEGATIVE 09/16/2019 1322   Sepsis Labs: @LABRCNTIP (procalcitonin:4,lacticidven:4)  ) Recent Results (from the past 240 hour(s))  SARS CORONAVIRUS 2 (TAT 6-24 HRS) Nasopharyngeal Nasopharyngeal Swab     Status: None   Collection Time: 09/16/19  4:13 AM   Specimen: Nasopharyngeal Swab  Result Value Ref Range Status   SARS Coronavirus 2 NEGATIVE NEGATIVE  Final    Comment: (NOTE) SARS-CoV-2 target nucleic acids are NOT DETECTED. The SARS-CoV-2 RNA is generally detectable in upper and lower respiratory specimens during the acute phase of infection. Negative results do not preclude SARS-CoV-2 infection, do not rule out co-infections with other pathogens, and should not be used as the sole basis for treatment or other patient management decisions. Negative results must be combined with clinical observations, patient history, and epidemiological information. The expected result is Negative. Fact Sheet for Patients: SugarRoll.be Fact Sheet for Healthcare Providers: https://www.woods-mathews.com/ This test is not yet approved or cleared by the [place] FDA and  has been authorized for detection and/or diagnosis of SARS-CoV-2 by FDA under an Emergency Use Authorization (EUA). This EUA will remain  in effect (meaning this test can be used) for the duration of the COVID-19 declaration under Section 56 4(b)(1) of the Act, 21 U.S.C. section 360bbb-3(b)(1), unless the authorization is terminated or revoked sooner. Performed at [facility], [place] [address] [place], [place] [address]   Culture, blood (routine x 2)     Status: None (Preliminary result)   Collection Time: 09/16/19  5:20 AM   Specimen: BLOOD  Result Value Ref Range Status   Specimen Description BLOOD LEFT HAND  Final   Special Requests   Final    BOTTLES DRAWN AEROBIC ONLY Blood Culture results may not be optimal due to an inadequate volume of blood received in culture bottles   Culture   Final    NO GROWTH 4 DAYS Performed at [facility], [place] [address], [place], [place] [address]    Report Status PENDING  Incomplete  Culture, blood (routine x 2)     Status: None (Preliminary result)   Collection Time: 09/16/19  5:26 AM   Specimen: BLOOD  Result Value Ref Range Status   Specimen Description BLOOD LEFT HAND  Final    Special Requests   Final    BOTTLES DRAWN AEROBIC ONLY Blood Culture adequate volume   Culture   Final    NO GROWTH 4 DAYS Performed at [facility], [place] [address], [place], [place] [address]    Report Status PENDING  Incomplete      Radiology Studies: DG CHEST PORT 1 VIEW  Result Date: 09/20/2019 CLINICAL DATA:  Shortness of breath.  Heart failure.  Hypertension. EXAM: PORTABLE CHEST 1 VIEW COMPARISON:  1 day prior FINDINGS: Midline trachea. Mild cardiomegaly. Atherosclerosis in the transverse aorta. Probable layering small bilateral pleural effusions, similar. No pneumothorax. No congestive  failure. Right greater than left base subsegmental atelectasis. Similar on the right and slightly improved on the left. IMPRESSION: Minimally improved left base aeration. Otherwise, similar layering bilateral pleural effusions and bibasilar atelectasis. Aortic Atherosclerosis (ICD10-I70.0). Electronically Signed   By: Abigail Miyamoto M.D.   On: 09/20/2019 08:50     Scheduled Meds: . aspirin EC  81 mg Oral Daily  . atorvastatin  80 mg Oral q1800  . B-complex with vitamin C  1 tablet Oral Daily  . clopidogrel  75 mg Oral Daily  . feeding supplement (NEPRO CARB STEADY)  237 mL Oral BID BM  . guaiFENesin  1,200 mg Oral BID  . heparin injection (subcutaneous)  5,000 Units Subcutaneous Q8H  . isosorbide-hydrALAZINE  1 tablet Oral TID  . torsemide  100 mg Oral Daily   Continuous Infusions:   LOS: 5 days   Time Spent in minutes   45 minutes  Arlis Yale D.O. on 09/21/2019 at 10:20 AM  Between 7am to 7pm - Please see pager noted on [email or web address]  After 7pm go to [email or web address]  And look for the night coverage person covering for me after hours  [facility] Office  [phone]

## 2019-09-21 NOTE — Progress Notes (Signed)
Progress Note  Patient Name: Tanner Pearson Date of Encounter: 09/21/2019  Primary Cardiologist: Jenne Campus, MD   Subjective   Has continued brisk diuresis despite switching to PO loop diuretic. Weight is now substantially lower than his previously estimated "dry weight". BUN and creatinine are high and unchanged from yesterday.  Inpatient Medications    Scheduled Meds: . aspirin EC  81 mg Oral Daily  . atorvastatin  80 mg Oral q1800  . B-complex with vitamin C  1 tablet Oral Daily  . clopidogrel  75 mg Oral Daily  . feeding supplement (NEPRO CARB STEADY)  237 mL Oral BID BM  . guaiFENesin  1,200 mg Oral BID  . heparin injection (subcutaneous)  5,000 Units Subcutaneous Q8H  . isosorbide-hydrALAZINE  1 tablet Oral TID  . torsemide  100 mg Oral Daily   Continuous Infusions:  PRN Meds: acetaminophen **OR** acetaminophen   Vital Signs    Vitals:   09/20/19 1226 09/20/19 1653 09/20/19 2017 09/21/19 0418  BP: 114/66 114/72 (!) 107/59 107/60  Pulse: 87 80 86 85  Resp: 16 16 19 19   Temp: (!) 97.5 F (36.4 C) 97.8 F (36.6 C) 97.8 F (36.6 C) 98.1 F (36.7 C)  TempSrc: Oral Oral Oral Oral  SpO2: 99% 100% 100% 100%  Weight:    60.6 kg  Height:        Intake/Output Summary (Last 24 hours) at 09/21/2019 0746 Last data filed at 09/21/2019 0400 Gross per 24 hour  Intake 245 ml  Output 1425 ml  Net -1180 ml   Last 3 Weights 09/21/2019 09/20/2019 09/19/2019  Weight (lbs) 133 lb 11.2 oz 135 lb 9.6 oz 140 lb 6.9 oz  Weight (kg) 60.646 kg 61.508 kg 63.7 kg      Telemetry    NSR, PACs, PVCs - Personally Reviewed  ECG    No new tracing - Personally Reviewed  Physical Exam  Elderly, frail GEN: No acute distress.   Neck: No JVD Cardiac: RRR, 2/6 mid-peaking systolic murmur aortic focus no diastolic murmurs, rubs, or gallops.  Respiratory: Clear to auscultation bilaterally. GI: Soft, nontender, non-distended  MS: No edema; No deformity. Neuro:  Nonfocal  Psych:  Normal affect   Labs    High Sensitivity Troponin:   Recent Labs  Lab 09/16/19 0517 09/16/19 0956  TROPONINIHS 12,172* 11,574*      Chemistry Recent Labs  Lab 09/18/19 0252 09/18/19 0252 09/19/19 0415 09/20/19 0508 09/21/19 0359  NA 141   < > 140 142 142  K 4.3   < > 4.9 4.1 4.3  CL 107   < > 103 102 101  CO2 16*   < > 19* 21* 22  GLUCOSE 133*   < > 128* 133* 132*  BUN 136*   < > 138* 140* 140*  CREATININE 5.67*   < > 5.69* 5.41* 5.60*  CALCIUM 8.3*   < > 8.5* 8.5* 8.6*  PROT 6.1*  --  6.2* 6.4*  --   ALBUMIN 3.3*   < > 3.4* 3.5 3.4*  AST 21  --  27 18  --   ALT 21  --  24 21  --   ALKPHOS 52  --  50 50  --   BILITOT 0.3  --  0.5 0.8  --   GFRNONAA 8*   < > 8* 9* 8*  GFRAA 10*   < > 9* 10* 10*  ANIONGAP 18*   < > 18* 19* 19*   < > =  values in this interval not displayed.     Hematology Recent Labs  Lab 09/19/19 0415 09/20/19 0508 09/21/19 0359  WBC 10.1 9.9 10.7*  RBC 3.09* 2.84* 2.95*  HGB 10.0* 9.4* 9.4*  HCT 30.9* 28.0* 29.1*  MCV 100.0 98.6 98.6  MCH 32.4 33.1 31.9  MCHC 32.4 33.6 32.3  RDW 14.8 14.6 14.9  PLT 119* 125* 140*    BNPNo results for input(s): BNP, PROBNP in the last 168 hours.   DDimer No results for input(s): DDIMER in the last 168 hours.   Radiology    DG CHEST PORT 1 VIEW  Result Date: 09/20/2019 CLINICAL DATA:  Shortness of breath.  Heart failure.  Hypertension. EXAM: PORTABLE CHEST 1 VIEW COMPARISON:  1 day prior FINDINGS: Midline trachea. Mild cardiomegaly. Atherosclerosis in the transverse aorta. Probable layering small bilateral pleural effusions, similar. No pneumothorax. No congestive failure. Right greater than left base subsegmental atelectasis. Similar on the right and slightly improved on the left. IMPRESSION: Minimally improved left base aeration. Otherwise, similar layering bilateral pleural effusions and bibasilar atelectasis. Aortic Atherosclerosis (ICD10-I70.0). Electronically Signed   By: Abigail Miyamoto M.D.   On:  09/20/2019 08:50    Cardiac Studies   Echo this admission 1. Left ventricular ejection fraction, by visual estimation, is <20%. The left ventricle has severely decreased function. There is no left ventricular hypertrophy.  2. Definity contrast agent was given IV to delineate the left ventricular endocardial borders.  3. Elevated left atrial pressure.  4. Left ventricular diastolic parameters are consistent with Grade II diastolic dysfunction (pseudonormalization).  5. Moderately dilated left ventricular internal cavity size.  6. The left ventricle demonstrates global hypokinesis.  7. Global right ventricle has moderately reduced systolic function.The right ventricular size is mildly enlarged. No increase in right ventricular wall thickness.  8. Left atrial size was severely dilated.  9. Right atrial size was normal.  10. The mitral valve is abnormal. Mild mitral valve regurgitation.   12. The tricuspid valve is grossly normal. Tricuspid valve regurgitation is mild.  13. The aortic valve is abnormal. Aortic valve regurgitation is not visualized.  14. AV is thickence, calcified with restricted motion Peak and mean gradients through the valve are 24 and 15 mm Hg respectively LVOT/AV VTI 0.35 consistent with moderate AS.  15. The pulmonic valve was not well visualized. Pulmonic valve regurgitation is not visualized.  16. The inferior vena cava is dilated in size with >50% respiratory variability, suggesting right atrial pressure of 8 mmHg.  17. The interatrial septum was not well visualized.   Patient Profile     86 y.o. male withCAD(NSTEMI 07/2015 managed medically), ischemic cardiomyopathy/chronic combined CHF, hypertension, hyperlipidemia,chronic appearing anemia,and stage VCKDwho was admitted to [facility] with increasing SOB, found to have elevated BNP, d-dimer, Cr of 5.8, and elevated troponin. 2D Echo1/31/2021 which revealed EF is now less than 20%, grade 2  DD, mild MR, severe LAE, moderate aortic stenosis(EF previously 35-40%).  Assessment & Plan    1. Acute on chronic combined CHF with worsening cardiomyopathy:continued excellent diuresis. Reduce torsemide to once daily. Appears euvolemic. Set "dry weight" at 134-134 lb. From hemodynamic point of view, he appears ready for DC, with plans for early f/u in 1 week and repeat BMET.  2. AKI superimposed on CKD stage IV-V - "new" baseline renal function around 5.6. Declined HD.  3. NSTEMI (here with new LBBB as well):not a candidate for invasive evaluation or PCI. Continue ASA, clopidogrel, statin. He does not have  angina.  4. Essential [id number] dose of Bidil.  5. Hyperlipidemia:switched to atorvastatin.  6. Chronic anemia: normo/macrocytic indices, likely due to renal failure.  7. Moderate AS: gradients underestimate severity due to low EF, but non-severe AS; dimensionless index 0.35, squarely in moderate range, not likely to be a cause of the worsening CMP.  If he is safe for DC from physical therapy/safety point of view, can go home.  [facility] will sign off.   Medication Recommendations:  Torsemide 100 mg daily (stop furosemide); BiDil one PO 3 x daily; ASA 81 mg and clopidogrel 75 mg daily;  atorvastatin 80 mg daily. Other recommendations (labs, testing, etc):  resume carvedilol as an outpatient, BMET in 1 week Follow up as an outpatient:  Dr. Agustin Cree TOC on 09/27/2019 scheduled.      For questions or updates, please contact [place] Please consult [email or web address] for contact info under        Signed, Sanda Klein, MD  09/21/2019, 7:46 AM

## 2019-09-21 NOTE — Progress Notes (Signed)
Daily Progress Note   Patient Name: Tanner Pearson       Date: 09/21/2019 DOB: 30-Nov-1928  Age: 86 y.o. MRN#: [id number] Attending Physician: Cristal Ford, DO Primary Care Physician: Ernestene Kiel, MD Admit Date: 09/16/2019  Reason for Consultation/Follow-up: Establishing goals of care  Subjective: Patient awake, alert, oriented. Working with PT this afternoon. No complaints. Son at bedside.   Spoke with daughter, Arrie Aran via telephone to update on conversation with her father and brother yesterday. Dawn understands and respects decisions that were made on MOST form. She is hopeful for her father to return home with home health services/outpatient palliative referral on discharge.   Dawn is wondering if her father is a candidate for home oxygen. Explained that current sats are 100% on RA but nursing staff will likely ambulate him on room air and assess oxygen saturation to see if he qualifies for home oxygen.   Patient's biggest fear is not being able to breath, especially at home alone. Dawn is worried if he does not qualify for home oxygen, he will become very anxious if dyspnea/fluid overload happens again at home. Discussed low dose anxiety medication (also discussed with patient) who is agreeable. Explained that they will need to f/u with outpatient PCP regarding continued management/prescription.   Answered questions/concerns.  Likely d/c in the next few days.    Length of Stay: 5  Current Medications: Scheduled Meds:  . aspirin EC  81 mg Oral Daily  . atorvastatin  80 mg Oral q1800  . B-complex with vitamin C  1 tablet Oral Daily  . clopidogrel  75 mg Oral Daily  . feeding supplement (NEPRO CARB STEADY)  237 mL Oral BID BM  . guaiFENesin  1,200 mg Oral BID  . heparin  injection (subcutaneous)  5,000 Units Subcutaneous Q8H  . isosorbide-hydrALAZINE  1 tablet Oral TID  . torsemide  100 mg Oral Daily    Continuous Infusions:   PRN Meds: acetaminophen **OR** acetaminophen  Physical Exam Vitals and nursing note reviewed.  Constitutional:      General: He is awake.  HENT:     Head: Normocephalic and atraumatic.  Cardiovascular:     Heart sounds: Murmur present.  Pulmonary:     Effort: No tachypnea, accessory muscle usage or respiratory distress.     Comments: 2L Skin:  General: Skin is warm and dry.  Neurological:     Mental Status: He is alert and oriented to person, place, and time.  Psychiatric:        Attention and Perception: Attention normal.        Mood and Affect: Mood normal.        Speech: Speech normal.        Behavior: Behavior normal.        Cognition and Memory: Cognition normal.            Vital Signs: BP 102/65 (BP Location: Left Arm)   Pulse 85   Temp 98 F (36.7 C)   Resp 18   Ht 5\' 10"  (1.778 m)   Wt 60.6 kg   SpO2 100%   BMI 19.18 kg/m  SpO2: SpO2: 100 % O2 Device: O2 Device: Room Air(asked pt to take off oxygen to get baseline) O2 Flow Rate: O2 Flow Rate (L/min): 2 L/min  Intake/output summary:   Intake/Output Summary (Last 24 hours) at 09/21/2019 1450 Last data filed at 09/21/2019 1300 Gross per 24 hour  Intake 240 ml  Output 1000 ml  Net -760 ml   LBM: Last BM Date: 09/19/19 Baseline Weight: Weight: 64.8 kg Most recent weight: Weight: 60.6 kg       Palliative Assessment/Data: PPS 50%    Flowsheet Rows     Most Recent Value  Intake Tab  Referral Department  Hospitalist  Unit at Time of Referral  Cardiac/Telemetry Unit  Palliative Care Primary Diagnosis  Other (Comment) [CHF/CKD]  Palliative Care Type  New Palliative care  Reason for referral  Clarify Goals of Care  Date first seen by Palliative Care  09/19/19  Clinical Assessment  Palliative Performance Scale Score  50%  Psychosocial &  Spiritual Assessment  Palliative Care Outcomes  Patient/Family meeting held?  Yes  Who was at the meeting?  patient, spoke with daughter via telephone  Palliative Care Outcomes  Clarified goals of care, Provided end of life care assistance, Provided psychosocial or spiritual support, ACP counseling assistance, Linked to palliative care logitudinal support      Patient Active Problem List   Diagnosis Date Noted  . Protein-calorie malnutrition, severe 09/21/2019  . DNR (do not resuscitate)   . Palliative care by specialist   . Goals of care, counseling/discussion   . DNR (do not resuscitate) discussion   . Pulmonary edema 09/16/2019  . CHF exacerbation ([place]) 09/16/2019  . Acute renal failure superimposed on stage 4 chronic kidney disease ([place]) 09/16/2019  . Coronary artery disease involving native coronary artery of native heart without angina pectoris   . NSTEMI (non-ST elevated myocardial infarction) ([place])   . Essential hypertension 08/12/2015  . Hyperlipidemia 08/12/2015  . Chronic kidney disease (CKD), stage IV (severe) ([place]) 08/12/2015  . Chronic combined systolic and diastolic congestive heart failure ([place]) 08/12/2015  . Dyspnea 08/12/2015  . Acute on chronic combined systolic (congestive) and diastolic (congestive) heart failure ([place]) 08/12/2015  . Shortness of breath   . Cardiomyopathy ([place]) 04/02/2015  . Chronic cholecystitis 04/02/2015    Palliative Care Assessment & Plan   Patient Profile: 86 y.o. male  with past medical history of CKD stage IV followed by Dr. [name] with right forearm AV fistula (never started dialysis), CHF with EF 35-40% from ECHO in 2016, HTN, HLD, PAD, anemia, GERD, gout admitted on 09/16/2019 with shortness of breath from [facility] ED. Reports orthopnea. Hospital admission for acute hypoxemic respiratory failure secondary to pulmonary edema in  the setting of acute on chronic combined CHF EF <20%, worsening CKD, aortic stenosis. Aggressively diuresing.  Nephrology and cardiology following. Patient is a poor candidate for hemodialysis. Palliative medicine consultation for goals of care.   Assessment: Acute hypoxemic respiratory failure Pulmonary edema Acute on chronic combined systolic and diastolic CHF AKI on CKD IV Aortic stenosis Demand ischemia CAD Normocytic anemia Thrombocytopenia  Recommendations/Plan:  MOST form completed with patient and son on 09/20/19. Patient's decisions include: DNR/DNI, limited additional interventions including re-hospitalization if indicated, CPAP/BiPAP if indicated, IVF/ABX for time trial if indicated, and NO feeding tube. Durable DNR completed. Copies of MOST and durable placed in chart and given to patient/son.   Requested copy of patient's advance directive.   Continue medical management. Medically optimize with plan to discharge home with outpatient palliative referral. TOC notified. This can transition to home hospice services when patient/children ready.   Patient declines dialysis, understanding poor candidacy for dialysis and that this will likely not impact quality of life.   Recommend xanax 0.25mg  daily prn anxiety.    Code Status: DNR/DNI   Code Status Orders  (From admission, onward)         Start     Ordered   09/16/19 0455  Full code  Continuous     09/16/19 0458        Code Status History    Date Active Date Inactive Code Status Order ID Comments User Context   08/12/2015 0828 08/15/2015 1917 Partial Code [id number]  Willia Craze, NP Inpatient   Advance Care Planning Activity       Prognosis:  Poor long-term prognosis  Discharge Planning:  Home with [place] was discussed with patient, daughter, son, RN, Dr. Ree Kida  Thank you for allowing the Palliative Medicine Team to assist in the care of this patient.   Time In: 1430- 1500- Time Out: 1450 1510 Total Time 30 Prolonged Time Billed no      Greater than 50%  of this time was spent  counseling and coordinating care related to the above assessment and plan.  Ihor Dow, DNP, FNP-C Palliative Medicine Team  Phone: [phone] Fax: [phone]  Please contact Palliative Medicine Team phone at [phone] for questions and concerns.

## 2019-09-21 NOTE — Evaluation (Signed)
Physical Therapy Evaluation Patient Details Name: Tanner Pearson MRN: [id number] DOB: 1929-01-21 Today's Date: 09/21/2019   History of Present Illness  Pt is an 86 y.o. male admitted 09/16/19 with SOB; worked up for acute hypoxic respiratory failure with pulmonary edema in the setting of CHF exacerbation with worsening CKD; found to EF less than 20% and worsening diastolic dysfunction. Tested (-) COVID-19. PMH includes CKD IV, HTN, HLD, CHF, PAD, gout.    Clinical Impression  Pt presents with an overall decrease in functional mobility secondary to above. PTA, pt independent, lives alone, drives. Today, pt able to perform gait training with assist for balance; stability improved with use of RW. DOE up to 3/4 with ambulation, SpO2 >/96% with activity. Pt preparing for potential d/c home tomorrow; son present and report family plans to provide initial 24/7 support. Educ re: activity recommendations, energy conservation and importance of mobility. Will follow acutely to address established goals.    Follow Up Recommendations Home health PT;Supervision/Assistance - 24 hour    Equipment Recommendations  Rolling walker with 5" wheels    Recommendations for Other Services       Precautions / Restrictions Precautions Precautions: Fall Restrictions Weight Bearing Restrictions: No      Mobility  Bed Mobility Overal bed mobility: Independent                Transfers Overall transfer level: Needs assistance Equipment used: None Transfers: Sit to/from Stand Sit to Stand: Min guard            Ambulation/Gait Ambulation/Gait assistance: Min guard;Min assist Gait Distance (Feet): 120 Feet Assistive device: None;Rolling walker (2 wheeled) Gait Pattern/deviations: Step-through pattern;Decreased stride length;Trunk flexed Gait velocity: Decreased Gait velocity interpretation: <1.8 ft/sec, indicate of risk for recurrent falls General Gait Details: Slow, mildly unsteady gait in room  without DME; 1x lateral LOB requiring minA to correct, pt reaching to furniture for added stability. Trialled use of RW for hallway ambulation, stability improved with intermittent min guard for balance; cues to maintain closer proximity and upright posture with RW. SpO2 >/96% on RA  Stairs            Wheelchair Mobility    Modified Rankin (Stroke Patients Only)       Balance Overall balance assessment: Needs assistance   Sitting balance-Leahy Scale: Good Sitting balance - Comments: Indep to don socks sitting EOB     Standing balance-Leahy Scale: Fair Standing balance comment: Can static stand without UE support; dynamic stability improved with RW                             Pertinent Vitals/Pain Pain Assessment: No/denies pain    Home Living Family/patient expects to be discharged to:: Private residence Living Arrangements: Alone Available Help at Discharge: Family;Available 24 hours/day Type of Home: House Home Access: Stairs to enter   [employer] of Steps: 1 Home Layout: One level Home Equipment: Shower seat Additional Comments: Son and daughter live nearby, plan to provide initial 24/7 assist    Prior Function Level of Independence: Independent         Comments: Independent with household and community ambulation, drives     Hand Dominance        Extremity/Trunk Assessment   Upper Extremity Assessment Upper Extremity Assessment: Generalized weakness    Lower Extremity Assessment Lower Extremity Assessment: Generalized weakness    Cervical / Trunk Assessment Cervical / Trunk Assessment: Kyphotic  Communication  Communication: No difficulties  Cognition Arousal/Alertness: Awake/alert Behavior During Therapy: WFL for tasks assessed/performed Overall Cognitive Status: Within Functional Limits for tasks assessed                                 General Comments: WFL for simple tasks, not formally assessed       General Comments General comments (skin integrity, edema, etc.): Son present and supportive - discussed d/c plans and recommendation for initial 24/7 - son and daughter plan to provide this    Exercises     Assessment/Plan    PT Assessment Patient needs continued PT services  PT Problem List Decreased strength;Decreased activity tolerance;Decreased balance;Decreased mobility;Decreased knowledge of use of DME;Cardiopulmonary status limiting activity       PT Treatment Interventions DME instruction;Gait training;Stair training;Functional mobility training;Therapeutic activities;Therapeutic exercise;Balance training;Patient/family education    PT Goals (Current goals can be found in the Care Plan section)  Acute Rehab PT Goals Patient Stated Goal: Home tomorrow PT Goal Formulation: With patient Time For Goal Achievement: 10/05/19 Potential to Achieve Goals: Good    Frequency Min 3X/week   Barriers to discharge        Co-evaluation               AM-PAC PT "6 Clicks" Mobility  Outcome Measure Help needed turning from your back to your side while in a flat bed without using bedrails?: None Help needed moving from lying on your back to sitting on the side of a flat bed without using bedrails?: None Help needed moving to and from a bed to a chair (including a wheelchair)?: A Little Help needed standing up from a chair using your arms (e.g., wheelchair or bedside chair)?: A Little Help needed to walk in hospital room?: A Little Help needed climbing 3-5 steps with a railing? : A Little 6 Click Score: 20    End of Session Equipment Utilized During Treatment: Gait belt Activity Tolerance: Patient tolerated treatment well Patient left: in bed;with call bell/phone within reach;with bed alarm set;with family/visitor present Nurse Communication: Mobility status PT Visit Diagnosis: Other abnormalities of gait and mobility (R26.89);Muscle weakness (generalized) (M62.81)     Time: [id number] PT Time Calculation (min) (ACUTE ONLY): 24 min   Charges:   PT Evaluation $PT Eval Moderate Complexity: 1 Mod PT Treatments $Gait Training: 8-22 mins      Mabeline Caras, PT, DPT Acute Rehabilitation Services  Pager [phone] Office [place] 09/21/2019, 5:34 PM

## 2019-09-21 NOTE — Plan of Care (Addendum)
Plan of care reviewed with pt at bedside. Pt denies needs at this time, will continue to monitor. Call bell in reach. Safety measures in place. PT/OT eval before dc tomorrow.  1213: MD notified about soft BP.  1421: Pt refused walk test for oxygen need. Oxygen was taken off while in bed and pt is 99% on ra.  1526: Pt son in room. Updated on care. Pt working with Physical therapy ambulating in hall. Oxygen staying >94% on ra while ambulating.  Problem: Education: Goal: Knowledge of General Education information will improve Description: Including pain rating scale, medication(s)/side effects and non-pharmacologic comfort measures Outcome: Progressing   Problem: Health Behavior/Discharge Planning: Goal: Ability to manage health-related needs will improve Outcome: Progressing   Problem: Clinical Measurements: Goal: Ability to maintain clinical measurements within normal limits will improve Outcome: Progressing Goal: Will remain free from infection Outcome: Progressing Goal: Diagnostic test results will improve Outcome: Progressing Goal: Respiratory complications will improve Outcome: Progressing Goal: Cardiovascular complication will be avoided Outcome: Progressing   Problem: Activity: Goal: Risk for activity intolerance will decrease Outcome: Progressing   Problem: Nutrition: Goal: Adequate nutrition will be maintained Outcome: Progressing   Problem: Coping: Goal: Level of anxiety will decrease Outcome: Progressing   Problem: Elimination: Goal: Will not experience complications related to bowel motility Outcome: Progressing Goal: Will not experience complications related to urinary retention Outcome: Progressing   Problem: Pain Managment: Goal: General experience of comfort will improve Outcome: Progressing   Problem: Safety: Goal: Ability to remain free from injury will improve Outcome: Progressing   Problem: Skin Integrity: Goal: Risk for impaired skin  integrity will decrease Outcome: Progressing   Problem: Education: Goal: Ability to demonstrate management of disease process will improve Outcome: Progressing Goal: Ability to verbalize understanding of medication therapies will improve Outcome: Progressing Goal: Individualized Educational Video(s) Outcome: Progressing   Problem: Activity: Goal: Capacity to carry out activities will improve Outcome: Progressing   Problem: Cardiac: Goal: Ability to achieve and maintain adequate cardiopulmonary perfusion will improve Outcome: Progressing

## 2019-09-21 NOTE — Progress Notes (Signed)
[facility] Progress Note    Assessment/ Plan:    SOB/ pulm edema/acute CHF exacerbation: much better volume status now.  D/w Dr Sallyanne Kuster.  Transitioned to PO torsemide 100 mg BID--> 100 mg daily.    AKI on CKD IV - baseline creat 3.8- 4. Cr up to 5.77 and BUN 130s but isn't uremic.  Dr [name] has had talks with pt about dialysis and I have had talks with him too--> he has declined dialysis and I think this is reasonable as it is unlikely to improve QOL.   HTN - agree w/ holding home coreg and hydralazine, BP's  normal   HL  H/o CAD  H/o combined syst/ diast CHF  Dispo: OK from renal perspective to d/c today, will set up f/u appt with Dr. [name] if needs arise  Subjective:    Torsemide decreased to 100 mg daily.     Objective:   BP 107/60 (BP Location: Left Arm)   Pulse 85   Temp 98.1 F (36.7 C) (Oral)   Resp 19   Ht 5\' 10"  (1.778 m)   Wt 60.6 kg   SpO2 100%   BMI 19.18 kg/m   Intake/Output Summary (Last 24 hours) at 09/21/2019 1048 Last data filed at 09/21/2019 0900 Gross per 24 hour  Intake 240 ml  Output 1000 ml  Net -760 ml   Weight change: -0.862 kg  Physical Exam: Gen: NAD, sitting in bed CVS: RRR Resp: off O2, no crackles Abd: soft Ext: no real LE edema  Imaging: DG CHEST PORT 1 VIEW  Result Date: 09/20/2019 CLINICAL DATA:  Shortness of breath.  Heart failure.  Hypertension. EXAM: PORTABLE CHEST 1 VIEW COMPARISON:  1 day prior FINDINGS: Midline trachea. Mild cardiomegaly. Atherosclerosis in the transverse aorta. Probable layering small bilateral pleural effusions, similar. No pneumothorax. No congestive failure. Right greater than left base subsegmental atelectasis. Similar on the right and slightly improved on the left. IMPRESSION: Minimally improved left base aeration. Otherwise, similar layering bilateral pleural effusions and bibasilar atelectasis. Aortic Atherosclerosis (ICD10-I70.0). Electronically Signed   By: Abigail Miyamoto M.D.   On:  09/20/2019 08:50    Labs: BMET Recent Labs  Lab 09/16/19 0517 09/16/19 1352 09/17/19 0538 09/18/19 0252 09/19/19 0415 09/20/19 0508 09/21/19 0359  NA 139 139 140 141 140 142 142  K 5.0 4.8 4.3 4.3 4.9 4.1 4.3  CL 105 106 106 107 103 102 101  CO2 15* 15* 17* 16* 19* 21* 22  GLUCOSE 128* 119* 129* 133* 128* 133* 132*  BUN 106* 111* 121* 136* 138* 140* 140*  CREATININE 5.81* 5.94* 5.80* 5.67* 5.69* 5.41* 5.60*  CALCIUM 8.2* 8.1* 8.0* 8.3* 8.5* 8.5* 8.6*  PHOS  --  8.4* 7.8* 7.5* 7.7* 8.0* 8.6*   CBC Recent Labs  Lab 09/18/19 0252 09/19/19 0415 09/20/19 0508 09/21/19 0359  WBC 11.2* 10.1 9.9 10.7*  NEUTROABS 8.4* 7.8* 7.5  --   HGB 9.2* 10.0* 9.4* 9.4*  HCT 29.0* 30.9* 28.0* 29.1*  MCV 101.8* 100.0 98.6 98.6  PLT 117* 119* 125* 140*    Medications:    . aspirin EC  81 mg Oral Daily  . atorvastatin  80 mg Oral q1800  . B-complex with vitamin C  1 tablet Oral Daily  . clopidogrel  75 mg Oral Daily  . feeding supplement (NEPRO CARB STEADY)  237 mL Oral BID BM  . guaiFENesin  1,200 mg Oral BID  . heparin injection (subcutaneous)  5,000 Units Subcutaneous Q8H  .  isosorbide-hydrALAZINE  1 tablet Oral TID  . torsemide  100 mg Oral Daily      Madelon Lips, MD 09/21/2019, 10:48 AM

## 2019-09-22 ENCOUNTER — Telehealth: Payer: Self-pay | Admitting: Cardiology

## 2019-09-22 DIAGNOSIS — E43 Unspecified severe protein-calorie malnutrition: Secondary | ICD-10-CM

## 2019-09-22 LAB — CBC
HCT: 27.4 % — ABNORMAL LOW (ref 39.0–52.0)
Hemoglobin: 9.1 g/dL — ABNORMAL LOW (ref 13.0–17.0)
MCH: 32.3 pg (ref 26.0–34.0)
MCHC: 33.2 g/dL (ref 30.0–36.0)
MCV: 97.2 fL (ref 80.0–100.0)
Platelets: 131 10*3/uL — ABNORMAL LOW (ref 150–400)
RBC: 2.82 MIL/uL — ABNORMAL LOW (ref 4.22–5.81)
RDW: 15 % (ref 11.5–15.5)
WBC: 10.4 10*3/uL (ref 4.0–10.5)
nRBC: 0 % (ref 0.0–0.2)

## 2019-09-22 LAB — BASIC METABOLIC PANEL
Anion gap: 20 — ABNORMAL HIGH (ref 5–15)
BUN: 140 mg/dL — ABNORMAL HIGH (ref 8–23)
CO2: 18 mmol/L — ABNORMAL LOW (ref 22–32)
Calcium: 8.6 mg/dL — ABNORMAL LOW (ref 8.9–10.3)
Chloride: 100 mmol/L (ref 98–111)
Creatinine, Ser: 5.98 mg/dL — ABNORMAL HIGH (ref 0.61–1.24)
GFR calc Af Amer: 9 mL/min — ABNORMAL LOW (ref >60)
GFR calc non Af Amer: 8 mL/min — ABNORMAL LOW (ref >60)
Glucose, Bld: 163 mg/dL — ABNORMAL HIGH (ref 70–99)
Potassium: 4.3 mmol/L (ref 3.5–5.1)
Sodium: 138 mmol/L (ref 135–145)

## 2019-09-22 MED ORDER — TORSEMIDE 100 MG PO TABS: 100.0000 mg | ORAL_TABLET | Freq: Every day | ORAL | 1 refills | Status: AC

## 2019-09-22 MED ORDER — ALPRAZOLAM 0.25 MG PO TABS: 0.2500 mg | ORAL_TABLET | Freq: Every day | ORAL | 0 refills | Status: AC | PRN

## 2019-09-22 MED ORDER — ATORVASTATIN CALCIUM 80 MG PO TABS: 80.0000 mg | ORAL_TABLET | Freq: Every day | ORAL | 0 refills | Status: AC

## 2019-09-22 MED ORDER — CLOPIDOGREL BISULFATE 75 MG PO TABS: 75.0000 mg | ORAL_TABLET | Freq: Every day | ORAL | 1 refills | Status: AC

## 2019-09-22 MED ORDER — B COMPLEX-C PO TABS: 1.0000 | ORAL_TABLET | Freq: Every day | ORAL | Status: AC

## 2019-09-22 MED ORDER — NEPRO/CARBSTEADY PO LIQD: 237.0000 mL | Freq: Two times a day (BID) | ORAL | 0 refills | Status: AC

## 2019-09-22 NOTE — Evaluation (Signed)
Occupational Therapy Evaluation Patient Details Name: Tanner Pearson MRN: [id number] DOB: 1929-07-03 Today's Date: 09/22/2019    History of Present Illness Pt is an 86 y.o. male admitted 09/16/19 with SOB; worked up for acute hypoxic respiratory failure with pulmonary edema in the setting of CHF exacerbation with worsening CKD; found to EF less than 20% and worsening diastolic dysfunction. Tested (-) COVID-19. PMH includes CKD IV, HTN, HLD, CHF, PAD, gout.   Clinical Impression   OT eval and education complete.  Pts family will A as needed.    Follow Up Recommendations  No OT follow up;Supervision/Assistance - 24 hour          Precautions / Restrictions Precautions Precautions: Fall Restrictions Weight Bearing Restrictions: No      Mobility Bed Mobility Overal bed mobility: Modified Independent                Transfers   Equipment used: Rolling walker (2 wheeled) Transfers: Sit to/from [facility] Sit to Stand: Supervision Stand pivot transfers: Supervision            Balance Overall balance assessment: Needs assistance Sitting-balance support: No upper extremity supported;Feet supported Sitting balance-Leahy Scale: Good Sitting balance - Comments: modI   Standing balance support: Bilateral upper extremity supported Standing balance-Leahy Scale: Good Standing balance comment: supervision with UE support of RW                           ADL either performed or assessed with clinical judgement   ADL Overall ADL's : At baseline                                       General ADL Comments: pt states he is at baseline. Daughter will A pt as needed.  Pt plans to go home this day.  Do not feel follow uo OT indicated     Vision Patient Visual Report: No change from baseline              Pertinent Vitals/Pain Pain Assessment: No/denies pain           Communication Communication Communication: No  difficulties   Cognition Arousal/Alertness: Awake/alert Behavior During Therapy: WFL for tasks assessed/performed Overall Cognitive Status: Within Functional Limits for tasks assessed                                                Home Living Family/patient expects to be discharged to:: Private residence Living Arrangements: Alone Available Help at Discharge: Family;Available 24 hours/day Type of Home: House Home Access: Stairs to enter [employer] of Steps: 1   Home Layout: One level     Bathroom Shower/Tub: Tub/shower unit;Walk-in shower   Bathroom Toilet: Handicapped height     Home Equipment: Building services engineer Comments: Son and daughter live nearby, plan to provide initial 24/7 assist      Prior Functioning/Environment Level of Independence: Independent        Comments: Independent with household and community ambulation, drives                 OT Goals(Current goals can be found in the care plan section) Acute Rehab OT Goals Patient Stated Goal: to go home OT  Goal Formulation: With patient  OT Frequency:      AM-PAC OT "6 Clicks" Daily Activity     Outcome Measure Help from another person eating meals?: None Help from another person taking care of personal grooming?: None Help from another person toileting, which includes using toliet, bedpan, or urinal?: None Help from another person bathing (including washing, rinsing, drying)?: None Help from another person to put on and taking off regular upper body clothing?: None Help from another person to put on and taking off regular lower body clothing?: None 6 Click Score: 24   End of Session Equipment Utilized During Treatment: Rolling walker;Gait belt  Activity Tolerance: Patient tolerated treatment well Patient left: in chair;with call bell/phone within reach                   Time: 1126-1139 OT Time Calculation (min): 13 min Charges:  OT General Charges $OT  Visit: 1 Visit OT Evaluation $OT Eval Moderate Complexity: 1 Mod  Kari Baars, OT Acute Rehabilitation Services [phone] Office- [phone]     Conard Alvira, Edwena Felty D 09/22/2019, 5:17 PM

## 2019-09-22 NOTE — Telephone Encounter (Signed)
Arville Go, with [facility], is calling to request an order for home based palliative care for the patient from Dr. Agustin Cree. Please call to discuss at [phone] and ask for Manus Gunning.

## 2019-09-22 NOTE — Care Management Important Message (Signed)
Important Message  Patient Details  Name: Tanner Pearson MRN: [id number] Date of Birth: 02/04/1929   Medicare Important Message Given:  Yes     Shelda Altes 09/22/2019, 11:25 AM

## 2019-09-22 NOTE — TOC Progression Note (Addendum)
Transition of Care [facility]) - Progression Note    Patient Details  Name: RAYYAAN BEGGS MRN: [id number] Date of Birth: [birth year]  [facility] CM/SW Contact  Zenon Mayo, RN Phone Number: 09/22/2019, 9:52 AM  Clinical Narrative:    NCM spoke with patient regarding outpatient palliative services yesterday, he states to talk with daughter, NCM contacted daughter, she states she works with [facility] and yes she would like the outpatient palliative services to be with them.  NCM contacted [facility] and made referral for outpatient palliative services. Patient will also need HHPT  At discharge, NCM offered choice, he states he does not have a preference for [facility] services.  Will need HHPT, HHOT, HHAIDE.         Expected Discharge Plan and Services                                                 Social Determinants of Health (SDOH) Interventions    Readmission Risk Interventions No flowsheet data found.

## 2019-09-22 NOTE — Telephone Encounter (Signed)
Called [facility] spoke with Manus Gunning. Informed her that Dr. Agustin Cree hasn't seen patient since coming out of the hospital and can't advise on this. I also let them know he advised for it to come from pcp office.

## 2019-09-22 NOTE — Consult Note (Signed)
[facility] (A [facility] of [facility]) Rec'd referral from CM- Tomi Bamberger yesterday.  Phone contact made to discuss services with dtr Allison Quarry [phone].  Plan made for [facility] Admission visit next week to provide home based palliative care.  Thank you for this referral and the opportunity to provide support to this pt and family.  Arville Go N.Scott, RN

## 2019-09-22 NOTE — Discharge Instructions (Signed)

## 2019-09-22 NOTE — Discharge Summary (Signed)
Physician Discharge Summary  Tanner Pearson [id number] DOB: February 11, 1929 DOA: 09/16/2019  PCP: Tanner Kiel, MD  Admit date: 09/16/2019 Discharge date: 09/22/2019  Time spent: 45 minutes  Recommendations for Outpatient Follow-up:  Patient will be discharged to home with home health services and palliative care to follow.  Patient will need to follow up with primary care provider within one week of discharge.  Follow up with cardiology. Patient should continue medications as prescribed.  Patient should follow a renal diet.   Discharge Diagnoses:  Acute hypoxemic respiratory failure with pulmonary edema  Acute on chronic combined systolic and diastolic heart failure Acute kidney injury on chronic kidney disease, stage IV/metabolic acidosis NSTEMI type II Aortic stenosis Hyperlipidemia Essential hypertension Coronary artery disease Normocytic anemia/anemia of chronic disease Leukocytosis Thrombocytopenia  Goals of care Deconditioning Severe malnutrition   Discharge Condition: Stable  Diet recommendation: Renal diet   Filed Weights   09/20/19 0113 09/21/19 0418 09/22/19 0338  Weight: 61.5 kg 60.6 kg 60.6 kg    History of present illness:  On 09/16/2019 by Dr. Pricilla Pearson a 86 y.o.malewith medical history significant ofCKD stageIV followed by Dr. Lorrene Pearson and has a right forearm AV fistula in place but not started dialysis yet, hypertension, hyperlipidemia, CHF with echo done in April 2016 EF 35 to 40%, PAD, anemia, GERD, gout presented to [facility] ED on 1/29 for evaluation of shortness of breath.Patient reports 3-day history of shortness of breath and cough. Also reports orthopnea. No lower extremity edema. Denies fevers, chills, fatigue, body aches, vomiting, abdominal pain, or diarrhea. States he saw his daughter a week ago and she recently tested positive for COVID-19.  Hospital Course:  Acute hypoxemic respiratory failure with pulmonary  edema  -in setting of CHF exacerbation with worsening CKD -Covid was negative x2 -Initially thought patient had pneumonia and was placed on azithromycin and ceftriaxone however these were discontinued given that chest x-ray is more consistent with volume overload and patient is afebrile with minimal leukocytosis -Was placed on IV Lasix per nephrology -Continue supplemental oxygen, incentive spirometry -Would like to ambulate patient and monitor O2 saturations  Acute on chronic combined systolic and diastolic heart failure -Suspect contributing to patient's shortness of breath and pulmonary edema -EF is down to 20% with worsening grade 2 diastolic dysfunction as well as aortic stenosis -Cardiology consulted and appreciated, patient is not a candidate for ACE inhibitors ARB therapy or spironolactone given renal function -Continue to monitor intake and output, daily weights -Was initially placed on IV Lasix, however has been transitioned to oral torsemide 100 mg twice daily -Cardiology recommended: Torsemide 100 mg daily (stop furosemide); BiDil one PO 3 x daily; ASA 81 mg and clopidogrel 75 mg daily; atorvastatin 80 mg daily. Resume carvedilol as an outpatient, repeat BMP in one week. Follow up with Dr. Agustin Pearson.   Acute kidney injury on chronic kidney disease, stage IV/metabolic acidosis -Baseline creatinine approximately 3.8-4 -Nephrology consulted and appreciated -Renal ultrasound showed findings of chronic medical renal disease.  No evidence of hydronephrosis.  Small bilateral renal cysts.  No renal masses identified.  Diffuse bladder wall thickening, likely due to chronic bladder outlet obstruction given enlarged prostate. -UA obtained showing hazy appearance, negative nitrites, negative leukocytes, rare bacteria, negative ketones -Creatinine peaked 5.94, currently 5.60 -Patient has been transitioned to oral torsemide, 100 mg twice daily -Continue to monitor BMP -Of note, patient has  declined dialysis.  Palliative care also consulted.  NSTEMI type II -Likely demand ischemia in the setting of  acute illness as well as recent acute CHF exacerbation and acute kidney injury -Patient denies any chest pain or anginal symptoms -Echocardiogram as below -Cardiology consulted and appreciated-also noted to have new LBBB, however patient not a candidate for invasive evaluation or PCI -Troponin peaked at 12,000 172 -Continue statin, aspirin -Patient was on heparin drip however this was discontinued by cardiology.  Patient was placed on Plavix  Aortic stenosis -Caution with excessive diuresis/fluids given moderate to severe -Noted on echocardiogram -Cardiology following  Hyperlipidemia -Continue statin  Essential hypertension -BP stable, continue BiDil  Coronary artery disease -Currently no complaints of chest pain -Continue aspirin, statin, Plavix, Bidil  Normocytic anemia/anemia of chronic disease -Hemoglobin currently 9.4 -Anemia panel showed iron 40, adequate storage  Leukocytosis -Resolved, continue to monitor CBC  Thrombocytopenia  -Platelets improving, currently 140  Goals of care -Given patient's comorbidities with a creatinine >5, NSTEMI, etc, he is a poor candidate for invasive evaluation nor PCI or dialysis at this time. -Palliative care consulted and appreciated, patient now DNR. MOST form completed. Patient agreeable to outpatient palliative referral and wishes to return home.  Deconditioning -PT and OT consulted- home health, rolling walker  Severe malnutrition  -in the context of chronic illness (CKD, CHF) -Nutrition consulted, recommended supplements  Code status: DNR  Consultants Cardiology Nephrology Palliative Care  Procedures  Echocardiogram: 1. Left ventricular ejection fraction, by visual estimation, is <20%. Theleft ventricle has severely decreased function. There is no left ventricular hypertrophy.  2. Definity  contrast agent was given IV to delineate the left ventricular endocardial borders.  3. Elevated left atrial pressure.  4. Left ventricular diastolic parameters are consistent with Grade II diastolic dysfunction (pseudonormalization).  5. Moderately dilated left ventricular internal cavity size.  6. The left ventricle demonstrates global hypokinesis.  7. Global right ventricle has moderately reduced systolic function.The right ventricular size is mildly enlarged. No increase in right ventricular wall thickness.  8. Left atrial size was severely dilated.  9. Right atrial size was normal.  10. The mitral valve is abnormal. Mild mitral valve regurgitation.  12. The tricuspid valve is grossly normal. Tricuspid valve regurgitation is mild.  13. The aortic valve is abnormal. Aortic valve regurgitation is not visualized.  14. AV is thickence, calcified with restricted motion Peak and mean gradients through the valve are 24 and 15 mm Hg respectively LVOT/AV VTI 0.35 consistent with moderate AS.  15. The pulmonic valve was not well visualized. Pulmonic valve regurgitation is not visualized.  16. The inferior vena cava is dilated in size with >50% respiratory variability, suggesting right atrial pressure of 8 mmHg.  17. The interatrial septum was not well visualized.    Discharge Exam: Vitals:   09/22/19 0338 09/22/19 0911  BP: 101/60 100/62  Pulse: 97 89  Resp: 20   Temp: 98.1 F (36.7 C)   SpO2: 99% 99%     General: Well developed, well nourished, NAD, appears stated age  HEENT: NCAT, mucous membranes moist.  Cardiovascular: S1 S2 auscultated, RRR, SEM  Respiratory: Clear to auscultation bilaterally  Abdomen: Soft, nontender, nondistended, + bowel sounds  Extremities: warm dry without cyanosis clubbing or edema  Neuro: AAOx3, nonfocal  Psych: Appropriate mood and affect  Discharge Instructions Discharge Instructions    Discharge instructions   Complete by: As directed     Patient will be discharged to home with home health services and palliative care to follow.  Patient will need to follow up with primary care provider within one week of discharge.  Follow up with cardiology. Patient should continue medications as prescribed.  Patient should follow a renal diet.     Allergies as of 09/22/2019   No Known Allergies     Medication List    STOP taking these medications   carvedilol 25 MG tablet Commonly known as: COREG   furosemide 40 MG tablet Commonly known as: LASIX   pravastatin 40 MG tablet Commonly known as: PRAVACHOL     TAKE these medications   allopurinol 100 MG tablet Commonly known as: ZYLOPRIM Take 100 mg by mouth daily.   ALPRAZolam 0.25 MG tablet Commonly known as: XANAX Take 1 tablet (0.25 mg total) by mouth daily as needed for anxiety.   aspirin EC 81 MG tablet Take 81 mg by mouth daily.   atorvastatin 80 MG tablet Commonly known as: LIPITOR Take 1 tablet (80 mg total) by mouth daily at 6 PM.   B-complex with vitamin C tablet Take 1 tablet by mouth daily. Start taking on: September 23, 2019   BiDil 20-37.5 MG tablet Generic drug: isosorbide-hydrALAZINE Take 1 tablet by mouth 3 (three) times daily.   clopidogrel 75 MG tablet Commonly known as: PLAVIX Take 1 tablet (75 mg total) by mouth daily. Start taking on: September 23, 2019   feeding supplement (NEPRO CARB STEADY) Liqd Take 237 mLs by mouth 2 (two) times daily between meals.   torsemide 100 MG tablet Commonly known as: DEMADEX Take 1 tablet (100 mg total) by mouth daily. Start taking on: September 23, 2019            Durable Medical Equipment  (From admission, onward)         Start     Ordered   09/22/19 1010  DME Walker  Once    Question Answer Comment  Walker: With [place]   Patient needs a walker to treat with the following condition Physical deconditioning      09/22/19 1014   09/22/19 1008  For home use only DME Walker rolling  Once      Question Answer Comment  Walker: With [place] [employer]   Patient needs a walker to treat with the following condition Weakness      09/22/19 1007         No Known Allergies Follow-up Information    [facility] Follow up.   Why: rolling walker       Tanner Kiel, MD. Go on 10/03/2019.   Specialty: Internal Medicine Why: @10 :40am Contact information: [address] [place] [address] [phone]        Park Liter, MD. Schedule an appointment as soon as possible for a visit in 1 week(s).   Specialty: Cardiology Why: Follow up Contact information: [address] [place] [address] [phone]            The results of significant diagnostics from this hospitalization (including imaging, microbiology, ancillary and laboratory) are listed below for reference.    Significant Diagnostic Studies: DG Chest 1 View  Result Date: 09/16/2019 CLINICAL DATA:  Shortness of breath. EXAM: CHEST  1 VIEW COMPARISON:  Chest radiograph 09/15/2019 FINDINGS: Monitoring leads overlie the patient. Stable cardiac and mediastinal contours. Aortic atherosclerosis. Diffuse bilateral interstitial pulmonary opacities. No pleural effusion or pneumothorax. IMPRESSION: Diffuse bilateral interstitial pulmonary opacities may represent edema or atypical infection. Electronically Signed   By: Lovey Newcomer M.D.   On: 09/16/2019 09:35   US RENAL  Result Date: 09/16/2019 CLINICAL DATA:  Acute renal failure superimposed  on stage IV chronic kidney disease. Congestive heart failure and coronary artery disease. EXAM: RENAL / URINARY TRACT ULTRASOUND COMPLETE COMPARISON:  None. FINDINGS: Right Kidney: Renal measurements: 8.4 x 5.3 x 4.6 cm = volume: 107 mL. Diffuse renal parenchymal thinning and increased echogenicity. A few small cysts are seen, largest measuring 1.9 cm. No mass or hydronephrosis visualized. Left Kidney: Renal measurements: 9.0 x 5.9 x 4.8 cm = volume: 134 mL. Diffuse renal  parenchymal thinning and increased echogenicity. Several small cysts are seen, largest measuring 5.5 cm. No mass or hydronephrosis visualized. Bladder: Mild diffuse bladder wall thickening and enlarged prostate gland noted, likely due to chronic bladder outlet obstruction. Other: None. IMPRESSION: Findings of chronic medical renal disease. No evidence of hydronephrosis. Small bilateral renal cysts.  No renal masses identified. Diffuse bladder wall thickening, likely due to chronic bladder outlet obstruction given enlarged prostate. Electronically Signed   By: Marlaine Hind M.D.   On: 09/16/2019 14:03   DG CHEST PORT 1 VIEW  Result Date: 09/20/2019 CLINICAL DATA:  Shortness of breath.  Heart failure.  Hypertension. EXAM: PORTABLE CHEST 1 VIEW COMPARISON:  1 day prior FINDINGS: Midline trachea. Mild cardiomegaly. Atherosclerosis in the transverse aorta. Probable layering small bilateral pleural effusions, similar. No pneumothorax. No congestive failure. Right greater than left base subsegmental atelectasis. Similar on the right and slightly improved on the left. IMPRESSION: Minimally improved left base aeration. Otherwise, similar layering bilateral pleural effusions and bibasilar atelectasis. Aortic Atherosclerosis (ICD10-I70.0). Electronically Signed   By: Abigail Miyamoto M.D.   On: 09/20/2019 08:50   DG CHEST PORT 1 VIEW  Result Date: 09/19/2019 CLINICAL DATA:  Shortness of breath EXAM: PORTABLE CHEST 1 VIEW COMPARISON:  Yesterday FINDINGS: Mild cardiomegaly is stable. Stable haziness of the bilateral chest from layering pleural fluid and atelectasis. Interstitial edema is improved. IMPRESSION: Improved interstitial edema with persistent haziness from layering pleural fluid and atelectasis by recent CT. Electronically Signed   By: Monte Fantasia M.D.   On: 09/19/2019 06:29   DG CHEST PORT 1 VIEW  Result Date: 09/18/2019 CLINICAL DATA:  Shortness of breath. EXAM: PORTABLE CHEST 1 VIEW COMPARISON:  09/16/2019  FINDINGS: The heart is borderline enlarged. Stable tortuosity and calcification of the thoracic aorta. Persistent central vascular congestion and pulmonary edema. No focal infiltrates or large effusions. Prominent skin fold noted over the right upper chest. No pneumothorax. IMPRESSION: Persistent changes of CHF. Electronically Signed   By: Marijo Sanes M.D.   On: 09/18/2019 05:51   ECHOCARDIOGRAM COMPLETE  Result Date: 09/17/2019   ECHOCARDIOGRAM REPORT   Patient Name:   Tanner Pearson Date of Exam: 09/17/2019 Medical Rec #:  [id number]      Height:       70.0 in Accession #:    [id number]     Weight:       143.9 lb Date of Birth:  April 11, 1929     BSA:          1.81 m Patient Age:    9 years       BP:           117/90 mmHg Patient Gender: M              HR:           82 bpm. Exam Location:  Inpatient Procedure: 2D Echo Indications:    CHF 428  History:        Patient has prior history of Echocardiogram examinations, most  recent 03/23/2018. CHF, CAD; Risk Factors:Hypertension and                 Dyslipidemia.  Sonographer:    Jannett Celestine RDCS (AE) Referring Phys: [id number] VASUNDHRA [place]  1. Left ventricular ejection fraction, by visual estimation, is <20%. The left ventricle has severely decreased function. There is no left ventricular hypertrophy.  2. Definity contrast agent was given IV to delineate the left ventricular endocardial borders.  3. Elevated left atrial pressure.  4. Left ventricular diastolic parameters are consistent with Grade II diastolic dysfunction (pseudonormalization).  5. Moderately dilated left ventricular internal cavity size.  6. The left ventricle demonstrates global hypokinesis.  7. Global right ventricle has moderately reduced systolic function.The right ventricular size is mildly enlarged. No increase in right ventricular wall thickness.  8. Left atrial size was severely dilated.  9. Right atrial size was normal. 10. The mitral valve is abnormal. Mild  mitral valve regurgitation. 11. The tricuspid valve is grossly normal. 12. The tricuspid valve is grossly normal. Tricuspid valve regurgitation is mild. 13. The aortic valve is abnormal. Aortic valve regurgitation is not visualized. 14. AV is thickence, calcified with restricted motion Peak and mean gradients through the valve are 24 and 15 mm Hg respectively LVOT/AV VTI 0.35 consistent with moderate AS. 15. The pulmonic valve was not well visualized. Pulmonic valve regurgitation is not visualized. 16. The inferior vena cava is dilated in size with >50% respiratory variability, suggesting right atrial pressure of 8 mmHg. 17. The interatrial septum was not well visualized. FINDINGS  Left Ventricle: Left ventricular ejection fraction, by visual estimation, is <20%. The left ventricle has severely decreased function. Definity contrast agent was given IV to delineate the left ventricular endocardial borders. The left ventricle demonstrates global hypokinesis. The left ventricular internal cavity size was moderately dilated left ventricle. There is no left ventricular hypertrophy. Left ventricular diastolic parameters are consistent with Grade II diastolic dysfunction (pseudonormalization). Elevated left atrial pressure. Right Ventricle: The right ventricular size is mildly enlarged. No increase in right ventricular wall thickness. Global RV systolic function is has moderately reduced systolic function. Left Atrium: Left atrial size was severely dilated. Right Atrium: Right atrial size was normal in size Pericardium: There is no evidence of pericardial effusion. Mitral Valve: The mitral valve is abnormal. There is mild thickening of the mitral valve leaflet(s). Mild mitral valve regurgitation. Tricuspid Valve: The tricuspid valve is grossly normal. Tricuspid valve regurgitation is mild. Aortic Valve: The aortic valve is abnormal. Aortic valve regurgitation is not visualized. Aortic valve mean gradient measures 15.0 mmHg.  Aortic valve peak gradient measures 24.2 mmHg. Aortic valve area, by VTI measures 1.09 cm. AV is thickence, calcified with restricted motion Peak and mean gradients through the valve are 24 and 15 mm Hg respectively LVOT/AV VTI 0.35 consistent with moderate AS. Pulmonic Valve: The pulmonic valve was not well visualized. Pulmonic valve regurgitation is not visualized. Pulmonic regurgitation is not visualized. Aorta: The aortic root and ascending aorta are structurally normal, with no evidence of dilitation. Venous: The inferior vena cava is dilated in size with greater than 50% respiratory variability, suggesting right atrial pressure of 8 mmHg. IAS/Shunts: The interatrial septum was not well visualized.  LEFT VENTRICLE PLAX 2D LVIDd:         6.00 cm  Diastology LVIDs:         5.20 cm  LV e' lateral:   6.74 cm/s LV PW:         1.10 cm  LV E/e' lateral: 13.7 LV IVS:        1.00 cm  LV e' medial:    3.15 cm/s LVOT diam:     2.00 cm  LV E/e' medial:  29.2 LV SV:         50 ml LV SV Index:   28.21 LVOT Area:     3.14 cm  RIGHT VENTRICLE RV S prime:     7.94 cm/s TAPSE (M-mode): 1.5 cm LEFT ATRIUM             Index       RIGHT ATRIUM           Index LA diam:        4.00 cm 2.20 cm/m  RA Area:     12.00 cm LA Vol (A2C):   83.6 ml 46.07 ml/m RA Volume:   25.40 ml  14.00 ml/m LA Vol (A4C):   89.8 ml 49.48 ml/m LA Biplane Vol: 86.6 ml 47.72 ml/m  AORTIC VALVE AV Area (Vmax):    1.20 cm AV Area (Vmean):   1.20 cm AV Area (VTI):     1.09 cm AV Vmax:           246.00 cm/s AV Vmean:          183.000 cm/s AV VTI:            0.526 m AV Peak Grad:      24.2 mmHg AV Mean Grad:      15.0 mmHg LVOT Vmax:         93.80 cm/s LVOT Vmean:        69.800 cm/s LVOT VTI:          0.183 m LVOT/AV VTI ratio: 0.35  AORTA Ao Root diam: 3.30 cm MITRAL VALVE MV Area (PHT): 3.65 cm             SHUNTS MV PHT:        60.32 msec           Systemic VTI:  0.18 m MV Decel Time: 208 msec             Systemic Diam: 2.00 cm MV E velocity: 92.10  cm/s 103 cm/s MV A velocity: 74.60 cm/s 70.3 cm/s MV E/A ratio:  1.23       1.5  Dorris Carnes MD Electronically signed by Dorris Carnes MD Signature Date/Time: 09/17/2019/11:15:27 AM    Final    VAS [place] LOWER EXTREMITY VENOUS (DVT)  Result Date: 09/17/2019  Lower Venous Study Indications: SOB.  Risk Factors: CKD stage IV. Comparison Study: No prior study on file Performing Technologist: Sharion Dove RVS  Examination Guidelines: A complete evaluation includes B-mode imaging, spectral Doppler, color Doppler, and power Doppler as needed of all accessible portions of each vessel. Bilateral testing is considered an integral part of a complete examination. Limited examinations for reoccurring indications may be performed as noted.  +---------+---------------+---------+-----------+----------+--------------+ RIGHT    CompressibilityPhasicitySpontaneityPropertiesThrombus Aging +---------+---------------+---------+-----------+----------+--------------+ CFV      Full           Yes      Yes                                 +---------+---------------+---------+-----------+----------+--------------+ SFJ      Full                                                        +---------+---------------+---------+-----------+----------+--------------+  FV Prox  Full                                                        +---------+---------------+---------+-----------+----------+--------------+ FV Mid   Full                                                        +---------+---------------+---------+-----------+----------+--------------+ FV DistalFull                                                        +---------+---------------+---------+-----------+----------+--------------+ PFV      Full                                                        +---------+---------------+---------+-----------+----------+--------------+ POP      Full           Yes      Yes                                  +---------+---------------+---------+-----------+----------+--------------+ PTV      Full                                                        +---------+---------------+---------+-----------+----------+--------------+ PERO     Full                                                        +---------+---------------+---------+-----------+----------+--------------+   +---------+---------------+---------+-----------+----------+--------------+ LEFT     CompressibilityPhasicitySpontaneityPropertiesThrombus Aging +---------+---------------+---------+-----------+----------+--------------+ CFV      Full           Yes      Yes                                 +---------+---------------+---------+-----------+----------+--------------+ SFJ      Full                                                        +---------+---------------+---------+-----------+----------+--------------+ FV Prox  Full                                                        +---------+---------------+---------+-----------+----------+--------------+  FV Mid   Full                                                        +---------+---------------+---------+-----------+----------+--------------+ FV DistalFull                                                        +---------+---------------+---------+-----------+----------+--------------+ PFV      Full                                                        +---------+---------------+---------+-----------+----------+--------------+ POP      Full           Yes      Yes                                 +---------+---------------+---------+-----------+----------+--------------+ PTV      Full                                                        +---------+---------------+---------+-----------+----------+--------------+ PERO     Full                                                         +---------+---------------+---------+-----------+----------+--------------+     Summary: Right: There is no evidence of deep vein thrombosis in the lower extremity. Pulsatile waveforms noted, suggestive of fluid overload Left: There is no evidence of deep vein thrombosis in the lower extremity. Pulsatile waveforms noted, suggestive of fluid overload  *See table(s) above for measurements and observations. Electronically signed by Servando Snare MD on 09/17/2019 at 10:54:43 AM.    Final     Microbiology: Recent Results (from the past 240 hour(s))  SARS CORONAVIRUS 2 (TAT 6-24 HRS) Nasopharyngeal Nasopharyngeal Swab     Status: None   Collection Time: 09/16/19  4:13 AM   Specimen: Nasopharyngeal Swab  Result Value Ref Range Status   SARS Coronavirus 2 NEGATIVE NEGATIVE Final    Comment: (NOTE) SARS-CoV-2 target nucleic acids are NOT DETECTED. The SARS-CoV-2 RNA is generally detectable in upper and lower respiratory specimens during the acute phase of infection. Negative results do not preclude SARS-CoV-2 infection, do not rule out co-infections with other pathogens, and should not be used as the sole basis for treatment or other patient management decisions. Negative results must be combined with clinical observations, patient history, and epidemiological information. The expected result is Negative. Fact Sheet for Patients: SugarRoll.be Fact Sheet for Healthcare Providers: https://www.woods-mathews.com/ This test is not yet approved or cleared by the [place] FDA and  has been authorized for detection and/or diagnosis  of SARS-CoV-2 by FDA under an Emergency Use Authorization (EUA). This EUA will remain  in effect (meaning this test can be used) for the duration of the COVID-19 declaration under Section 56 4(b)(1) of the Act, 21 U.S.C. section 360bbb-3(b)(1), unless the authorization is terminated or revoked sooner. Performed at [facility], [place] [address] [place], [place] [address]   Culture, blood (routine x 2)     Status: None   Collection Time: 09/16/19  5:20 AM   Specimen: BLOOD  Result Value Ref Range Status   Specimen Description BLOOD LEFT HAND  Final   Special Requests   Final    BOTTLES DRAWN AEROBIC ONLY Blood Culture results may not be optimal due to an inadequate volume of blood received in culture bottles   Culture   Final    NO GROWTH 5 DAYS Performed at [facility], [place] [address], [place], [place] [address]    Report Status 09/21/2019 FINAL  Final  Culture, blood (routine x 2)     Status: None   Collection Time: 09/16/19  5:26 AM   Specimen: BLOOD  Result Value Ref Range Status   Specimen Description BLOOD LEFT HAND  Final   Special Requests   Final    BOTTLES DRAWN AEROBIC ONLY Blood Culture adequate volume   Culture   Final    NO GROWTH 5 DAYS Performed at [facility], [place] [address] [place], [place] [address]    Report Status 09/21/2019 FINAL  Final     Labs: Basic Metabolic Panel: Recent Labs  Lab 09/17/19 0538 09/17/19 0538 09/18/19 0252 09/19/19 0415 09/20/19 0508 09/21/19 0359 09/22/19 0349  NA 140   < > 141 140 142 142 138  K 4.3   < > 4.3 4.9 4.1 4.3 4.3  CL 106   < > 107 103 102 101 100  CO2 17*   < > 16* 19* 21* 22 18*  GLUCOSE 129*   < > 133* 128* 133* 132* 163*  BUN 121*   < > 136* 138* 140* 140* 140*  CREATININE 5.80*   < > 5.67* 5.69* 5.41* 5.60* 5.98*  CALCIUM 8.0*   < > 8.3* 8.5* 8.5* 8.6* 8.6*  MG  --   --  2.0 2.2 2.0  --   --   PHOS 7.8*  --  7.5* 7.7* 8.0* 8.6*  --    < > = values in this interval not displayed.   Liver Function Tests: Recent Labs  Lab 09/16/19 0517 09/16/19 1352 09/17/19 0538 09/18/19 0252 09/19/19 0415 09/20/19 0508 09/21/19 0359  AST 56*  --   --  21 27 18   --   ALT 36  --   --  21 24 21   --   ALKPHOS 44  --   --  52 50 50  --   BILITOT 0.7  --   --  0.3 0.5 0.8  --   PROT 6.2*  --   --  6.1* 6.2* 6.4*  --    ALBUMIN 3.3*   < > 3.2* 3.3* 3.4* 3.5 3.4*   < > = values in this interval not displayed.   No results for input(s): LIPASE, AMYLASE in the last 168 hours. No results for input(s): AMMONIA in the last 168 hours. CBC: Recent Labs  Lab 09/18/19 0252 09/19/19 0415 09/20/19 0508 09/21/19 0359 09/22/19 0349  WBC 11.2* 10.1 9.9 10.7* 10.4  NEUTROABS 8.4* 7.8* 7.5  --   --  HGB 9.2* 10.0* 9.4* 9.4* 9.1*  HCT 29.0* 30.9* 28.0* 29.1* 27.4*  MCV 101.8* 100.0 98.6 98.6 97.2  PLT 117* 119* 125* 140* 131*   Cardiac Enzymes: No results for input(s): CKTOTAL, CKMB, CKMBINDEX, TROPONINI in the last 168 hours. BNP: BNP (last 3 results) No results for input(s): BNP in the last 8760 hours.  ProBNP (last 3 results) No results for input(s): PROBNP in the last 8760 hours.  CBG: No results for input(s): GLUCAP in the last 168 hours.     Signed:  Cristal Ford  [facility] 09/22/2019, 10:15 AM

## 2019-09-22 NOTE — Telephone Encounter (Signed)
That usually comes from primary care physician.  On top of that I have not seen this patient after he is being hospitalized.  Request for palliative care should come for last personal history in this patient especially since there was some significant critical issues going on last time in the hospital.

## 2019-09-22 NOTE — TOC Transition Note (Signed)
Transition of Care [facility]) - CM/SW Discharge Note   Patient Details  Name: Tanner Pearson MRN: [id number] Date of Birth: 1929/06/22  [facility]) CM/SW Contact:  Zenon Mayo, RN Phone Number: 09/22/2019, 12:12 PM   Clinical Narrative:  NCM spoke with patient regarding outpatient palliative services yesterday, he states to talk with daughter, NCM contacted daughter, she states she works with [facility] and yes she would like the outpatient palliative services to be with them.  NCM contacted [facility] and made referral for outpatient palliative services. Patient will also need HHPT  At discharge, NCM offered choice, he states he does not have a preference for [facility] services.  Will need HHPT, HHOT, HHAIDE.  NCM made referral to [place] with [facility], she is able to take referral.  Soc to begin 24 to 48 hrs post dc.  NCM also notified [facility] that patient is for dc today.    Final next level of care: [place] Barriers to Discharge: No Barriers Identified   Patient Goals and CMS Choice Patient states their goals for this hospitalization and ongoing recovery are:: get better CMS Medicare.gov Compare Post Acute Care list provided to:: Patient Choice offered to / list presented to : Patient  Discharge Placement                       Discharge Plan and Services                DME Arranged: Walker rolling DME Agency: [employer] Date DME Agency Contacted: 09/22/19 Time DME Agency Contacted: 1207 Representative spoke with at DME Agency: [place]: PT, OT, Nurse's Aide(outpatient palliative services) [place] Agency: [facility] (formerly [employer]) Date [place]: 09/22/19 Time [place]: 1207 Representative spoke with at [place]: [facility] ([place]) Interventions     Readmission Risk Interventions No flowsheet data found.

## 2019-09-22 NOTE — Progress Notes (Addendum)
Physical Therapy Treatment Patient Details Name: Tanner Pearson MRN: [id number] DOB: Jan 04, 1929 Today's Date: 09/22/2019    History of Present Illness Pt is an 86 y.o. male admitted 09/16/19 with SOB; worked up for acute hypoxic respiratory failure with pulmonary edema in the setting of CHF exacerbation with worsening CKD; found to EF less than 20% and worsening diastolic dysfunction. Tested (-) COVID-19. PMH includes CKD IV, HTN, HLD, CHF, PAD, gout.    PT Comments    Pt tolerated treatment well despite reduced ambulation distance (largely due to pt desire to take a nap). Pt mobilizes at a supervision level with use of RW, no significant increase in work of breathing during mobility. Pt will continue to benefit from PT POC to improve activity tolerance and restore independence in mobility.  Follow Up Recommendations  Home health PT;Supervision/Assistance - 24 hour     Equipment Recommendations  Rolling walker with 5" wheels(pt reports his RW is broken)    Recommendations for Other Services       Precautions / Restrictions Precautions Precautions: Fall Restrictions Weight Bearing Restrictions: No    Mobility  Bed Mobility Overal bed mobility: (pt received sitting edge of bed)                Transfers Overall transfer level: Needs assistance Equipment used: Rolling walker (2 wheeled) Transfers: Sit to/from Stand Sit to Stand: Supervision            Ambulation/Gait Ambulation/Gait assistance: Supervision Gait Distance (Feet): 70 Feet(additional trial of 15') Assistive device: Rolling walker (2 wheeled) Gait Pattern/deviations: Step-through pattern;Trunk flexed Gait velocity: Decreased Gait velocity interpretation: 1.31 - 2.62 ft/sec, indicative of limited community ambulator General Gait Details: pt with slowed step through gait, increased trunk flexion over RW   Stairs             Wheelchair Mobility    Modified Rankin (Stroke Patients Only)        Balance Overall balance assessment: Needs assistance Sitting-balance support: No upper extremity supported;Feet supported Sitting balance-Leahy Scale: Good Sitting balance - Comments: modI   Standing balance support: Bilateral upper extremity supported Standing balance-Leahy Scale: Good Standing balance comment: supervision with UE support of RW                            Cognition Arousal/Alertness: Awake/alert Behavior During Therapy: WFL for tasks assessed/performed Overall Cognitive Status: Within Functional Limits for tasks assessed                                        Exercises      General Comments General comments (skin integrity, edema, etc.): pt on RA, VSS during session      Pertinent Vitals/Pain Pain Assessment: No/denies pain    Home Living                      Prior Function            PT Goals (current goals can now be found in the care plan section) Acute Rehab PT Goals Patient Stated Goal: to go home Progress towards PT goals: Progressing toward goals    Frequency    Min 3X/week      PT Plan Current plan remains appropriate    Co-evaluation              AM-PAC  PT "6 Clicks" Mobility   Outcome Measure  Help needed turning from your back to your side while in a flat bed without using bedrails?: None Help needed moving from lying on your back to sitting on the side of a flat bed without using bedrails?: None Help needed moving to and from a bed to a chair (including a wheelchair)?: A Little Help needed standing up from a chair using your arms (e.g., wheelchair or bedside chair)?: A Little Help needed to walk in hospital room?: A Little Help needed climbing 3-5 steps with a railing? : A Little 6 Click Score: 20    End of Session   Activity Tolerance: Patient tolerated treatment well Patient left: in chair;with call bell/phone within reach Nurse Communication: Mobility status PT Visit  Diagnosis: Other abnormalities of gait and mobility (R26.89);Muscle weakness (generalized) (M62.81)     Time: [id number] PT Time Calculation (min) (ACUTE ONLY): 14 min  Charges:  $Gait Training: 8-22 mins                     Zenaida Niece, PT, DPT Acute Rehabilitation Pager: [phone]    Zenaida Niece 09/22/2019, 12:16 PM

## 2019-09-24 DIAGNOSIS — E785 Hyperlipidemia, unspecified: Secondary | ICD-10-CM | POA: Diagnosis not present

## 2019-09-24 DIAGNOSIS — D696 Thrombocytopenia, unspecified: Secondary | ICD-10-CM | POA: Diagnosis not present

## 2019-09-24 DIAGNOSIS — I248 Other forms of acute ischemic heart disease: Secondary | ICD-10-CM | POA: Diagnosis not present

## 2019-09-24 DIAGNOSIS — N185 Chronic kidney disease, stage 5: Secondary | ICD-10-CM | POA: Diagnosis not present

## 2019-09-24 DIAGNOSIS — I5043 Acute on chronic combined systolic (congestive) and diastolic (congestive) heart failure: Secondary | ICD-10-CM | POA: Diagnosis not present

## 2019-09-24 DIAGNOSIS — Z9181 History of falling: Secondary | ICD-10-CM | POA: Diagnosis not present

## 2019-09-24 DIAGNOSIS — I350 Nonrheumatic aortic (valve) stenosis: Secondary | ICD-10-CM | POA: Diagnosis not present

## 2019-09-24 DIAGNOSIS — E43 Unspecified severe protein-calorie malnutrition: Secondary | ICD-10-CM | POA: Diagnosis not present

## 2019-09-24 DIAGNOSIS — D631 Anemia in chronic kidney disease: Secondary | ICD-10-CM | POA: Diagnosis not present

## 2019-09-24 DIAGNOSIS — Z7902 Long term (current) use of antithrombotics/antiplatelets: Secondary | ICD-10-CM | POA: Diagnosis not present

## 2019-09-24 DIAGNOSIS — I2510 Atherosclerotic heart disease of native coronary artery without angina pectoris: Secondary | ICD-10-CM | POA: Diagnosis not present

## 2019-09-24 DIAGNOSIS — Z7982 Long term (current) use of aspirin: Secondary | ICD-10-CM | POA: Diagnosis not present

## 2019-09-24 DIAGNOSIS — I132 Hypertensive heart and chronic kidney disease with heart failure and with stage 5 chronic kidney disease, or end stage renal disease: Secondary | ICD-10-CM | POA: Diagnosis not present

## 2019-09-24 DIAGNOSIS — K219 Gastro-esophageal reflux disease without esophagitis: Secondary | ICD-10-CM | POA: Diagnosis not present

## 2019-09-24 DIAGNOSIS — I739 Peripheral vascular disease, unspecified: Secondary | ICD-10-CM | POA: Diagnosis not present

## 2019-09-24 DIAGNOSIS — M109 Gout, unspecified: Secondary | ICD-10-CM | POA: Diagnosis not present

## 2019-09-26 DIAGNOSIS — N185 Chronic kidney disease, stage 5: Secondary | ICD-10-CM | POA: Diagnosis not present

## 2019-09-26 DIAGNOSIS — I5043 Acute on chronic combined systolic (congestive) and diastolic (congestive) heart failure: Secondary | ICD-10-CM | POA: Diagnosis not present

## 2019-09-26 DIAGNOSIS — D631 Anemia in chronic kidney disease: Secondary | ICD-10-CM | POA: Diagnosis not present

## 2019-09-26 DIAGNOSIS — I132 Hypertensive heart and chronic kidney disease with heart failure and with stage 5 chronic kidney disease, or end stage renal disease: Secondary | ICD-10-CM | POA: Diagnosis not present

## 2019-09-26 DIAGNOSIS — I2510 Atherosclerotic heart disease of native coronary artery without angina pectoris: Secondary | ICD-10-CM | POA: Diagnosis not present

## 2019-09-26 DIAGNOSIS — I248 Other forms of acute ischemic heart disease: Secondary | ICD-10-CM | POA: Diagnosis not present

## 2019-09-27 ENCOUNTER — Encounter: Payer: Self-pay | Admitting: Cardiology

## 2019-09-27 ENCOUNTER — Ambulatory Visit (INDEPENDENT_AMBULATORY_CARE_PROVIDER_SITE_OTHER): Payer: Medicare Other | Admitting: Cardiology

## 2019-09-27 ENCOUNTER — Other Ambulatory Visit: Payer: Self-pay

## 2019-09-27 VITALS — BP 100/60 | HR 101 | Ht 70.0 in | Wt 135.0 lb

## 2019-09-27 DIAGNOSIS — E782 Mixed hyperlipidemia: Secondary | ICD-10-CM | POA: Diagnosis not present

## 2019-09-27 DIAGNOSIS — R0602 Shortness of breath: Secondary | ICD-10-CM

## 2019-09-27 DIAGNOSIS — I2510 Atherosclerotic heart disease of native coronary artery without angina pectoris: Secondary | ICD-10-CM | POA: Diagnosis not present

## 2019-09-27 DIAGNOSIS — I5042 Chronic combined systolic (congestive) and diastolic (congestive) heart failure: Secondary | ICD-10-CM

## 2019-09-27 DIAGNOSIS — I255 Ischemic cardiomyopathy: Secondary | ICD-10-CM | POA: Diagnosis not present

## 2019-09-27 NOTE — Patient Instructions (Signed)
Medication Instructions:  Your physician recommends that you continue on your current medications as directed. Please refer to the Current Medication list given to you today.  *If you need a refill on your cardiac medications before your next appointment, please call your pharmacy*  Lab Work: None.  If you have labs (blood work) drawn today and your tests are completely normal, you will receive your results only by: . MyChart Message (if you have MyChart) OR . A paper copy in the mail If you have any lab test that is abnormal or we need to change your treatment, we will call you to review the results.  Testing/Procedures: None.   Follow-Up: At [facility], you and your health needs are our priority.  As part of our continuing mission to provide you with exceptional heart care, we have created designated Provider Care Teams.  These Care Teams include your primary Cardiologist (physician) and Advanced Practice Providers (APPs -  Physician Assistants and Nurse Practitioners) who all work together to provide you with the care you need, when you need it.  Your next appointment:   6 week(s)  The format for your next appointment:   In Person  Provider:   Robert Krasowski, MD  Other Instructions   

## 2019-09-27 NOTE — Progress Notes (Signed)
Cardiology Office Note:    Date:  09/27/2019   ID:  Taysom, Zentner 19-Nov-1928, MRN [id number]  PCP:  Ernestene Kiel, MD  Cardiologist:  Jenne Campus, MD    Referring MD: Ernestene Kiel, MD   Chief Complaint  Patient presents with  . Hospitalization Follow-up    History of Present Illness:    Tanner Pearson is a 86 y.o. male with past medical history significant for coronary artery disease, ischemic cardiomyopathy with last estimation of his ejection fraction done just few weeks ago while in the hospital of 20%, chronic kidney failure with usual creatinine between 3.5 and 4, dyslipidemia, nourishment.  Recently he end up going to the hospital because of decompensated congestive heart failure.  He was managed excellently improved quite significantly but still struggling somewhat.  Described to have some fatigue tiredness inability to sleep at night not because of shortness of breath just because it is uncomfortable for him to lay down.  Denies have any swelling of lower extremities, denies have any chest pain, tightness, pressure, burning in the chest.  He is being taken care of by palliative care team.  Past Medical History:  Diagnosis Date  . Acute on chronic combined systolic (congestive) and diastolic (congestive) heart failure ([place])   . CAD (coronary artery disease)   . Chronic kidney disease (CKD), stage IV (severe) ([place])   . Dyslipidemia   . Hyperlipidemia   . Hypertension     Past Surgical History:  Procedure Laterality Date  . AV FISTULA PLACEMENT, RADIOCEPHALIC    . CATARACT EXTRACTION      Current Medications: Current Meds  Medication Sig  . allopurinol (ZYLOPRIM) 100 MG tablet Take 100 mg by mouth daily.  Marland Kitchen ALPRAZolam (XANAX) 0.25 MG tablet Take 1 tablet (0.25 mg total) by mouth daily as needed for anxiety.  Marland Kitchen aspirin EC 81 MG tablet Take 81 mg by mouth daily.  Marland Kitchen atorvastatin (LIPITOR) 80 MG tablet Take 1 tablet (80 mg total) by mouth daily at 6  PM.  . B Complex-C (B-COMPLEX WITH VITAMIN C) tablet Take 1 tablet by mouth daily.  . clopidogrel (PLAVIX) 75 MG tablet Take 1 tablet (75 mg total) by mouth daily.  . isosorbide-hydrALAZINE (BIDIL) 20-37.5 MG tablet Take 1 tablet by mouth 3 (three) times daily.  . Nutritional Supplements (FEEDING SUPPLEMENT, NEPRO CARB STEADY,) LIQD Take 237 mLs by mouth 2 (two) times daily between meals.  . torsemide (DEMADEX) 100 MG tablet Take 1 tablet (100 mg total) by mouth daily.     Allergies:   Patient has no known allergies.   Social History   Socioeconomic History  . Marital status: Married    Spouse name: Not on file  . Number of children: Not on file  . Years of education: Not on file  . Highest education level: Not on file  Occupational History  . Not on file  Tobacco Use  . Smoking status: Former Research scientist (life sciences)  . Smokeless tobacco: Never Used  Substance and Sexual Activity  . Alcohol use: Yes    Comment: occasional beer  . Drug use: No  . Sexual activity: Not on file  Other Topics Concern  . Not on file  Social History Narrative  . Not on file   Social Determinants of Health   Financial Resource Strain:   . Difficulty of Paying Living Expenses: Not on file  Food Insecurity:   . Worried About Charity fundraiser in the Last Year: Not on file  .  Ran Out of Food in the Last Year: Not on file  Transportation Needs:   . Lack of Transportation (Medical): Not on file  . Lack of Transportation (Non-Medical): Not on file  Physical Activity:   . Days of Exercise per Week: Not on file  . Minutes of Exercise per Session: Not on file  Stress:   . Feeling of Stress : Not on file  Social Connections:   . Frequency of Communication with Friends and Family: Not on file  . Frequency of Social Gatherings with Friends and Family: Not on file  . Attends Religious Services: Not on file  . Active Member of Clubs or Organizations: Not on file  . Attends Archivist Meetings: Not on file    . Marital Status: Not on file     Family History: The patient's family history includes Hypertension in his mother; Polycystic kidney disease in his brother and sister; Stroke in his mother. ROS:   Please see the history of present illness.    All 14 point review of systems negative except as described per history of present illness  EKGs/Labs/Other Studies Reviewed:      Recent Labs: 09/20/2019: ALT 21; Magnesium 2.0 09/22/2019: BUN 140; Creatinine, Ser 5.98; Hemoglobin 9.1; Platelets 131; Potassium 4.3; Sodium 138  Recent Lipid Panel No results found for: CHOL, TRIG, HDL, CHOLHDL, VLDL, LDLCALC, LDLDIRECT  Physical Exam:    VS:  BP 100/60 (BP Location: Left Arm, Patient Position: Sitting, Cuff Size: Normal)   Pulse (!) 101   Ht 5\' 10"  (1.778 m)   Wt 135 lb (61.2 kg)   SpO2 98%   BMI 19.37 kg/m     Wt Readings from Last 3 Encounters:  09/27/19 135 lb (61.2 kg)  09/22/19 133 lb 11.2 oz (60.6 kg)  07/10/19 140 lb 3.2 oz (63.6 kg)     GEN:  Well nourished, well developed in no acute distress HEENT: Normal NECK: No JVD; No carotid bruits LYMPHATICS: No lymphadenopathy CARDIAC: RRR, no murmurs, no rubs, no gallops RESPIRATORY:  Clear to auscultation without rales, wheezing or rhonchi  ABDOMEN: Soft, non-tender, non-distended MUSCULOSKELETAL:  No edema; No deformity  SKIN: Warm and dry LOWER EXTREMITIES: no swelling NEUROLOGIC:  Alert and oriented x 3 PSYCHIATRIC:  Normal affect   ASSESSMENT:    1. Chronic combined systolic and diastolic congestive heart failure ([place])   2. Ischemic cardiomyopathy   3. Mixed hyperlipidemia   4. Shortness of breath   5. Coronary artery disease involving native coronary artery of native heart without angina pectoris    PLAN:    In order of problems listed above:  1. Chronic congestive heart failure with severely diminished left ventricle ejection fraction, not a candidate for ACE inhibitor not candidate for ARB not candidate for  Entresto Aldactone secondary to kidney dysfunction.  Another obstacle that we have his blood pressure being low.  He is on equivalent of BiDil which I will continue for now.  Today his systolic blood pressures [id number] mmHg.  He is also on diuretic and appears to be euvolemic on my physical exam today.  He is scheduled to see his primary care physician next week.  I will ask him to do Chem-7 as well as proBNP on him. 2. Mixed dyslipidemia: On high intensity statin he is on Lipitor 80 mg daily which I will continue. 3. Shortness of breath fairly controlled at this moment.  Again he will see his primary care physician next week proBNP as well as  Chem-7 will be done. 4. Coronary artery disease quite advanced and he is already on palliative care.  We will continue supportive care   Medication Adjustments/Labs and Tests Ordered: Current medicines are reviewed at length with the patient today.  Concerns regarding medicines are outlined above.  No orders of the defined types were placed in this encounter.  Medication changes: No orders of the defined types were placed in this encounter.   Signed, Park Liter, MD, [facility] 09/27/2019 10:59 AM    [place]

## 2019-10-02 NOTE — Consult Note (Signed)
[facility]--Rec'd referral from CM Debra at [facility] last week while pt was still an inpatient.  Reached out to dtr [employer] last week after pt discharged home.  She has spoken with pt and all parties are receptive to [employer].  Initial Admission visit to the home is set for Wednesday 10/04/19 at 4pm per family request when the children can be present for this visit.  Thank you for the opportunity to serve patient and family.  Wynetta Fines, RN [phone]

## 2019-10-03 DIAGNOSIS — G4730 Sleep apnea, unspecified: Secondary | ICD-10-CM | POA: Diagnosis not present

## 2019-10-03 DIAGNOSIS — I5042 Chronic combined systolic (congestive) and diastolic (congestive) heart failure: Secondary | ICD-10-CM | POA: Diagnosis not present

## 2019-10-03 DIAGNOSIS — R5383 Other fatigue: Secondary | ICD-10-CM | POA: Diagnosis not present

## 2019-10-03 DIAGNOSIS — I2510 Atherosclerotic heart disease of native coronary artery without angina pectoris: Secondary | ICD-10-CM | POA: Diagnosis not present

## 2019-10-03 DIAGNOSIS — E46 Unspecified protein-calorie malnutrition: Secondary | ICD-10-CM | POA: Diagnosis not present

## 2019-10-03 DIAGNOSIS — R0600 Dyspnea, unspecified: Secondary | ICD-10-CM | POA: Diagnosis not present

## 2019-10-05 DIAGNOSIS — I2510 Atherosclerotic heart disease of native coronary artery without angina pectoris: Secondary | ICD-10-CM | POA: Diagnosis not present

## 2019-10-05 DIAGNOSIS — I132 Hypertensive heart and chronic kidney disease with heart failure and with stage 5 chronic kidney disease, or end stage renal disease: Secondary | ICD-10-CM | POA: Diagnosis not present

## 2019-10-05 DIAGNOSIS — I5043 Acute on chronic combined systolic (congestive) and diastolic (congestive) heart failure: Secondary | ICD-10-CM | POA: Diagnosis not present

## 2019-10-05 DIAGNOSIS — N185 Chronic kidney disease, stage 5: Secondary | ICD-10-CM | POA: Diagnosis not present

## 2019-10-05 DIAGNOSIS — D631 Anemia in chronic kidney disease: Secondary | ICD-10-CM | POA: Diagnosis not present

## 2019-10-05 DIAGNOSIS — I248 Other forms of acute ischemic heart disease: Secondary | ICD-10-CM | POA: Diagnosis not present

## 2019-10-09 DIAGNOSIS — I248 Other forms of acute ischemic heart disease: Secondary | ICD-10-CM | POA: Diagnosis not present

## 2019-10-09 DIAGNOSIS — I2510 Atherosclerotic heart disease of native coronary artery without angina pectoris: Secondary | ICD-10-CM | POA: Diagnosis not present

## 2019-10-09 DIAGNOSIS — I5043 Acute on chronic combined systolic (congestive) and diastolic (congestive) heart failure: Secondary | ICD-10-CM | POA: Diagnosis not present

## 2019-10-09 DIAGNOSIS — I132 Hypertensive heart and chronic kidney disease with heart failure and with stage 5 chronic kidney disease, or end stage renal disease: Secondary | ICD-10-CM | POA: Diagnosis not present

## 2019-10-09 DIAGNOSIS — N185 Chronic kidney disease, stage 5: Secondary | ICD-10-CM | POA: Diagnosis not present

## 2019-10-09 DIAGNOSIS — D631 Anemia in chronic kidney disease: Secondary | ICD-10-CM | POA: Diagnosis not present

## 2019-10-11 DIAGNOSIS — I2510 Atherosclerotic heart disease of native coronary artery without angina pectoris: Secondary | ICD-10-CM | POA: Diagnosis not present

## 2019-10-11 DIAGNOSIS — I5043 Acute on chronic combined systolic (congestive) and diastolic (congestive) heart failure: Secondary | ICD-10-CM | POA: Diagnosis not present

## 2019-10-11 DIAGNOSIS — I132 Hypertensive heart and chronic kidney disease with heart failure and with stage 5 chronic kidney disease, or end stage renal disease: Secondary | ICD-10-CM | POA: Diagnosis not present

## 2019-10-11 DIAGNOSIS — N186 End stage renal disease: Secondary | ICD-10-CM | POA: Diagnosis not present

## 2019-10-11 DIAGNOSIS — D631 Anemia in chronic kidney disease: Secondary | ICD-10-CM | POA: Diagnosis not present

## 2019-10-11 DIAGNOSIS — I509 Heart failure, unspecified: Secondary | ICD-10-CM | POA: Diagnosis not present

## 2019-10-11 DIAGNOSIS — N185 Chronic kidney disease, stage 5: Secondary | ICD-10-CM | POA: Diagnosis not present

## 2019-10-11 DIAGNOSIS — I248 Other forms of acute ischemic heart disease: Secondary | ICD-10-CM | POA: Diagnosis not present

## 2019-10-12 DIAGNOSIS — I5043 Acute on chronic combined systolic (congestive) and diastolic (congestive) heart failure: Secondary | ICD-10-CM | POA: Diagnosis not present

## 2019-10-12 DIAGNOSIS — I2510 Atherosclerotic heart disease of native coronary artery without angina pectoris: Secondary | ICD-10-CM | POA: Diagnosis not present

## 2019-10-12 DIAGNOSIS — I132 Hypertensive heart and chronic kidney disease with heart failure and with stage 5 chronic kidney disease, or end stage renal disease: Secondary | ICD-10-CM | POA: Diagnosis not present

## 2019-10-12 DIAGNOSIS — I248 Other forms of acute ischemic heart disease: Secondary | ICD-10-CM | POA: Diagnosis not present

## 2019-10-12 DIAGNOSIS — N185 Chronic kidney disease, stage 5: Secondary | ICD-10-CM | POA: Diagnosis not present

## 2019-10-12 DIAGNOSIS — D631 Anemia in chronic kidney disease: Secondary | ICD-10-CM | POA: Diagnosis not present

## 2019-10-17 DIAGNOSIS — I770 Arteriovenous fistula, acquired: Secondary | ICD-10-CM | POA: Diagnosis not present

## 2019-10-17 DIAGNOSIS — I130 Hypertensive heart and chronic kidney disease with heart failure and stage 1 through stage 4 chronic kidney disease, or unspecified chronic kidney disease: Secondary | ICD-10-CM | POA: Diagnosis not present

## 2019-10-17 DIAGNOSIS — I350 Nonrheumatic aortic (valve) stenosis: Secondary | ICD-10-CM | POA: Diagnosis not present

## 2019-10-17 DIAGNOSIS — I5042 Chronic combined systolic (congestive) and diastolic (congestive) heart failure: Secondary | ICD-10-CM | POA: Diagnosis not present

## 2019-10-17 DIAGNOSIS — E43 Unspecified severe protein-calorie malnutrition: Secondary | ICD-10-CM | POA: Diagnosis not present

## 2019-10-17 DIAGNOSIS — I255 Ischemic cardiomyopathy: Secondary | ICD-10-CM | POA: Diagnosis not present

## 2019-10-17 DIAGNOSIS — Z87891 Personal history of nicotine dependence: Secondary | ICD-10-CM | POA: Diagnosis not present

## 2019-10-17 DIAGNOSIS — I739 Peripheral vascular disease, unspecified: Secondary | ICD-10-CM | POA: Diagnosis not present

## 2019-10-17 DIAGNOSIS — N184 Chronic kidney disease, stage 4 (severe): Secondary | ICD-10-CM | POA: Diagnosis not present

## 2019-10-17 DIAGNOSIS — E785 Hyperlipidemia, unspecified: Secondary | ICD-10-CM | POA: Diagnosis not present

## 2019-10-17 DIAGNOSIS — K219 Gastro-esophageal reflux disease without esophagitis: Secondary | ICD-10-CM | POA: Diagnosis not present

## 2019-10-17 DIAGNOSIS — M109 Gout, unspecified: Secondary | ICD-10-CM | POA: Diagnosis not present

## 2019-10-18 ENCOUNTER — Telehealth: Payer: Self-pay | Admitting: Cardiology

## 2019-10-18 DIAGNOSIS — I130 Hypertensive heart and chronic kidney disease with heart failure and stage 1 through stage 4 chronic kidney disease, or unspecified chronic kidney disease: Secondary | ICD-10-CM | POA: Diagnosis not present

## 2019-10-18 DIAGNOSIS — Z87891 Personal history of nicotine dependence: Secondary | ICD-10-CM | POA: Diagnosis not present

## 2019-10-18 DIAGNOSIS — I255 Ischemic cardiomyopathy: Secondary | ICD-10-CM | POA: Diagnosis not present

## 2019-10-18 DIAGNOSIS — I5042 Chronic combined systolic (congestive) and diastolic (congestive) heart failure: Secondary | ICD-10-CM | POA: Diagnosis not present

## 2019-10-18 DIAGNOSIS — R404 Transient alteration of awareness: Secondary | ICD-10-CM | POA: Diagnosis not present

## 2019-10-18 DIAGNOSIS — R0902 Hypoxemia: Secondary | ICD-10-CM | POA: Diagnosis not present

## 2019-10-18 DIAGNOSIS — I350 Nonrheumatic aortic (valve) stenosis: Secondary | ICD-10-CM | POA: Diagnosis not present

## 2019-10-18 DIAGNOSIS — N184 Chronic kidney disease, stage 4 (severe): Secondary | ICD-10-CM | POA: Diagnosis not present

## 2019-10-18 DIAGNOSIS — I959 Hypotension, unspecified: Secondary | ICD-10-CM | POA: Diagnosis not present

## 2019-10-18 DIAGNOSIS — R52 Pain, unspecified: Secondary | ICD-10-CM | POA: Diagnosis not present

## 2019-10-18 NOTE — Telephone Encounter (Signed)
New message:     Daughter wants Dr Raliegh Ip to know that pt is now in Hospice.

## 2019-10-24 DIAGNOSIS — I5043 Acute on chronic combined systolic (congestive) and diastolic (congestive) heart failure: Secondary | ICD-10-CM | POA: Diagnosis not present

## 2019-11-14 ENCOUNTER — Ambulatory Visit: Payer: Medicare Other | Admitting: Cardiology

## 2019-11-16 DEATH — deceased

## 2020-09-01 IMAGING — US US RENAL
1 series · 14 of 25 positions shown · non-contrast
Comparison: None.

CLINICAL DATA: Acute renal failure superimposed on stage IV chronic
kidney disease. Congestive heart failure and coronary artery
disease.

EXAM:
RENAL / URINARY TRACT ULTRASOUND COMPLETE

[Series 1: us renal · 14 of 35 slices shown]
[im 1/35]
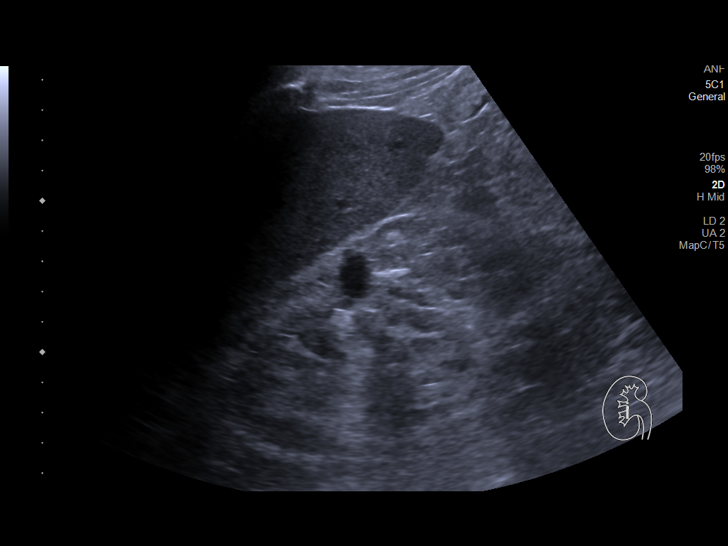
[im 3/35]
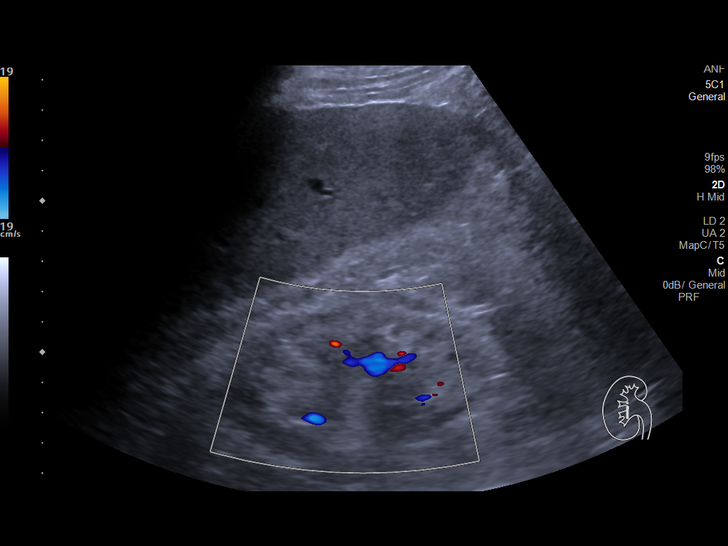
[im 6/35]
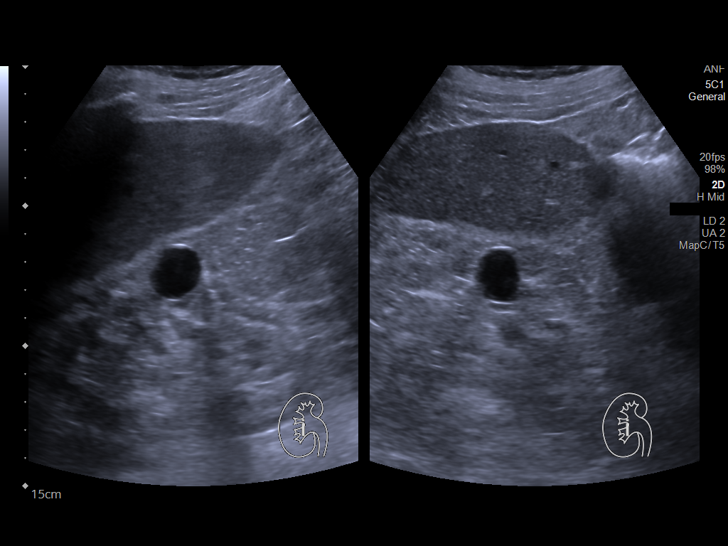
[im 9/35]
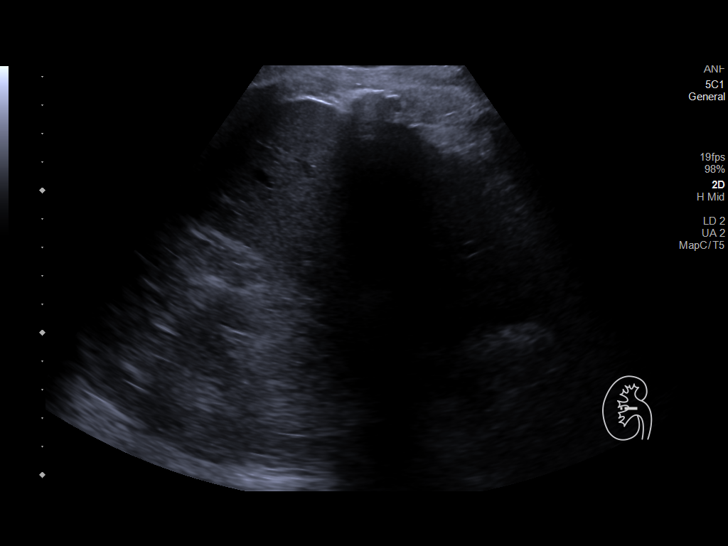
[im 12/35]
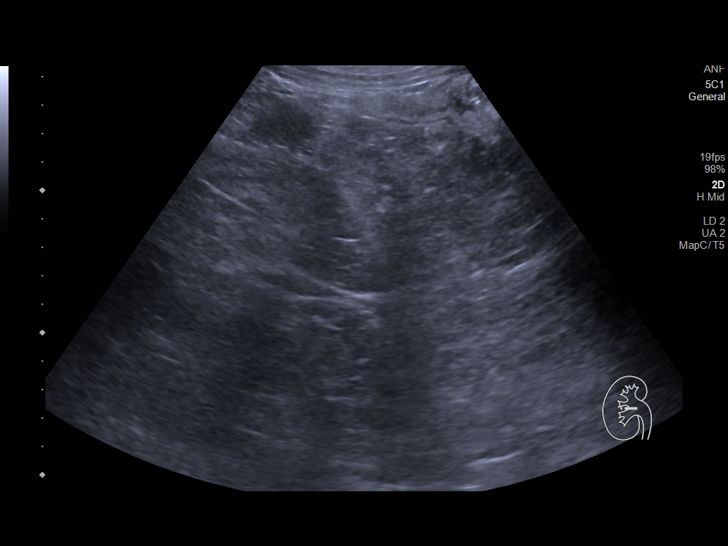
[im 13/35]
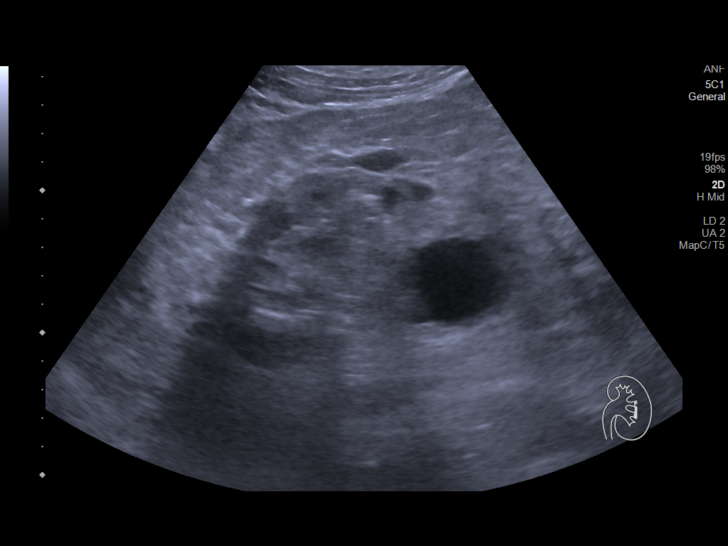
[im 16/35]
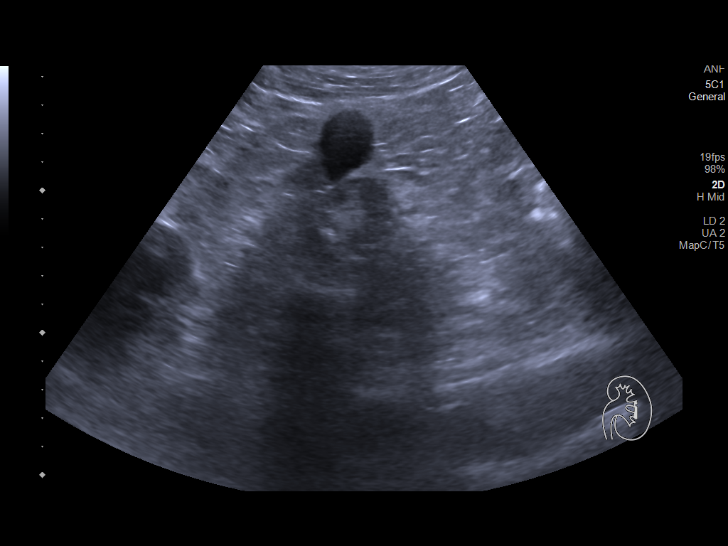
[im 19/35]
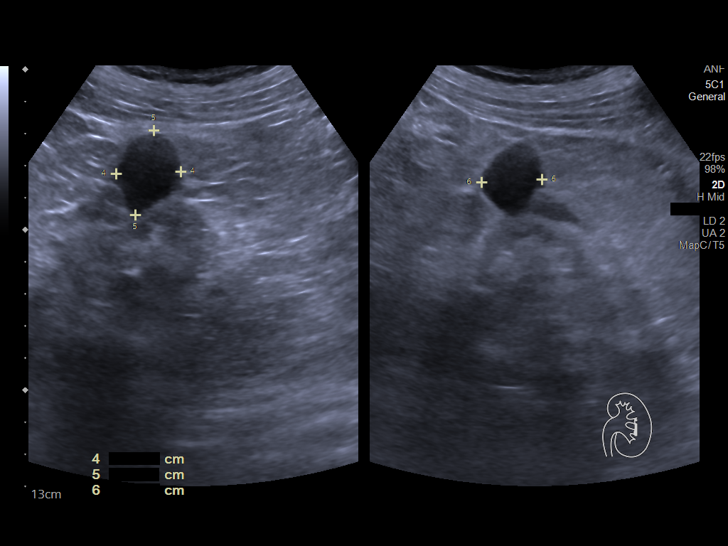
[im 22/35]
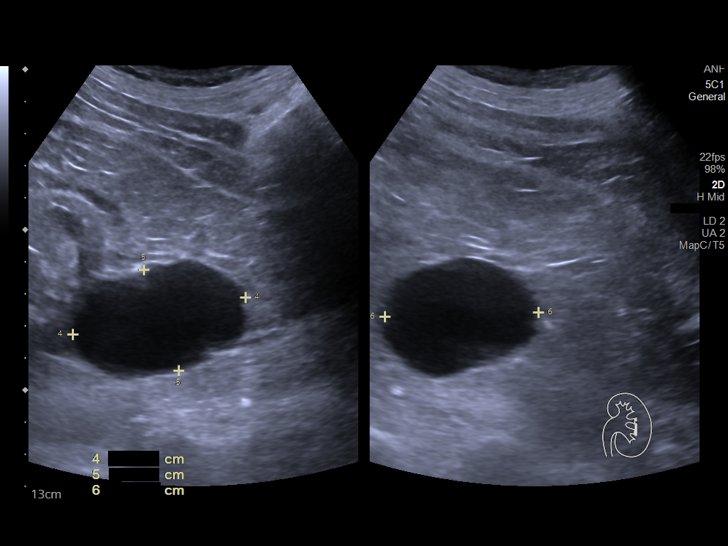
[im 23/35]
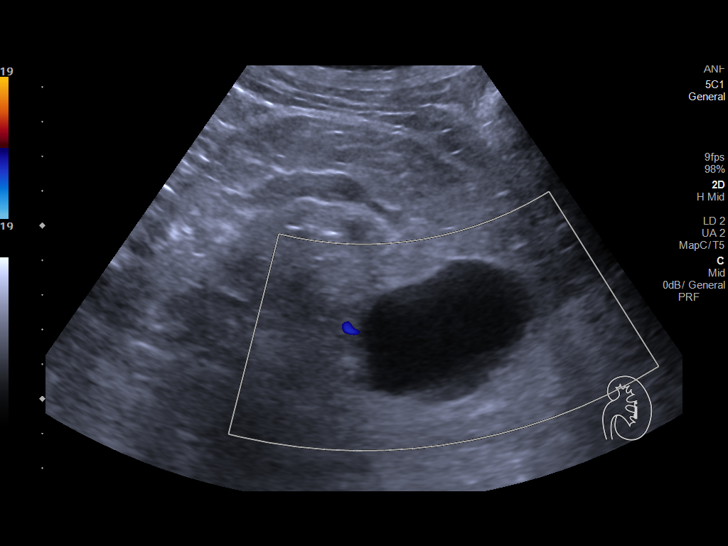
[im 26/35]
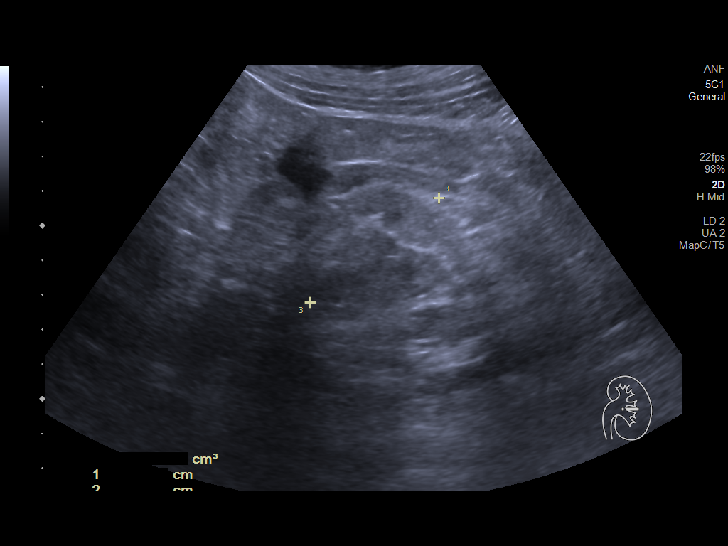
[im 29/35]
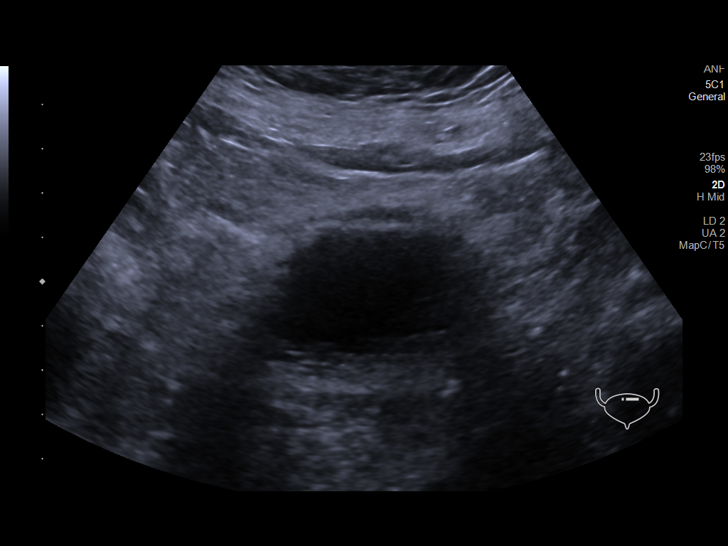
[im 32/35]
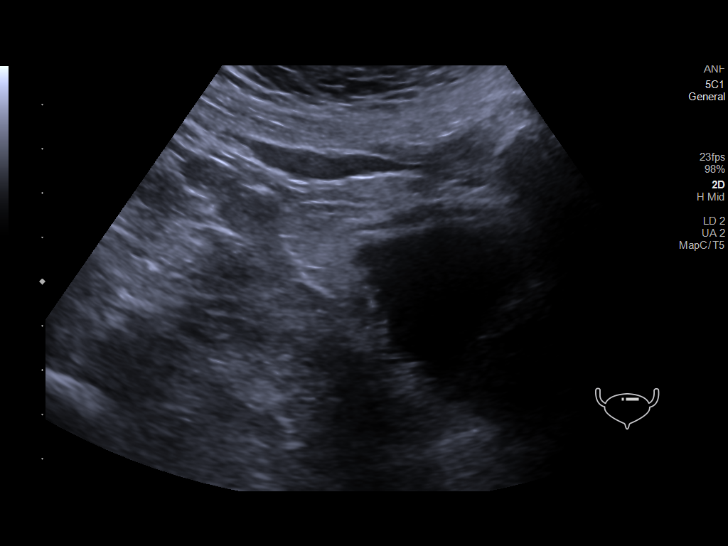
[im 35/35]
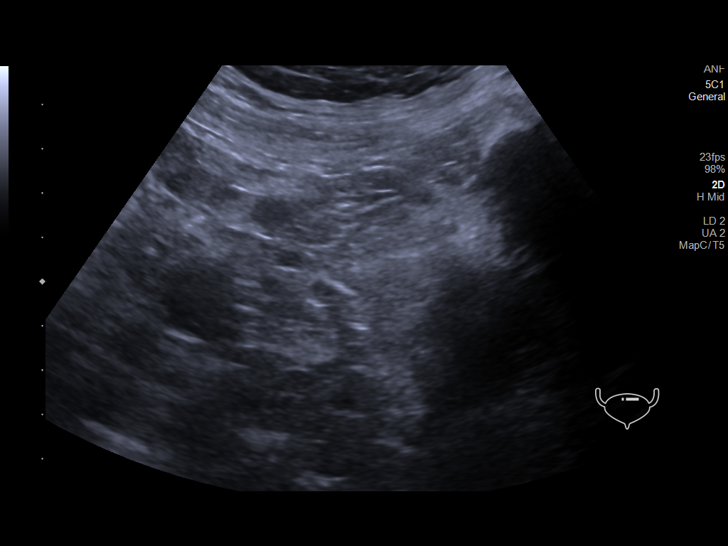

[14 of 25 positions shown; findings below may reference images not displayed]

FINDINGS: Right Kidney:

Renal measurements: 8.4 x 5.3 x 4.6 cm = volume: 107 mL. Diffuse
renal parenchymal thinning and increased echogenicity. A few small
cysts are seen, largest measuring 1.9 cm. No mass or hydronephrosis
visualized.

Left Kidney:

Renal measurements: 9.0 x 5.9 x 4.8 cm = volume: 134 mL. Diffuse
renal parenchymal thinning and increased echogenicity. Several small
cysts are seen, largest measuring 5.5 cm. No mass or hydronephrosis
visualized.

Bladder:

Mild diffuse bladder wall thickening and enlarged prostate gland
noted, likely due to chronic bladder outlet obstruction.

Other:

None.
IMPRESSION: Findings of chronic medical renal disease. No evidence of
hydronephrosis.

Small bilateral renal cysts.  No renal masses identified.

Diffuse bladder wall thickening, likely due to chronic bladder
outlet obstruction given enlarged prostate.

## 2020-09-05 IMAGING — DX DG CHEST 1V PORT
1 series · 1 of 1 positions shown · non-contrast
Comparison: 1 day prior

CLINICAL DATA: Shortness of breath.  Heart failure.  Hypertension.

EXAM:
PORTABLE CHEST 1 VIEW

[chest ap]
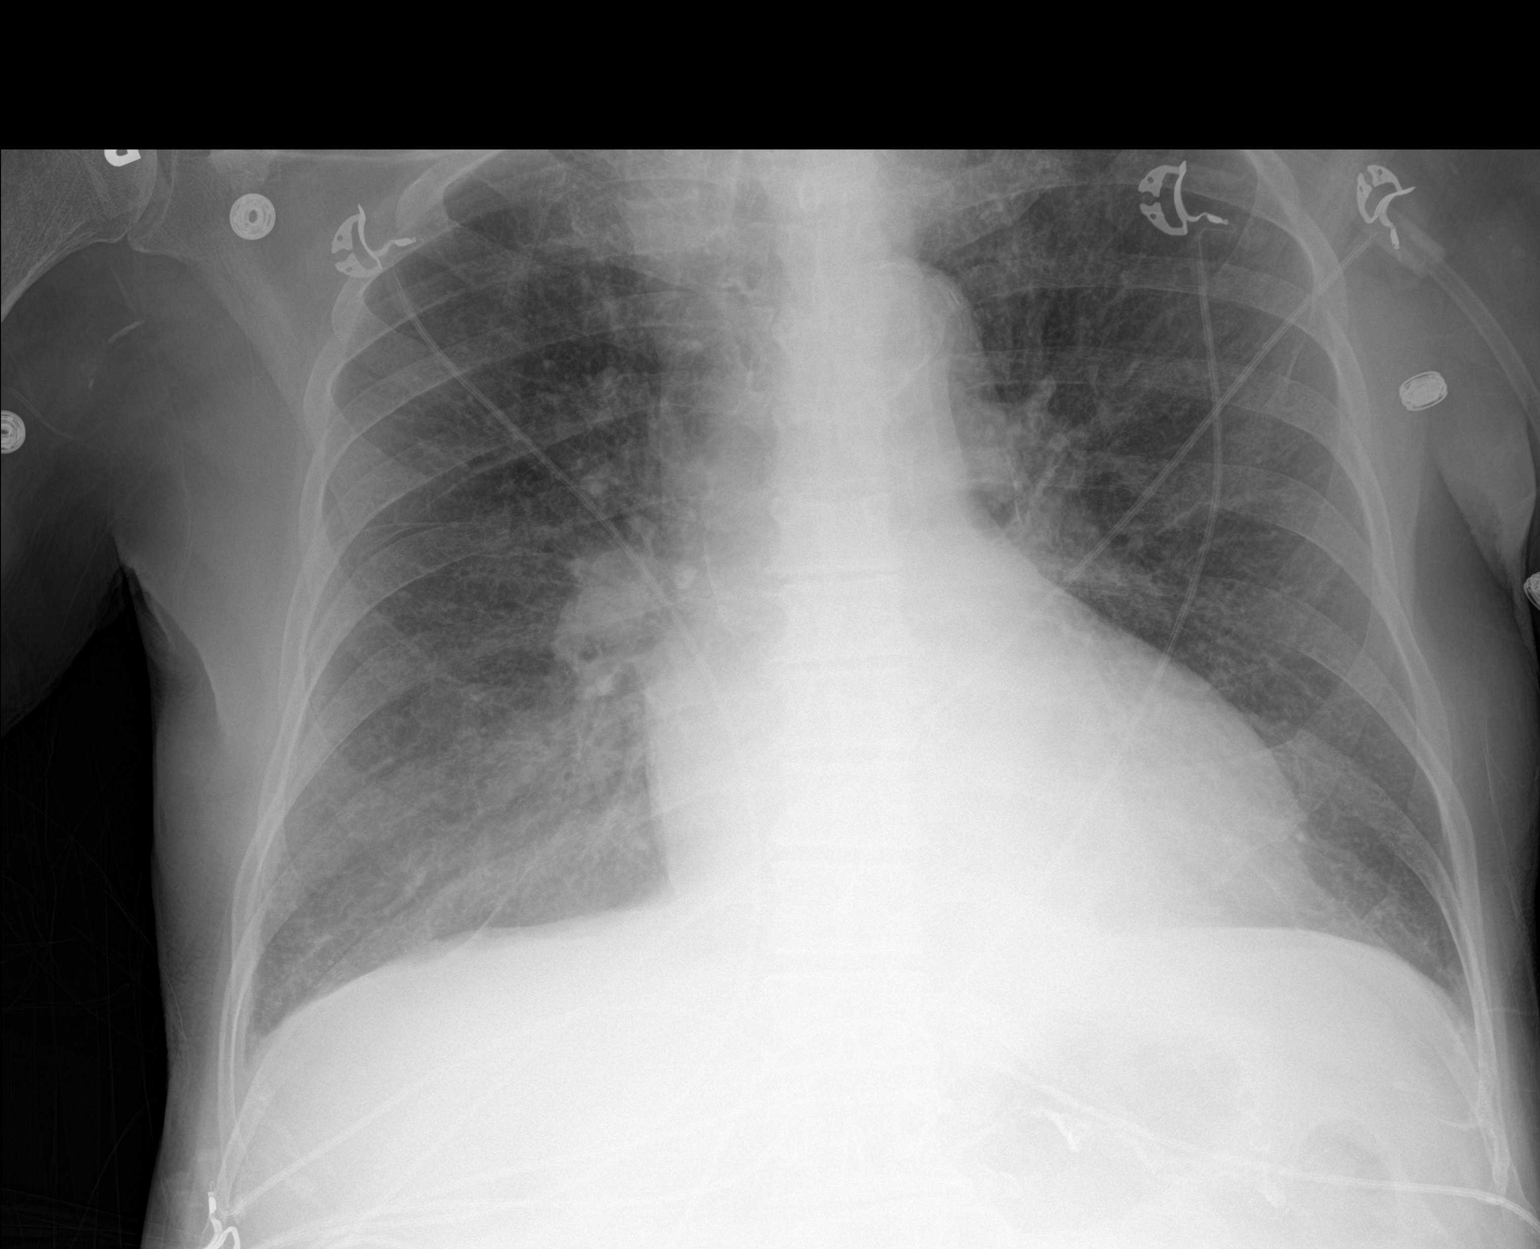

[1 of 1 positions shown; findings below may reference images not displayed]

FINDINGS: Midline trachea. Mild cardiomegaly. Atherosclerosis in the
transverse aorta. Probable layering small bilateral pleural
effusions, similar. No pneumothorax. No congestive failure. Right
greater than left base subsegmental atelectasis. Similar on the
right and slightly improved on the left.
IMPRESSION: Minimally improved left base aeration. Otherwise, similar layering
bilateral pleural effusions and bibasilar atelectasis.

Aortic Atherosclerosis ([id number]).
